# Patient Record
Sex: Female | Born: 1970 | Race: Black or African American | Hispanic: No | Marital: Married | Smoking: Never smoker
Health system: Southern US, Community
[De-identification: ages and names within clinical notes are randomized; demographics above are authoritative.]

## PROBLEM LIST (undated history)

## (undated) DIAGNOSIS — T7840XA Allergy, unspecified, initial encounter: Secondary | ICD-10-CM

## (undated) DIAGNOSIS — F319 Bipolar disorder, unspecified: Secondary | ICD-10-CM

## (undated) DIAGNOSIS — R51 Headache: Secondary | ICD-10-CM

## (undated) DIAGNOSIS — F329 Major depressive disorder, single episode, unspecified: Secondary | ICD-10-CM

## (undated) DIAGNOSIS — E785 Hyperlipidemia, unspecified: Secondary | ICD-10-CM

## (undated) DIAGNOSIS — R7303 Prediabetes: Secondary | ICD-10-CM

## (undated) DIAGNOSIS — K5900 Constipation, unspecified: Secondary | ICD-10-CM

## (undated) DIAGNOSIS — A419 Sepsis, unspecified organism: Secondary | ICD-10-CM

## (undated) DIAGNOSIS — K589 Irritable bowel syndrome without diarrhea: Secondary | ICD-10-CM

## (undated) DIAGNOSIS — M2550 Pain in unspecified joint: Secondary | ICD-10-CM

## (undated) DIAGNOSIS — E559 Vitamin D deficiency, unspecified: Secondary | ICD-10-CM

## (undated) DIAGNOSIS — N390 Urinary tract infection, site not specified: Secondary | ICD-10-CM

## (undated) DIAGNOSIS — N159 Renal tubulo-interstitial disease, unspecified: Secondary | ICD-10-CM

## (undated) DIAGNOSIS — M5124 Other intervertebral disc displacement, thoracic region: Secondary | ICD-10-CM

## (undated) DIAGNOSIS — K219 Gastro-esophageal reflux disease without esophagitis: Secondary | ICD-10-CM

## (undated) DIAGNOSIS — E739 Lactose intolerance, unspecified: Secondary | ICD-10-CM

## (undated) DIAGNOSIS — F419 Anxiety disorder, unspecified: Secondary | ICD-10-CM

## (undated) DIAGNOSIS — J45909 Unspecified asthma, uncomplicated: Secondary | ICD-10-CM

## (undated) DIAGNOSIS — G8929 Other chronic pain: Secondary | ICD-10-CM

## (undated) DIAGNOSIS — F32A Depression, unspecified: Secondary | ICD-10-CM

## (undated) DIAGNOSIS — R0600 Dyspnea, unspecified: Secondary | ICD-10-CM

## (undated) DIAGNOSIS — N189 Chronic kidney disease, unspecified: Secondary | ICD-10-CM

## (undated) DIAGNOSIS — R519 Other chronic pain: Secondary | ICD-10-CM

## (undated) DIAGNOSIS — F988 Other specified behavioral and emotional disorders with onset usually occurring in childhood and adolescence: Secondary | ICD-10-CM

## (undated) HISTORY — DX: Anxiety disorder, unspecified: F41.9

## (undated) HISTORY — DX: Headache: R51

## (undated) HISTORY — DX: Other chronic pain: R51.9

## (undated) HISTORY — DX: Hyperlipidemia, unspecified: E78.5

## (undated) HISTORY — DX: Lactose intolerance, unspecified: E73.9

## (undated) HISTORY — DX: Dyspnea, unspecified: R06.00

## (undated) HISTORY — PX: COLONOSCOPY: SHX174

## (undated) HISTORY — DX: Other intervertebral disc displacement, thoracic region: M51.24

## (undated) HISTORY — DX: Vitamin D deficiency, unspecified: E55.9

## (undated) HISTORY — DX: Depression, unspecified: F32.A

## (undated) HISTORY — PX: BREAST SURGERY: SHX581

## (undated) HISTORY — DX: Bipolar disorder, unspecified: F31.9

## (undated) HISTORY — PX: KIDNEY STONE SURGERY: SHX686

## (undated) HISTORY — DX: Pain in unspecified joint: M25.50

## (undated) HISTORY — PX: TUBAL LIGATION: SHX77

## (undated) HISTORY — PX: LIPOMA EXCISION: SHX5283

## (undated) HISTORY — DX: Other specified behavioral and emotional disorders with onset usually occurring in childhood and adolescence: F98.8

## (undated) HISTORY — DX: Other chronic pain: G89.29

## (undated) HISTORY — DX: Constipation, unspecified: K59.00

## (undated) HISTORY — DX: Allergy, unspecified, initial encounter: T78.40XA

## (undated) HISTORY — DX: Prediabetes: R73.03

## (undated) HISTORY — DX: Chronic kidney disease, unspecified: N18.9

## (undated) HISTORY — PX: BREAST EXCISIONAL BIOPSY: SUR124

## (undated) HISTORY — DX: Major depressive disorder, single episode, unspecified: F32.9

## (undated) HISTORY — DX: Irritable bowel syndrome, unspecified: K58.9

## (undated) HISTORY — PX: OTHER SURGICAL HISTORY: SHX169

## (undated) HISTORY — PX: MOUTH SURGERY: SHX715

---

## 2000-01-19 ENCOUNTER — Other Ambulatory Visit: Admission: RE | Admit: 2000-01-19 | Discharge: 2000-01-19 | Payer: Self-pay | Admitting: *Deleted

## 2001-03-10 ENCOUNTER — Other Ambulatory Visit: Admission: RE | Admit: 2001-03-10 | Discharge: 2001-03-10 | Payer: Self-pay | Admitting: *Deleted

## 2002-04-24 ENCOUNTER — Other Ambulatory Visit: Admission: RE | Admit: 2002-04-24 | Discharge: 2002-04-24 | Payer: Self-pay | Admitting: *Deleted

## 2002-12-21 ENCOUNTER — Other Ambulatory Visit: Admission: RE | Admit: 2002-12-21 | Discharge: 2002-12-21 | Payer: Self-pay | Admitting: Gynecology

## 2003-04-20 HISTORY — PX: LEEP: SHX91

## 2004-04-27 ENCOUNTER — Other Ambulatory Visit: Admission: RE | Admit: 2004-04-27 | Discharge: 2004-04-27 | Payer: Self-pay | Admitting: Gynecology

## 2004-06-30 ENCOUNTER — Ambulatory Visit: Payer: Self-pay | Admitting: Family Medicine

## 2004-08-07 ENCOUNTER — Ambulatory Visit: Payer: Self-pay | Admitting: Family Medicine

## 2004-08-12 ENCOUNTER — Encounter: Admission: RE | Admit: 2004-08-12 | Discharge: 2004-08-12 | Payer: Self-pay | Admitting: Family Medicine

## 2004-12-14 ENCOUNTER — Other Ambulatory Visit: Admission: RE | Admit: 2004-12-14 | Discharge: 2004-12-14 | Payer: Self-pay | Admitting: Gynecology

## 2005-06-30 ENCOUNTER — Emergency Department (HOSPITAL_COMMUNITY): Admission: EM | Admit: 2005-06-30 | Discharge: 2005-06-30 | Payer: Self-pay | Admitting: *Deleted

## 2005-08-12 ENCOUNTER — Ambulatory Visit: Payer: Self-pay | Admitting: Family Medicine

## 2005-12-08 ENCOUNTER — Ambulatory Visit: Payer: Self-pay | Admitting: Family Medicine

## 2006-01-19 ENCOUNTER — Ambulatory Visit: Payer: Self-pay | Admitting: Family Medicine

## 2006-01-25 ENCOUNTER — Ambulatory Visit: Payer: Self-pay | Admitting: Licensed Clinical Social Worker

## 2006-01-31 ENCOUNTER — Ambulatory Visit: Payer: Self-pay | Admitting: Licensed Clinical Social Worker

## 2006-04-06 ENCOUNTER — Other Ambulatory Visit: Admission: RE | Admit: 2006-04-06 | Discharge: 2006-04-06 | Payer: Self-pay | Admitting: Gynecology

## 2006-06-22 ENCOUNTER — Ambulatory Visit: Payer: Self-pay | Admitting: Family Medicine

## 2006-07-01 ENCOUNTER — Ambulatory Visit: Payer: Self-pay | Admitting: Internal Medicine

## 2006-07-19 ENCOUNTER — Ambulatory Visit: Payer: Self-pay | Admitting: Family Medicine

## 2006-07-19 ENCOUNTER — Encounter: Payer: Self-pay | Admitting: Family Medicine

## 2006-07-19 DIAGNOSIS — R110 Nausea: Secondary | ICD-10-CM | POA: Insufficient documentation

## 2006-07-19 DIAGNOSIS — E785 Hyperlipidemia, unspecified: Secondary | ICD-10-CM

## 2006-07-19 DIAGNOSIS — E782 Mixed hyperlipidemia: Secondary | ICD-10-CM | POA: Insufficient documentation

## 2006-08-08 ENCOUNTER — Ambulatory Visit: Payer: Self-pay | Admitting: Gastroenterology

## 2006-08-08 LAB — CONVERTED CEMR LAB
ALT: 13 units/L (ref 0–40)
AST: 23 units/L (ref 0–37)
Basophils Absolute: 0 10*3/uL (ref 0.0–0.1)
CO2: 30 meq/L (ref 19–32)
Chloride: 103 meq/L (ref 96–112)
Eosinophils Absolute: 0.1 10*3/uL (ref 0.0–0.6)
GFR calc Af Amer: 122 mL/min
GFR calc non Af Amer: 101 mL/min
Lymphocytes Relative: 41.4 % (ref 12.0–46.0)
MCHC: 34.1 g/dL (ref 30.0–36.0)
MCV: 87.7 fL (ref 78.0–100.0)
Monocytes Relative: 5.6 % (ref 3.0–11.0)
Platelets: 251 10*3/uL (ref 150–400)
Potassium: 3.7 meq/L (ref 3.5–5.1)
RBC: 4.21 M/uL (ref 3.87–5.11)
RDW: 11.4 % — ABNORMAL LOW (ref 11.5–14.6)
Total Protein: 6.7 g/dL (ref 6.0–8.3)

## 2006-08-15 ENCOUNTER — Encounter: Payer: Self-pay | Admitting: Gastroenterology

## 2006-10-25 ENCOUNTER — Ambulatory Visit: Payer: Self-pay | Admitting: Family Medicine

## 2006-10-25 DIAGNOSIS — F329 Major depressive disorder, single episode, unspecified: Secondary | ICD-10-CM | POA: Insufficient documentation

## 2006-10-25 DIAGNOSIS — F32A Depression, unspecified: Secondary | ICD-10-CM | POA: Insufficient documentation

## 2006-10-28 LAB — CONVERTED CEMR LAB
ALT: 12 units/L (ref 0–35)
AST: 18 units/L (ref 0–37)
Alkaline Phosphatase: 66 units/L (ref 39–117)
Basophils Relative: 0.2 % (ref 0.0–1.0)
Bilirubin, Direct: 0.1 mg/dL (ref 0.0–0.3)
Calcium: 9.4 mg/dL (ref 8.4–10.5)
Direct LDL: 172.5 mg/dL
Eosinophils Absolute: 0.1 10*3/uL (ref 0.0–0.6)
Eosinophils Relative: 1.3 % (ref 0.0–5.0)
GFR calc Af Amer: 91 mL/min
Glucose, Bld: 85 mg/dL (ref 70–99)
Hemoglobin: 13.8 g/dL (ref 12.0–15.0)
MCHC: 34.9 g/dL (ref 30.0–36.0)
MCV: 86 fL (ref 78.0–100.0)
Monocytes Absolute: 0.3 10*3/uL (ref 0.2–0.7)
Monocytes Relative: 5.7 % (ref 3.0–11.0)
Potassium: 3.6 meq/L (ref 3.5–5.1)
Sodium: 142 meq/L (ref 135–145)
Total CHOL/HDL Ratio: 5.2
VLDL: 17 mg/dL (ref 0–40)
WBC: 5 10*3/uL (ref 4.5–10.5)

## 2007-03-05 ENCOUNTER — Emergency Department (HOSPITAL_COMMUNITY): Admission: EM | Admit: 2007-03-05 | Discharge: 2007-03-05 | Payer: Self-pay | Admitting: Emergency Medicine

## 2008-01-15 ENCOUNTER — Emergency Department (HOSPITAL_COMMUNITY): Admission: EM | Admit: 2008-01-15 | Discharge: 2008-01-15 | Payer: Self-pay | Admitting: Family Medicine

## 2008-05-08 ENCOUNTER — Encounter: Payer: Self-pay | Admitting: Family Medicine

## 2008-05-31 ENCOUNTER — Ambulatory Visit: Payer: Self-pay | Admitting: Family Medicine

## 2008-05-31 DIAGNOSIS — R7989 Other specified abnormal findings of blood chemistry: Secondary | ICD-10-CM | POA: Insufficient documentation

## 2008-05-31 DIAGNOSIS — D1739 Benign lipomatous neoplasm of skin and subcutaneous tissue of other sites: Secondary | ICD-10-CM | POA: Insufficient documentation

## 2008-05-31 DIAGNOSIS — Z87442 Personal history of urinary calculi: Secondary | ICD-10-CM | POA: Insufficient documentation

## 2008-06-10 LAB — CONVERTED CEMR LAB
Basophils Absolute: 0 10*3/uL (ref 0.0–0.1)
Basophils Relative: 0.4 % (ref 0.0–3.0)
Iron: 104 ug/dL (ref 42–145)
Lymphocytes Relative: 42.5 % (ref 12.0–46.0)
MCV: 88.4 fL (ref 78.0–100.0)
Monocytes Absolute: 0.3 10*3/uL (ref 0.1–1.0)
Monocytes Relative: 6.5 % (ref 3.0–12.0)
Neutrophils Relative %: 49.4 % (ref 43.0–77.0)
WBC: 4.6 10*3/uL (ref 4.5–10.5)

## 2008-06-11 ENCOUNTER — Encounter (INDEPENDENT_AMBULATORY_CARE_PROVIDER_SITE_OTHER): Payer: Self-pay | Admitting: *Deleted

## 2008-07-01 ENCOUNTER — Encounter: Payer: Self-pay | Admitting: Women's Health

## 2008-07-01 ENCOUNTER — Other Ambulatory Visit: Admission: RE | Admit: 2008-07-01 | Discharge: 2008-07-01 | Payer: Self-pay | Admitting: Gynecology

## 2008-07-01 ENCOUNTER — Ambulatory Visit: Payer: Self-pay | Admitting: Women's Health

## 2008-07-10 ENCOUNTER — Encounter: Admission: RE | Admit: 2008-07-10 | Discharge: 2008-07-10 | Payer: Self-pay | Admitting: Gynecology

## 2008-07-16 ENCOUNTER — Encounter: Admission: RE | Admit: 2008-07-16 | Discharge: 2008-07-16 | Payer: Self-pay | Admitting: Gynecology

## 2008-08-02 ENCOUNTER — Ambulatory Visit: Payer: Self-pay | Admitting: Family Medicine

## 2008-08-02 DIAGNOSIS — IMO0002 Reserved for concepts with insufficient information to code with codable children: Secondary | ICD-10-CM | POA: Insufficient documentation

## 2008-08-05 ENCOUNTER — Telehealth (INDEPENDENT_AMBULATORY_CARE_PROVIDER_SITE_OTHER): Payer: Self-pay | Admitting: *Deleted

## 2008-08-06 ENCOUNTER — Encounter: Admission: RE | Admit: 2008-08-06 | Discharge: 2008-08-06 | Payer: Self-pay | Admitting: Family Medicine

## 2008-08-07 ENCOUNTER — Telehealth (INDEPENDENT_AMBULATORY_CARE_PROVIDER_SITE_OTHER): Payer: Self-pay | Admitting: *Deleted

## 2008-08-14 ENCOUNTER — Encounter: Payer: Self-pay | Admitting: Family Medicine

## 2008-09-24 ENCOUNTER — Telehealth (INDEPENDENT_AMBULATORY_CARE_PROVIDER_SITE_OTHER): Payer: Self-pay | Admitting: *Deleted

## 2008-09-25 ENCOUNTER — Encounter: Payer: Self-pay | Admitting: Family Medicine

## 2008-10-01 ENCOUNTER — Ambulatory Visit: Payer: Self-pay | Admitting: Family Medicine

## 2008-11-12 ENCOUNTER — Ambulatory Visit: Payer: Self-pay | Admitting: Family Medicine

## 2008-11-12 DIAGNOSIS — R5383 Other fatigue: Secondary | ICD-10-CM

## 2008-11-12 DIAGNOSIS — R5381 Other malaise: Secondary | ICD-10-CM | POA: Insufficient documentation

## 2008-11-12 DIAGNOSIS — IMO0001 Reserved for inherently not codable concepts without codable children: Secondary | ICD-10-CM | POA: Insufficient documentation

## 2008-11-17 LAB — CONVERTED CEMR LAB
BUN: 8 mg/dL (ref 6–23)
Basophils Relative: 0.6 % (ref 0.0–3.0)
Creatinine, Ser: 0.8 mg/dL (ref 0.4–1.2)
Eosinophils Absolute: 0.1 10*3/uL (ref 0.0–0.7)
Eosinophils Relative: 1.7 % (ref 0.0–5.0)
Folate: 8.5 ng/mL
Glucose, Bld: 90 mg/dL (ref 70–99)
Hemoglobin: 13 g/dL (ref 12.0–15.0)
Lymphs Abs: 2.2 10*3/uL (ref 0.7–4.0)
Monocytes Relative: 5.8 % (ref 3.0–12.0)
Neutro Abs: 4.1 10*3/uL (ref 1.4–7.7)
Neutrophils Relative %: 59.2 % (ref 43.0–77.0)
Platelets: 253 10*3/uL (ref 150.0–400.0)
RDW: 11.9 % (ref 11.5–14.6)
Sodium: 138 meq/L (ref 135–145)

## 2008-11-20 ENCOUNTER — Telehealth (INDEPENDENT_AMBULATORY_CARE_PROVIDER_SITE_OTHER): Payer: Self-pay | Admitting: *Deleted

## 2008-11-26 ENCOUNTER — Ambulatory Visit: Payer: Self-pay | Admitting: Family Medicine

## 2008-11-28 ENCOUNTER — Encounter (INDEPENDENT_AMBULATORY_CARE_PROVIDER_SITE_OTHER): Payer: Self-pay | Admitting: *Deleted

## 2008-11-28 LAB — CONVERTED CEMR LAB: Sed Rate: 12 mm/hr (ref 0–22)

## 2009-01-06 ENCOUNTER — Ambulatory Visit: Payer: Self-pay | Admitting: Family Medicine

## 2009-01-06 DIAGNOSIS — E663 Overweight: Secondary | ICD-10-CM | POA: Insufficient documentation

## 2009-02-24 ENCOUNTER — Ambulatory Visit: Payer: Self-pay | Admitting: Family Medicine

## 2009-03-03 ENCOUNTER — Emergency Department (HOSPITAL_COMMUNITY): Admission: EM | Admit: 2009-03-03 | Discharge: 2009-03-03 | Payer: Self-pay | Admitting: Family Medicine

## 2009-04-28 ENCOUNTER — Ambulatory Visit: Payer: Self-pay | Admitting: Family Medicine

## 2009-04-28 DIAGNOSIS — F988 Other specified behavioral and emotional disorders with onset usually occurring in childhood and adolescence: Secondary | ICD-10-CM | POA: Insufficient documentation

## 2009-05-21 ENCOUNTER — Telehealth: Payer: Self-pay | Admitting: Family Medicine

## 2009-07-04 ENCOUNTER — Ambulatory Visit: Payer: Self-pay | Admitting: Family Medicine

## 2009-07-09 ENCOUNTER — Encounter: Payer: Self-pay | Admitting: Family Medicine

## 2009-07-09 LAB — CONVERTED CEMR LAB: Pap Smear: NORMAL

## 2009-07-11 ENCOUNTER — Other Ambulatory Visit: Admission: RE | Admit: 2009-07-11 | Discharge: 2009-07-11 | Payer: Self-pay | Admitting: Gynecology

## 2009-07-11 ENCOUNTER — Ambulatory Visit: Payer: Self-pay | Admitting: Women's Health

## 2009-10-29 ENCOUNTER — Telehealth (INDEPENDENT_AMBULATORY_CARE_PROVIDER_SITE_OTHER): Payer: Self-pay | Admitting: *Deleted

## 2010-01-12 ENCOUNTER — Ambulatory Visit: Payer: Self-pay | Admitting: Family Medicine

## 2010-01-12 DIAGNOSIS — B009 Herpesviral infection, unspecified: Secondary | ICD-10-CM | POA: Insufficient documentation

## 2010-03-06 ENCOUNTER — Telehealth: Payer: Self-pay | Admitting: Family Medicine

## 2010-05-10 ENCOUNTER — Encounter: Payer: Self-pay | Admitting: Family Medicine

## 2010-05-13 ENCOUNTER — Telehealth (INDEPENDENT_AMBULATORY_CARE_PROVIDER_SITE_OTHER): Payer: Self-pay | Admitting: *Deleted

## 2010-05-21 NOTE — Progress Notes (Signed)
Summary: Psych's   Phone Note Outgoing Call   Summary of Call: Pt would like a list of Psych's faxed to her- is this okay to do. She is going out of town [address]. I informed pt, i informed her she could pick up her rx tomorrow. Pt is aware. Army Fossa CMA  May 21, 2009 10:24 AM   Follow-up for Phone Call        yes Follow-up by: Loreen Freud DO,  May 21, 2009 10:32 AM  Additional Follow-up for Phone Call Additional follow up Details #1::        faxed list to [phone]. Army Fossa CMA  May 21, 2009 10:33 AM     Prescriptions: ADDERALL XR 20 MG XR24H-CAP (AMPHETAMINE-DEXTROAMPHETAMINE) 1 by mouth qam  #30 x 0   Entered by:   Army Fossa CMA   Authorized by:   Loreen Freud DO   Signed by:   Army Fossa CMA on 05/21/2009   Method used:   Print then Give to Patient   RxID:   [id number]   Appended Document: Psych's  faxed to [phone]

## 2010-05-21 NOTE — Progress Notes (Signed)
Summary: rx  Phone Note Refill Request Call back at Home Phone [phone] Message from:  Patient  Refills Requested: Medication #1:  VYVANSE 30 MG CAPS 1 by mouth once daily. need a new script for vyvanse 30 mg   Initial call taken by: Freddy Jaksch,  May 13, 2010 3:59 PM  Follow-up for Phone Call        spoke with patient and I made her aware a f/u is due now and Adderall is avail, stated she wanted to switch back to Adderall...Marland KitchenMarland KitchenRx printed  Follow-up by: Almeta Monas CMA (AAMA),  May 14, 2010 1:25 PM    Prescriptions: ADDERALL 20 MG TABS (AMPHETAMINE-DEXTROAMPHETAMINE) 1 by mouth two times a day  #60 x 0   Entered by:   Almeta Monas CMA (AAMA)   Authorized by:   Loreen Freud DO   Signed by:   Almeta Monas CMA (AAMA) on 05/14/2010   Method used:   Print then Give to Patient   RxID:   [id number]

## 2010-05-21 NOTE — Assessment & Plan Note (Signed)
Summary: WANTS TO TALK ABOUT MEDICINE///SPH   Vital Signs:  Patient profile:   40 year old female Weight:      188 pounds Temp:     99.6 degrees F oral Pulse rate:   78 / minute Pulse rhythm:   regular BP sitting:   122 / 80  (left arm) Cuff size:   regular  Vitals Entered By: Army Fossa CMA (April 28, 2009 3:11 PM) CC: Discuss medicine- having a hard time focusings. REfill on Adipex.    History of Present Illness: Pt here to discuss Adult ADD.  Pt is having trouble focusing at work.    Her boss even said something to her.      Current Medications (verified): 1)  Adderall Xr 20 Mg Xr24h-Cap (Amphetamine-Dextroamphetamine) .Marland Kitchen.. 1 By Mouth Qam  Allergies: 1)  ! Codeine  Past History:  Past medical, surgical, family and social histories (including risk factors) reviewed for relevance to current acute and chronic problems.  Past Medical History: Reviewed history from 05/31/2008 and no changes required. Hyperlipidemia Depression--[facility]osp [facility]--suicidal ideation  Past Surgical History: Reviewed history from 05/31/2008 and no changes required. Tubal ligation Lumpectomy LEEP 2005  Family History: Reviewed history from 10/25/2006 and no changes required. Family History Hypertension Pgm- cva pAunt- breast CA maunt-stomach ca/ uterine ca maunt-brain ca mgf- diabetes  Social History: Reviewed history from 10/25/2006 and no changes required. 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 Lungs are clear to auscultation, no crackles or wheezes. Heart:  Normal rate and regular rhythm. S1 and S2 normal without gallop, murmur, click, rub or other extra  sounds. Psych:  Cognition and judgment appear intact. Alert and cooperative with normal attention span and concentration. 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 1 by mouth qam Prescriptions: ADDERALL XR 20 MG XR24H-CAP (AMPHETAMINE-DEXTROAMPHETAMINE) 1 by mouth qam  #30 x 0   Entered and Authorized by:   Loreen Freud DO   Signed by:   Loreen Freud DO on 04/28/2009   Method used:   Print then Give to Patient   RxID:   [id number]

## 2010-05-21 NOTE — Assessment & Plan Note (Signed)
Summary: FU ON ADDERRAL/KDC   Vital Signs:  Patient profile:   40 year old female Weight:      175 pounds Temp:     98.9 degrees F oral Pulse rate:   80 / minute BP sitting:   100 / 62  (left arm)  Vitals Entered By: Jeremy Johann CMA (July 04, 2009 12:48 PM) CC: f/u med Comments REVIEWED MED LIST, PATIENT AGREED DOSE AND INSTRUCTION CORRECT    History of Present Illness: Pt here f/u ADD.  She would like to change to Adderall immediate release.  No Side effects.     Allergies: 1)  ! Codeine  Past History:  Past medical, surgical, family and social histories (including risk factors) reviewed for relevance to current acute and chronic problems.  Past Medical History: Reviewed history from 05/31/2008 and no changes required. Hyperlipidemia Depression--Hosp 1993 [facility]--suicidal ideation  Past Surgical History: Reviewed history from 05/31/2008 and no changes required. Tubal ligation Lumpectomy LEEP 2005  Family History: Reviewed history from 10/25/2006 and no changes required. Family History Hypertension Pgm- cva pAunt- breast CA maunt-stomach ca/ uterine ca maunt-brain ca mgf- diabetes  Social History: Reviewed history from 10/25/2006 and no changes required. 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 Lungs are clear to auscultation, no crackles or wheezes. Heart:  normal rate and no murmur.   Psych:  Cognition and judgment appear intact. Alert and cooperative with normal attention span and concentration. No apparent delusions, illusions, hallucinations   Impression & Recommendations:  Problem # 1:  ADD (ICD-314.00) change to adderall 20 mg 1 by  mouth once daily  rto 6 months or sooner as needed   Complete Medication List: 1)  Adderall 20 Mg Tabs (Amphetamine-dextroamphetamine) .Marland Kitchen.. 1 by mouth two times a day Prescriptions: ADDERALL 20 MG TABS (AMPHETAMINE-DEXTROAMPHETAMINE) 1 by mouth two times a day  #60 x 0   Entered and Authorized by:   Loreen Freud DO   Signed by:   Loreen Freud DO on 07/04/2009   Method used:   Print then Give to Patient   RxID:   [id number]

## 2010-05-21 NOTE — Progress Notes (Signed)
Summary: unable to get med  Phone Note Call from Patient Call back at [facility] Phone [phone]   Caller: Patient Summary of Call: Pt left VM that she has been unable to find ADDERALL 20 MG TABS at any pharmacy since September. Pt would like to know if it would be easier for her to change back to extended release. pls advise..........................Marland KitchenFelecia Deloach CMA  March 06, 2010 12:20 PM   Follow-up for Phone Call        some of the pharmacys don't have XR either ---she needs to call her pharmacy---- may be easier to change med altogether to ritalin or vyvanse Follow-up by: Loreen Freud DO,  March 06, 2010 12:48 PM  Additional Follow-up for Phone Call Additional follow up Details #1::        wants to switch to Vyvanse... Additional Follow-up by: Almeta Monas CMA Duncan Dull),  March 06, 2010 4:50 PM    Additional Follow-up for Phone Call Additional follow up Details #2::    vyvanse 30 mg #30  1 by mouth once daily  Follow-up by: Loreen Freud DO,  March 06, 2010 4:58 PM  New/Updated Medications: VYVANSE 30 MG CAPS (LISDEXAMFETAMINE DIMESYLATE) 1 by mouth once daily Prescriptions: VYVANSE 30 MG CAPS (LISDEXAMFETAMINE DIMESYLATE) 1 by mouth once daily  #30 x 0   Entered by:   Almeta Monas CMA (AAMA)   Authorized by:   Loreen Freud DO   Signed by:   Almeta Monas CMA (AAMA) on 03/06/2010   Method used:   Print then Give to Patient   RxID:   [id number]

## 2010-05-21 NOTE — Letter (Signed)
Summary: ADHD Checklist/[facility]  ADHD Checklist/[place] [facility]   Imported By: Lanelle Bal 05/06/2009 09:18:13  _____________________________________________________________________  External Attachment:    Type:   Image     Comment:   External Document

## 2010-05-21 NOTE — Progress Notes (Signed)
Summary: Refill Request  Phone Note Refill Request Call back at [phone] Message from:  Patient on October 29, 2009 12:52 PM  Refills Requested: Medication #1:  ADDERALL 20 MG TABS 1 by mouth two times a day.   Dosage confirmed as above?Dosage Confirmed   Supply Requested: 1 month   Last Refilled: 07/04/2009 Pt can be reached at [phone]   Method Requested: Pick up at Office Initial call taken by: Lavell Islam,  October 29, 2009 12:53 PM  Follow-up for Phone Call        left detail message rx ready for pick-up...........Marland KitchenFelecia Deloach CMA  October 29, 2009 1:18 PM     Prescriptions: ADDERALL 20 MG TABS (AMPHETAMINE-DEXTROAMPHETAMINE) 1 by mouth two times a day  #60 x 0   Entered by:   Jeremy Johann CMA   Authorized by:   Loreen Freud DO   Signed by:   Jeremy Johann CMA on 10/29/2009   Method used:   Print then Give to Patient   RxID:   [id number]

## 2010-05-21 NOTE — Assessment & Plan Note (Signed)
Summary: LIPS SWOLLEN/CDJ   Vital Signs:  Patient profile:   40 year old female Height:      67 inches Weight:      174.4 pounds BMI:     27.41 Temp:     98.7 degrees F oral Pulse rate:   88 / minute Pulse rhythm:   regular BP sitting:   108 / 72  (right arm)  Vitals Entered By: Almeta Monas CMA Duncan Dull) (January 12, 2010 2:45 PM) CC: c/o swollen bottom lip   History of Present Illness: Pt here for refill adderall but c/o sore on lip x several days.    Current Medications (verified): 1)  Adderall 20 Mg Tabs (Amphetamine-Dextroamphetamine) .Marland Kitchen.. 1 By Mouth Two Times A Day  Allergies (verified): 1)  ! Codeine  Physical Exam  General:  Well-developed,well-nourished,in no acute distress; alert,appropriate and cooperative throughout examination Mouth:  L side bottom lip--+ pustule Lungs:  Normal respiratory effort, chest expands symmetrically. Lungs are clear to auscultation, no crackles or wheezes. Heart:  Normal rate and regular rhythm. S1 and S2 normal without gallop, murmur, click, rub or other extra sounds. Psych:  Oriented X3 and normally interactive.     Impression & Recommendations:  Problem # 1:  COLD SORE (ICD-054.9) ? secondary infection---triple abx ointment valtrex three times a day for 10 days  Orders: T-Culture, Herpes (Routine) (17408-14481) T-Culture, Wound (87070/[id number])  Problem # 2:  ADD (ICD-314.00) refill adderall  Complete Medication List: 1)  Adderall 20 Mg Tabs (Amphetamine-dextroamphetamine) .Marland Kitchen.. 1 by mouth two times a day 2)  Valtrex 1 Gm Tabs (Valacyclovir hcl) .Marland Kitchen.. 1 by mouth three times a day Prescriptions: VALTREX 1 GM TABS (VALACYCLOVIR HCL) 1 by mouth three times a day  #30 x 0   Entered and Authorized by:   Loreen Freud DO   Signed by:   Loreen Freud DO on 01/12/2010   Method used:   Electronically to        [facility] [address] [phone]* (retail)       [address]       [address]       [place], [place]  [address]     Ph: [phone]       Fax: [phone]   RxID:   [id number] ADDERALL 20 MG TABS (AMPHETAMINE-DEXTROAMPHETAMINE) 1 by mouth two times a day  #60 x 0   Entered by:   Almeta Monas CMA (AAMA)   Authorized by:   Loreen Freud DO   Signed by:   Almeta Monas CMA (AAMA) on 01/12/2010   Method used:   Print then Give to Patient   RxID:   [id number]

## 2010-06-25 ENCOUNTER — Encounter (INDEPENDENT_AMBULATORY_CARE_PROVIDER_SITE_OTHER): Payer: Managed Care, Other (non HMO) | Admitting: Family Medicine

## 2010-06-25 ENCOUNTER — Encounter: Payer: Self-pay | Admitting: Family Medicine

## 2010-06-25 ENCOUNTER — Other Ambulatory Visit: Payer: Self-pay | Admitting: Family Medicine

## 2010-06-25 DIAGNOSIS — E785 Hyperlipidemia, unspecified: Secondary | ICD-10-CM

## 2010-06-25 DIAGNOSIS — Z0000 Encounter for general adult medical examination without abnormal findings: Secondary | ICD-10-CM

## 2010-06-25 LAB — HEPATIC FUNCTION PANEL
ALT: 10 U/L (ref 0–35)
AST: 18 U/L (ref 0–37)
Bilirubin, Direct: 0.1 mg/dL (ref 0.0–0.3)
Total Bilirubin: 0.7 mg/dL (ref 0.3–1.2)

## 2010-06-25 LAB — LIPID PANEL
Total CHOL/HDL Ratio: 4
VLDL: 9.6 mg/dL (ref 0.0–40.0)

## 2010-06-25 LAB — CBC WITH DIFFERENTIAL/PLATELET
Basophils Absolute: 0 10*3/uL (ref 0.0–0.1)
Eosinophils Absolute: 0 10*3/uL (ref 0.0–0.7)
HCT: 38 % (ref 36.0–46.0)
Lymphocytes Relative: 30.9 % (ref 12.0–46.0)
MCV: 88.7 fl (ref 78.0–100.0)
Monocytes Absolute: 0.2 10*3/uL (ref 0.1–1.0)
Platelets: 252 10*3/uL (ref 150.0–400.0)
RBC: 4.28 Mil/uL (ref 3.87–5.11)
RDW: 12.9 % (ref 11.5–14.6)
WBC: 4.9 10*3/uL (ref 4.5–10.5)

## 2010-06-25 LAB — BASIC METABOLIC PANEL
BUN: 8 mg/dL (ref 6–23)
CO2: 28 mEq/L (ref 19–32)
Creatinine, Ser: 0.7 mg/dL (ref 0.4–1.2)
Glucose, Bld: 92 mg/dL (ref 70–99)
Potassium: 4.1 mEq/L (ref 3.5–5.1)
Sodium: 137 mEq/L (ref 135–145)

## 2010-06-25 LAB — CONVERTED CEMR LAB
Blood in Urine, dipstick: NEGATIVE
Nitrite: NEGATIVE
Urobilinogen, UA: NEGATIVE
WBC Urine, dipstick: NEGATIVE

## 2010-06-25 LAB — TSH: TSH: 1 u[IU]/mL (ref 0.35–5.50)

## 2010-06-25 LAB — LDL CHOLESTEROL, DIRECT: Direct LDL: 144.3 mg/dL

## 2010-06-30 NOTE — Assessment & Plan Note (Signed)
Summary: physical/fasting/kn   Vital Signs:  Patient profile:   40 year old female Height:      67.5 inches Weight:      181.6 pounds Pulse rate:   83 / minute Pulse rhythm:   regular BP sitting:   112 / 74  (right arm) Cuff size:   regular  Vitals Entered By: Almeta Monas CMA Duncan Dull) (June 25, 2010 9:41 AM) CC: CPX/FAsting--No pap   History of Present Illness: Pt here for cpe and labs.  She has gyn--NP Maryelizabeth Rowan.   No complaints.    Preventive Screening-Counseling & Management  Alcohol-Tobacco     Alcohol drinks/day: 0     Smoking Status: never  Caffeine-Diet-Exercise     Caffeine use/day: 1     Does Patient Exercise: yes     Type of exercise: workout dvd     Times/week: <3     Exercise Counseling: to improve exercise regimen  Hep-HIV-STD-Contraception     Dental Visit-last 6 months yes     Dental Care Counseling: not indicated; dental care within six months     SBE monthly: yes     SBE Education/Counseling: not indicated; SBE done regularly  Safety-Violence-Falls     Seat Belt Use: 100  Problems Prior to Update: 1)  Cold Sore  (ICD-054.9) 2)  Add  (ICD-314.00) 3)  Overweight  (ICD-278.02) 4)  Fatigue  (ICD-780.79) 5)  Myalgia  (ICD-729.1) 6)  Back Pain With Radiculopathy  (ICD-729.2) 7)  Other Abnormal Blood Chemistry  (ICD-790.6) 8)  Preventive Health Care  (ICD-V70.0) 9)  Nephrolithiasis, Hx of  (ICD-V13.01) 10)  Hx of Lipoma, Skin  (ICD-214.1) 11)  Preventive Health Care  (ICD-V70.0) 12)  Depression  (ICD-311) 13)  Nausea, Chronic  (ICD-787.02) 14)  Hyperlipidemia  (ICD-272.4)  Medications Prior to Update: 1)  Adderall 20 Mg Tabs (Amphetamine-Dextroamphetamine) .Marland Kitchen.. 1 By Mouth Two Times A Day 2)  Keflex 500 Mg Caps (Cephalexin) .Marland Kitchen.. 1 By Mouth Two Times A Day X10 Days 3)  Diflucan 150 Mg Tabs (Fluconazole) .... By Mouth As Needed 4)  Vyvanse 30 Mg Caps (Lisdexamfetamine Dimesylate) .Marland Kitchen.. 1 By Mouth Once Daily  Current Medications (verified): 1)   Adderall 20 Mg Tabs (Amphetamine-Dextroamphetamine) .Marland Kitchen.. 1 By Mouth Two Times A Day  Allergies (verified): 1)  ! Codeine  Past History:  Past Medical History: Last updated: 05/31/2008 Hyperlipidemia Depression--[facility]osp 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 Family History Hypertension Pgm- cva pAunt- breast CA maunt-stomach ca/ uterine ca maunt-brain ca mgf- diabetes  Social History: Reviewed history from 10/25/2006 and no changes required. 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 Eyes:  Denies blurring, discharge, double vision, eye irritation, eye pain, halos, itching, light sensitivity, red eye, vision loss-1 eye, and vision loss-both eyes;  optho q 1y. ENT:  Denies decreased hearing, difficulty swallowing, ear discharge, earache, hoarseness, nasal congestion, nosebleeds, postnasal drainage, ringing in ears, sinus pressure, and sore throat. CV:  Denies bluish discoloration of lips or nails, chest pain or discomfort, difficulty breathing at night, difficulty breathing while lying down, fainting,  fatigue, leg cramps with exertion, lightheadness, near fainting, palpitations, shortness of breath with exertion, swelling of feet, swelling of hands, and weight gain. Resp:  Denies chest discomfort, chest pain with inspiration, cough, coughing up blood, excessive snoring, hypersomnolence, morning headaches, pleuritic, shortness of breath, sputum productive, and wheezing. GI:  Denies abdominal pain, bloody stools, change in bowel habits, constipation, dark tarry stools, diarrhea, excessive appetite, gas, hemorrhoids, indigestion, loss of appetite, nausea, vomiting, vomiting blood, and yellowish skin color. GU:  Denies abnormal vaginal bleeding, decreased libido, discharge, dysuria, genital sores, hematuria, incontinence, nocturia, urinary frequency, and urinary hesitancy. MS:  Denies joint pain, joint redness, joint swelling, loss of strength, low back pain, mid back pain, muscle aches, muscle , cramps, muscle weakness, stiffness, and thoracic pain. Derm:  Denies changes in color of skin, changes in nail beds, dryness, excessive perspiration, flushing, hair loss, insect bite(s), itching, lesion(s), poor wound healing, and rash. Neuro:  Denies brief paralysis, difficulty with concentration, disturbances in coordination, falling down, headaches, inability to speak, memory loss, numbness, poor balance, seizures, sensation of room spinning, tingling, tremors, visual disturbances, and weakness. Psych:  Denies alternate hallucination ( auditory/visual), anxiety, depression, easily angered, easily tearful, irritability, mental problems, panic attacks, sense of great danger, suicidal thoughts/plans, thoughts of violence, unusual visions or sounds, and thoughts /plans of harming others. Endo:  Denies cold intolerance, excessive hunger, excessive thirst, excessive urination, heat intolerance, polyuria, and weight change. Heme:  Denies abnormal bruising, bleeding, enlarge lymph nodes, fevers, pallor, and skin  discoloration. Allergy:  Denies hives or rash, itching eyes, persistent infections, seasonal allergies, and sneezing.  Physical Exam  General:  Well-developed,well-nourished,in no acute distress; alert,appropriate and cooperative throughout examination Head:  Normocephalic and atraumatic without obvious abnormalities. No apparent alopecia or balding. Eyes:  pupils equal, pupils round, pupils reactive to light, and no injection.   Ears:  External ear exam shows no significant lesions or deformities.  Otoscopic examination reveals clear canals, tympanic membranes are intact bilaterally without bulging, retraction, inflammation or discharge. Hearing is grossly normal bilaterally. Nose:  External nasal examination shows no deformity or inflammation. Nasal mucosa are pink and moist without lesions or exudates. Mouth:  Oral mucosa and oropharynx without lesions or exudates.  Teeth in good repair. Neck:  No deformities, masses, or tenderness noted.no carotid bruits.   Chest Wall:  No deformities, masses, or tenderness noted. Breasts:  gyn Lungs:  Normal respiratory effort, chest expands symmetrically. Lungs are clear to auscultation, no crackles or wheezes. Heart:  normal rate and no murmur.   Abdomen:  Bowel sounds positive,abdomen soft and non-tender without masses, organomegaly or hernias noted. Rectal:  gyn Genitalia:  gyn Msk:  normal ROM, no joint tenderness, no joint swelling, no joint warmth, no redness over joints, no joint deformities, no joint instability, and no crepitation.   Pulses:  R and L carotid,radial,femoral,dorsalis pedis and posterior tibial pulses are full and equal bilaterally Extremities:  No clubbing, cyanosis, edema, or deformity noted with normal full range of motion of all joints.   Neurologic:  No cranial nerve deficits noted. Station and gait are normal. Plantar reflexes are down-going bilaterally. DTRs are symmetrical throughout. Sensory, motor and coordinative  functions appear intact. Skin:  Intact without suspicious lesions or rashes Cervical Nodes:  No lymphadenopathy noted Axillary Nodes:  No palpable lymphadenopathy Psych:  Cognition and judgment appear intact. Alert and cooperative with normal attention span and concentration. No apparent delusions, illusions, hallucinations   Impression & Recommendations:  Problem # 1:  PREVENTIVE HEALTH CARE (ICD-V70.0) ghm utd  Orders: Venipuncture (04540) TLB-Lipid Panel (80061-LIPID) TLB-BMP (Basic Metabolic Panel-BMET) (80048-METABOL) TLB-CBC Platelet - w/Differential (85025-CBCD) TLB-Hepatic/Liver Function Pnl (80076-HEPATIC) TLB-TSH (Thyroid Stimulating Hormone) (84443-TSH) EKG w/ Interpretation (93000) Specimen Handling (98119) UA Dipstick W/ Micro (manual) (81000)  Problem # 2:  ADD (ICD-314.00)  Complete Medication List: 1)  Adderall 20 Mg Tabs (Amphetamine-dextroamphetamine) .Marland Kitchen.. 1 by mouth two times a day   Orders Added: 1)  Venipuncture [36415] 2)  TLB-Lipid Panel [80061-LIPID] 3)  TLB-BMP (Basic Metabolic Panel-BMET) [80048-METABOL] 4)  TLB-CBC Platelet - w/Differential [85025-CBCD] 5)  TLB-Hepatic/Liver Function Pnl [80076-HEPATIC] 6)  TLB-TSH (Thyroid Stimulating Hormone) [84443-TSH] 7)  Est. Patient 40-64 years [99396] 8)  EKG w/ Interpretation [93000] 9)  Specimen Handling [99000] 10)  UA Dipstick W/ Micro (manual) [81000]    Flu Vaccine Next Due:  Refused Last PAP:  Normal (06/22/2006 1:28:16 PM) PAP Result Date:  07/09/2009 PAP Result:  normal PAP Next Due:  1 yr Mammogram Result Date:  08/13/2009 Mammogram Result:  normal--Fibrocystic Mammogram Next Due:  1 yr  Laboratory Results   Urine Tests   Date/Time Reported: June 25, 2010 10:58 AM   Routine Urinalysis   Color: yellow Appearance: Clear Glucose: negative   (Normal Range: Negative) Bilirubin: negative   (Normal Range: Negative) Ketone: negative   (Normal Range: Negative) Spec. Gravity: 1.010    (Normal Range: 1.003-1.035) Blood: negative   (Normal Range: Negative) pH: 7.5   (Normal Range: 5.0-8.0) Protein: trace   (Normal Range: Negative) Urobilinogen: negative   (Normal Range: 0-1) Nitrite: negative   (Normal Range: Negative) Leukocyte Esterace: negative   (Normal Range: Negative)    Comments: Floydene Flock  June 25, 2010 10:58 AM

## 2010-07-22 LAB — CULTURE, ROUTINE-ABSCESS

## 2010-09-04 NOTE — Assessment & Plan Note (Signed)
[facility]                         GASTROENTEROLOGY OFFICE NOTE   Cheryl Patterson, Cheryl Patterson                    MRN:          [id number]  DATE:08/08/2006                            DOB:          08-17-1970    REASON FOR REFERRAL:  Dr. Laury Axon asked me to evaluate Cheryl Patterson in  consultation regarding chronic upper and lower GI symptoms.   HISTORY OF PRESENT ILLNESS:  Cheryl Patterson is a very pleasant 40 year old  women who has had approximately 1-2 years of upper and lower GI  symptoms.  Specifically, she has very occasional every 2-3 month  episodes of abdominal pain she describes as feeling like her stomach is  twisting.  This will be followed by vomiting and diarrhea at the same  time.  This happens every 2-3 months on average.  She is not aware of  upper or lower GI bleeding because she does not really look at it.  The  symptoms last for half an hour or an hour and after she has one bathroom  visit the symptoms are usually gone.  She thought this was lactose  intolerance at first, but then had a more recent episode in December  that she does not recall eating any dairy prior to this.   More recently for the last 2-3 months she has had once weekly nausea or  vomiting and has been fairly constipated.  She moves her bowels  approximately once a week and when she does move them she has to strain,  at first it is hard and then it is followed by very loose stools.  She  tried increasing her dietary fibre, but that has not seemed to make a  difference.  She did try a 30-day colon cleanse program during that and  for about a month afterwards she said her bowels were moving very well.   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   PAST MEDICAL HISTORY:  Elevated cholesterol, anxiety, depression, kidney  stones,  chronic headaches, tubal ligation, breast surgery a cyst was  removed.   CURRENT MEDICATIONS:  1. Nexium once daily, this is a new medicine she has not noticed much      of a difference since being on that.  2. Multivitamin once daily.   ALLERGIES:  CODEINE.   SOCIAL HISTORY:  Nonsmoker, nondrinker, drinks 1-2 large iced coffees  from [employer] on a daily basis.  No other caffeine intake.  She is  married with 9 children between her and her husband.  Works as a  Producer, television/film/video.   FAMILY HISTORY:  Breast cancer runs in her family, alcoholism runs in  her family. No colon cancer, colon polyps in her family.   PHYSICAL EXAMINATION:  5 feet 7 inches, 180 pounds, blood pressure  102/60, pulse 68.  CONSTITUTIONAL:  A generally well appearing.  NEUROLOGIC:  Alert and oriented x3.  EYES:  Extraocular movements intact.  MOUTH:  Oropharynx moist, no lesions.  NECK:  Supple, no lymphadenopathy.  CARDIOVASCULAR:  Heart regular rate and rhythm.  LUNGS:  Clear to auscultation bilaterally.  ABDOMEN:  Soft, nontender, nondistended, normal bowel sounds.  EXTREMITIES:  No lower extremity edema.  SKIN:  No rashes or lesions on visible extremities.   ASSESSMENT/PLAN:  A 40 year old woman with upper and lower  gastrointestinal symptoms.   First for her alternating constipation and diarrhea, this is quite  common and usually is resolved with increasing fibre intake.  She has  tried to do this with changing her diet, but I find fibre supplements  are the most effective way so I am recommending that she be taking 1-2  scoops of Citrucel on a daily basis.  She will get a basic set of labs,  including a complete metabolic profile, thyroid testing and a CBC.  Her  upper GI symptoms of nausea or vomiting possibly may be related to her  rather chronic constipation now and hopefully as we improve her bowel  habits the upper GI symptoms will improve.  She has not noticed any  difference on  the Nexium so she will hold that for now.  She also will  keep a food diary.  I did not note this above, but she did have a  abdominal ultrasound April 2006 and it was essentially normal.  She will  return to see me in 4-5 weeks and sooner if needed.  If any of the lab  testing is significantly out of normal range or if her symptoms do not  improve as her bowels do, then we will have to consider further work up  including imagining studies and/or endoscopy.     Rachael Fee, MD  Electronically Signed    DPJ/MedQ  DD: 08/08/2006  DT: 08/08/2006  Job #: [id number]   cc:   Lelon Perla, DO

## 2010-12-29 ENCOUNTER — Other Ambulatory Visit: Payer: Self-pay | Admitting: Family Medicine

## 2010-12-29 MED ORDER — AMPHETAMINE-DEXTROAMPHETAMINE 20 MG PO TABS: 20.0000 mg | ORAL_TABLET | Freq: Two times a day (BID) | ORAL | Status: DC

## 2010-12-29 NOTE — Telephone Encounter (Signed)
Patient needs prescription for Adderral---hasnt filled it for approx 4-5 months because of work, scheduling, forgetting to call, etc---will Dr Laury Axon approve of new prescription or does she need appt???

## 2010-12-29 NOTE — Telephone Encounter (Signed)
It has been 6 mos since her last Rx and she is due for an appt. Please schedule    KP

## 2010-12-29 NOTE — Telephone Encounter (Signed)
Refill x1   Needs ov for any more 

## 2011-03-05 ENCOUNTER — Encounter: Payer: Self-pay | Admitting: Family Medicine

## 2011-03-05 ENCOUNTER — Ambulatory Visit (INDEPENDENT_AMBULATORY_CARE_PROVIDER_SITE_OTHER): Payer: Managed Care, Other (non HMO) | Admitting: Family Medicine

## 2011-03-05 VITALS — BP 116/82 | HR 82 | Temp 98.8°F | Wt 186.0 lb

## 2011-03-05 DIAGNOSIS — F988 Other specified behavioral and emotional disorders with onset usually occurring in childhood and adolescence: Secondary | ICD-10-CM

## 2011-03-05 MED ORDER — AMPHETAMINE-DEXTROAMPHET ER 30 MG PO CP24: 30.0000 mg | ORAL_CAPSULE | ORAL | Status: DC

## 2011-03-05 MED ORDER — AMPHETAMINE-DEXTROAMPHETAMINE 30 MG PO TABS: 30.0000 mg | ORAL_TABLET | Freq: Two times a day (BID) | ORAL | Status: DC

## 2011-03-05 NOTE — Patient Instructions (Signed)
Attention Deficit Hyperactivity Disorder Attention deficit hyperactivity disorder (ADHD) is a problem with behavior issues based on the way the brain functions (neurobehavioral disorder). It is a common reason for behavior and academic problems in school. CAUSES  The cause of ADHD is unknown in most cases. It may run in families. It sometimes can be associated with learning disabilities and other behavioral problems. SYMPTOMS  There are 3 types of ADHD. The 3 types and some of the symptoms include:  Inattentive   Gets bored or distracted easily.   Loses or forgets things. Forgets to hand in homework.   Has trouble organizing or completing tasks.   Difficulty staying on task.   An inability to organize daily tasks and school work.   Leaving projects, chores, or homework unfinished.   Trouble paying attention or responding to details. Careless mistakes.   Difficulty following directions. Often seems like is not listening.   Dislikes activities that require sustained attention (like chores or homework).   Hyperactive-impulsive   Feels like it is impossible to sit still or stay in a seat. Fidgeting with hands and feet.   Trouble waiting turn.   Talking too much or out of turn. Interruptive.   Speaks or acts impulsively.   Aggressive, disruptive behavior.   Constantly busy or on the go, noisy.   Combined   Has symptoms of both of the above.  Often children with ADHD feel discouraged about themselves and with school. They often perform well below their abilities in school. These symptoms can cause problems in home, school, and in relationships with peers. As children get older, the excess motor activities can calm down, but the problems with paying attention and staying organized persist. Most children do not outgrow ADHD but with good treatment can learn to cope with the symptoms. DIAGNOSIS  When ADHD is suspected, the diagnosis should be made by professionals trained in  ADHD.  Diagnosis will include:  Ruling out other reasons for the child's behavior.   The caregivers will check with the child's school and check their medical records.   They will talk to teachers and parents.   Behavior rating scales for the child will be filled out by those dealing with the child on a daily basis.  A diagnosis is made only after all information has been considered. TREATMENT  Treatment usually includes behavioral treatment often along with medicines. It may include stimulant medicines. The stimulant medicines decrease impulsivity and hyperactivity and increase attention. Other medicines used include antidepressants and certain blood pressure medicines. Most experts agree that treatment for ADHD should address all aspects of the child's functioning. Treatment should not be limited to the use of medicines alone. Treatment should include structured classroom management. The parents must receive education to address rewarding good behavior, discipline, and limit-setting. Tutoring or behavioral therapy or both should be available for the child. If untreated, the disorder can have long-term serious effects into adolescence and adulthood. HOME CARE INSTRUCTIONS   Often with ADHD there is a lot of frustration among the family in dealing with the illness. There is often blame and anger that is not warranted. This is a life long illness. There is no way to prevent ADHD. In many cases, because the problem affects the family as a whole, the entire family may need help. A therapist can help the family find better ways to handle the disruptive behaviors and promote change. If the child is young, most of the therapist's work is with the parents. Parents will   learn techniques for coping with and improving their child's behavior. Sometimes only the child with the ADHD needs counseling. Your caregivers can help you make these decisions.   Children with ADHD may need help in organizing. Some  helpful tips include:   Keep routines the same every day from wake-up time to bedtime. Schedule everything. This includes homework and playtime. This should include outdoor and indoor recreation. Keep the schedule on the refrigerator or a bulletin board where it is frequently seen. Mark schedule changes as far in advance as possible.   Have a place for everything and keep everything in its place. This includes clothing, backpacks, and school supplies.   Encourage writing down assignments and bringing home needed books.   Offer your child a well-balanced diet. Breakfast is especially important for school performance. Children should avoid drinks with caffeine including:   Soft drinks.   Coffee.   Tea.   However, some older children (adolescents) may find these drinks helpful in improving their attention.   Children with ADHD need consistent rules that they can understand and follow. If rules are followed, give small rewards. Children with ADHD often receive, and expect, criticism. Look for good behavior and praise it. Set realistic goals. Give clear instructions. Look for activities that can foster success and self-esteem. Make time for pleasant activities with your child. Give lots of affection.   Parents are their children's greatest advocates. Learn as much as possible about ADHD. This helps you become a stronger and better advocate for your child. It also helps you educate your child's teachers and instructors if they feel inadequate in these areas. Parent support groups are often helpful. A national group with local chapters is called CHADD (Children and Adults with Attention Deficit Hyperactivity Disorder).  PROGNOSIS  There is no cure for ADHD. Children with the disorder seldom outgrow it. Many find adaptive ways to accommodate the ADHD as they mature. SEEK MEDICAL CARE IF:  Your child has repeated muscle twitches, cough or speech outbursts.   Your child has sleep problems.   Your  child has a marked loss of appetite.   Your child develops depression.   Your child has new or worsening behavioral problems.   Your child develops dizziness.   Your child has a racing heart.   Your child has stomach pains.   Your child develops headaches.  Document Released: 03/26/2002 Document Revised: 12/16/2010 Document Reviewed: 11/06/2007 [employer] Patient Information 2012 [employer]. 

## 2011-03-05 NOTE — Progress Notes (Signed)
  Subjective:    Patient ID: Cheryl Patterson, female    DOB: [birth year], 40 y.o.   MRN: [id number]  HPI Pt is here to f/u ADD.  Doing ok with current meds but would like to increase med.    No other complaints.    Review of Systems As above    Objective:   Physical Exam  Constitutional: She is oriented to person, place, and time. She appears well-developed and well-nourished.  Cardiovascular: Normal rate and regular rhythm.   Pulmonary/Chest: Effort normal and breath sounds normal.  Neurological: She is alert and oriented to person, place, and time.  Psychiatric: She has a normal mood and affect. Her behavior is normal.          Assessment & Plan:

## 2011-03-07 NOTE — Assessment & Plan Note (Addendum)
Increase dose adderall rto 6 months or sooner prn

## 2011-03-08 ENCOUNTER — Telehealth: Payer: Self-pay | Admitting: Family Medicine

## 2011-03-08 NOTE — Telephone Encounter (Signed)
I made patient aware that I found the Rx Friday afternoon in the room and I took it to check in and she will pick it up in the morning   KP

## 2011-05-06 ENCOUNTER — Telehealth: Payer: Self-pay

## 2011-05-06 MED ORDER — AMPHETAMINE-DEXTROAMPHET ER 20 MG PO CP24: 20.0000 mg | ORAL_CAPSULE | ORAL | Status: DC

## 2011-05-06 NOTE — Telephone Encounter (Signed)
adderall xr 30 mg #30  1 po qd  

## 2011-05-06 NOTE — Telephone Encounter (Signed)
Spoke with patient and she stated she wanted to go back to the time release Adderall 20 XR instead of the 30 mg. She has switched insurances and the Rx should be more affordable for her now. Please advise    KP

## 2011-05-06 NOTE — Telephone Encounter (Signed)
Ok for 20 mg XR per Dr. Laury Axon and Rx has been printed and left at check in.    KP

## 2011-07-08 ENCOUNTER — Ambulatory Visit (INDEPENDENT_AMBULATORY_CARE_PROVIDER_SITE_OTHER): Payer: Managed Care, Other (non HMO) | Admitting: Women's Health

## 2011-07-08 ENCOUNTER — Encounter: Payer: Self-pay | Admitting: Women's Health

## 2011-07-08 ENCOUNTER — Other Ambulatory Visit (HOSPITAL_COMMUNITY)
Admission: RE | Admit: 2011-07-08 | Discharge: 2011-07-08 | Disposition: A | Discharge status: Home / Self Care | Payer: Managed Care, Other (non HMO) | Source: Ambulatory Visit | Attending: Obstetrics and Gynecology | Admitting: Obstetrics and Gynecology

## 2011-07-08 VITALS — BP 114/76 | Ht 67.0 in | Wt 173.0 lb

## 2011-07-08 DIAGNOSIS — Z01419 Encounter for gynecological examination (general) (routine) without abnormal findings: Secondary | ICD-10-CM | POA: Insufficient documentation

## 2011-07-08 DIAGNOSIS — E079 Disorder of thyroid, unspecified: Secondary | ICD-10-CM

## 2011-07-08 DIAGNOSIS — Z113 Encounter for screening for infections with a predominantly sexual mode of transmission: Secondary | ICD-10-CM

## 2011-07-08 NOTE — Patient Instructions (Signed)
Schedule mammogram TODAY!!!!!!Health Maintenance, Females A healthy lifestyle and preventative care can promote health and wellness.  Maintain regular health, dental, and eye exams.   Eat a healthy diet. Foods like vegetables, fruits, whole grains, low-fat dairy products, and lean protein foods contain the nutrients you need without too many calories. Decrease your intake of foods high in solid fats, added sugars, and salt. Get information about a proper diet from your caregiver, if necessary.   Regular physical exercise is one of the most important things you can do for your health. Most adults should get at least 150 minutes of moderate-intensity exercise (any activity that increases your heart rate and causes you to sweat) each week. In addition, most adults need muscle-strengthening exercises on 2 or more days a week.    Maintain a healthy weight. The body mass index (BMI) is a screening tool to identify possible weight problems. It provides an estimate of body fat based on height and weight. Your caregiver can help determine your BMI, and can help you achieve or maintain a healthy weight. For adults 20 years and older:   A BMI below 18.5 is considered underweight.   A BMI of 18.5 to 24.9 is normal.   A BMI of 25 to 29.9 is considered overweight.   A BMI of 30 and above is considered obese.   Maintain normal blood lipids and cholesterol by exercising and minimizing your intake of saturated fat. Eat a balanced diet with plenty of fruits and vegetables. Blood tests for lipids and cholesterol should begin at age 7 and be repeated every 5 years. If your lipid or cholesterol levels are high, you are over 50, or you are a high risk for heart disease, you may need your cholesterol levels checked more frequently.Ongoing high lipid and cholesterol levels should be treated with medicines if diet and exercise are not effective.   If you smoke, find out from your caregiver how to quit. If you do not  use tobacco, do not start.   If you are pregnant, do not drink alcohol. If you are breastfeeding, be very cautious about drinking alcohol. If you are not pregnant and choose to drink alcohol, do not exceed 1 drink per day. One drink is considered to be 12 ounces (355 mL) of beer, 5 ounces (148 mL) of wine, or 1.5 ounces (44 mL) of liquor.   Avoid use of street drugs. Do not share needles with anyone. Ask for help if you need support or instructions about stopping the use of drugs.   High blood pressure causes heart disease and increases the risk of stroke. Blood pressure should be checked at least every 1 to 2 years. Ongoing high blood pressure should be treated with medicines, if weight loss and exercise are not effective.   If you are 74 to 41 years old, ask your caregiver if you should take aspirin to prevent strokes.   Diabetes screening involves taking a blood sample to check your fasting blood sugar level. This should be done once every 3 years, after age 70, if you are within normal weight and without risk factors for diabetes. Testing should be considered at a younger age or be carried out more frequently if you are overweight and have at least 1 risk factor for diabetes.   Breast cancer screening is essential preventative care for women. You should practice "breast self-awareness." This means understanding the normal appearance and feel of your breasts and may include breast self-examination. Any changes detected, no  matter how small, should be reported to a caregiver. Women in their 20s and 30s should have a clinical breast exam (CBE) by a caregiver as part of a regular health exam every 1 to 3 years. After age 24, women should have a CBE every year. Starting at age 47, women should consider having a mammogram (breast X-ray) every year. Women who have a family history of breast cancer should talk to their caregiver about genetic screening. Women at a high risk of breast cancer should talk to  their caregiver about having an MRI and a mammogram every year.   The Pap test is a screening test for cervical cancer. Women should have a Pap test starting at age 54. Between ages 78 and 74, Pap tests should be repeated every 2 years. Beginning at age 61, you should have a Pap test every 3 years as long as the past 3 Pap tests have been normal. If you had a hysterectomy for a problem that was not cancer or a condition that could lead to cancer, then you no longer need Pap tests. If you are between ages 63 and 53, and you have had normal Pap tests going back 10 years, you no longer need Pap tests. If you have had past treatment for cervical cancer or a condition that could lead to cancer, you need Pap tests and screening for cancer for at least 20 years after your treatment. If Pap tests have been discontinued, risk factors (such as a new sexual partner) need to be reassessed to determine if screening should be resumed. Some women have medical problems that increase the chance of getting cervical cancer. In these cases, your caregiver may recommend more frequent screening and Pap tests.   The human papillomavirus (HPV) test is an additional test that may be used for cervical cancer screening. The HPV test looks for the virus that can cause the cell changes on the cervix. The cells collected during the Pap test can be tested for HPV. The HPV test could be used to screen women aged 71 years and older, and should be used in women of any age who have unclear Pap test results. After the age of 46, women should have HPV testing at the same frequency as a Pap test.   Colorectal cancer can be detected and often prevented. Most routine colorectal cancer screening begins at the age of 57 and continues through age 92. However, your caregiver may recommend screening at an earlier age if you have risk factors for colon cancer. On a yearly basis, your caregiver may provide home test kits to check for hidden blood in the  stool. Use of a small camera at the end of a tube, to directly examine the colon (sigmoidoscopy or colonoscopy), can detect the earliest forms of colorectal cancer. Talk to your caregiver about this at age 70, when routine screening begins. Direct examination of the colon should be repeated every 5 to 10 years through age 61, unless early forms of pre-cancerous polyps or small growths are found.   Hepatitis C blood testing is recommended for all people born from [birth year] through 1965 and any individual with known risks for hepatitis C.   Practice safe sex. Use condoms and avoid high-risk sexual practices to reduce the spread of sexually transmitted infections (STIs). Sexually active women aged 71 and younger should be checked for Chlamydia, which is a common sexually transmitted infection. Older women with new or multiple partners should also be tested for Chlamydia. Testing  for other STIs is recommended if you are sexually active and at increased risk.   Osteoporosis is a disease in which the bones lose minerals and strength with aging. This can result in serious bone fractures. The risk of osteoporosis can be identified using a bone density scan. Women ages 80 and over and women at risk for fractures or osteoporosis should discuss screening with their caregivers. Ask your caregiver whether you should be taking a calcium supplement or vitamin D to reduce the rate of osteoporosis.   Menopause can be associated with physical symptoms and risks. Hormone replacement therapy is available to decrease symptoms and risks. You should talk to your caregiver about whether hormone replacement therapy is right for you.   Use sunscreen with a sun protection factor (SPF) of 30 or greater. Apply sunscreen liberally and repeatedly throughout the day. You should seek shade when your shadow is shorter than you. Protect yourself by wearing long sleeves, pants, a wide-brimmed hat, and sunglasses year round, whenever you are  outdoors.   Notify your caregiver of new moles or changes in moles, especially if there is a change in shape or color. Also notify your caregiver if a mole is larger than the size of a pencil eraser.   Stay current with your immunizations.  Document Released: 10/19/2010 Document Revised: 03/25/2011 Document Reviewed: 10/19/2010 [facility] Patient Information 2012 [place], [place].
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
## 2011-07-09 LAB — URINALYSIS W MICROSCOPIC + REFLEX CULTURE
Bacteria, UA: NONE SEEN
Casts: NONE SEEN
Crystals: NONE SEEN
Glucose, UA: NEGATIVE mg/dL
Hgb urine dipstick: NEGATIVE
Leukocytes, UA: NEGATIVE
Nitrite: NEGATIVE
Specific Gravity, Urine: 1.02 (ref 1.005–1.030)
Squamous Epithelial / LPF: NONE SEEN
pH: 6.5 (ref 5.0–8.0)

## 2011-07-09 LAB — HEPATITIS B SURFACE ANTIGEN: Hepatitis B Surface Ag: NEGATIVE

## 2011-07-09 LAB — GC/CHLAMYDIA PROBE AMP, GENITAL
Chlamydia, DNA Probe: NEGATIVE
GC Probe Amp, Genital: NEGATIVE

## 2011-08-16 ENCOUNTER — Telehealth: Payer: Self-pay | Admitting: *Deleted

## 2011-08-16 MED ORDER — AMPHETAMINE-DEXTROAMPHETAMINE 30 MG PO TABS: 30.0000 mg | ORAL_TABLET | Freq: Two times a day (BID) | ORAL | Status: DC

## 2011-08-16 NOTE — Telephone Encounter (Signed)
Ok to switch to 30mg  bid, #60, no refills

## 2011-08-16 NOTE — Telephone Encounter (Signed)
Pt would like to get Refill on her adderall. Pt notes that she has presently been on 20 mg XR but due to cost she would like to go back to 30 med. Last filled 05-06-11, last OV 03-05-11

## 2011-08-16 NOTE — Telephone Encounter (Signed)
Left Pt detail message Rx ready for pickup after 9 am tomorrow. 

## 2011-08-27 ENCOUNTER — Ambulatory Visit (INDEPENDENT_AMBULATORY_CARE_PROVIDER_SITE_OTHER): Payer: Managed Care, Other (non HMO) | Admitting: Family Medicine

## 2011-08-27 ENCOUNTER — Encounter: Payer: Self-pay | Admitting: Family Medicine

## 2011-08-27 ENCOUNTER — Encounter: Payer: Self-pay | Admitting: Gastroenterology

## 2011-08-27 VITALS — BP 98/72 | HR 67 | Temp 98.3°F | Ht 67.0 in | Wt 172.2 lb

## 2011-08-27 DIAGNOSIS — R110 Nausea: Secondary | ICD-10-CM

## 2011-08-27 DIAGNOSIS — Z0000 Encounter for general adult medical examination without abnormal findings: Secondary | ICD-10-CM

## 2011-08-27 DIAGNOSIS — K219 Gastro-esophageal reflux disease without esophagitis: Secondary | ICD-10-CM

## 2011-08-27 DIAGNOSIS — E785 Hyperlipidemia, unspecified: Secondary | ICD-10-CM

## 2011-08-27 DIAGNOSIS — R109 Unspecified abdominal pain: Secondary | ICD-10-CM

## 2011-08-27 DIAGNOSIS — F988 Other specified behavioral and emotional disorders with onset usually occurring in childhood and adolescence: Secondary | ICD-10-CM

## 2011-08-27 DIAGNOSIS — R319 Hematuria, unspecified: Secondary | ICD-10-CM

## 2011-08-27 LAB — POCT URINALYSIS DIPSTICK
Bilirubin, UA: NEGATIVE
Glucose, UA: NEGATIVE
Ketones, UA: NEGATIVE
Leukocytes, UA: NEGATIVE
Nitrite, UA: NEGATIVE
Protein, UA: NEGATIVE
Spec Grav, UA: 1.025
Urobilinogen, UA: 0.2
pH, UA: 6

## 2011-08-27 LAB — BASIC METABOLIC PANEL
BUN: 12 mg/dL (ref 6–23)
Calcium: 9 mg/dL (ref 8.4–10.5)
GFR: 89.74 mL/min (ref >60.00)
Glucose, Bld: 90 mg/dL (ref 70–99)
Potassium: 4 mEq/L (ref 3.5–5.1)

## 2011-08-27 LAB — CBC WITH DIFFERENTIAL/PLATELET
Basophils Absolute: 0 10*3/uL (ref 0.0–0.1)
Basophils Relative: 0.4 % (ref 0.0–3.0)
Eosinophils Absolute: 0.1 10*3/uL (ref 0.0–0.7)
Eosinophils Relative: 1.9 % (ref 0.0–5.0)
HCT: 39.6 % (ref 36.0–46.0)
Hemoglobin: 13.1 g/dL (ref 12.0–15.0)
Lymphocytes Relative: 31.7 % (ref 12.0–46.0)
Lymphs Abs: 1.4 10*3/uL (ref 0.7–4.0)
MCHC: 33.2 g/dL (ref 30.0–36.0)
MCV: 88.9 fl (ref 78.0–100.0)
Monocytes Absolute: 0.3 10*3/uL (ref 0.1–1.0)
Monocytes Relative: 6.2 % (ref 3.0–12.0)
Neutro Abs: 2.6 10*3/uL (ref 1.4–7.7)
Neutrophils Relative %: 59.8 % (ref 43.0–77.0)
Platelets: 246 10*3/uL (ref 150.0–400.0)
RBC: 4.46 Mil/uL (ref 3.87–5.11)
RDW: 12.5 % (ref 11.5–14.6)
WBC: 4.4 10*3/uL — ABNORMAL LOW (ref 4.5–10.5)

## 2011-08-27 LAB — LIPID PANEL
Cholesterol: 191 mg/dL (ref 0–200)
HDL: 48.1 mg/dL (ref >39.00)
LDL Cholesterol: 135 mg/dL — ABNORMAL HIGH (ref 0–99)
Triglycerides: 38 mg/dL (ref 0.0–149.0)
VLDL: 7.6 mg/dL (ref 0.0–40.0)

## 2011-08-27 LAB — HEPATIC FUNCTION PANEL
AST: 17 U/L (ref 0–37)
Total Bilirubin: 0.5 mg/dL (ref 0.3–1.2)

## 2011-08-27 LAB — TSH: TSH: 0.73 u[IU]/mL (ref 0.35–5.50)

## 2011-08-27 LAB — VITAMIN B12: Vitamin B-12: 894 pg/mL (ref 211–911)

## 2011-08-27 MED ORDER — OMEPRAZOLE 40 MG PO CPDR: 40.0000 mg | DELAYED_RELEASE_CAPSULE | Freq: Every day | ORAL | Status: DC

## 2011-08-27 NOTE — Patient Instructions (Signed)
Preventive Care for Adults, Female A healthy lifestyle and preventive care can promote health and wellness. Preventive health guidelines for women include the following key practices.  A routine yearly physical is a good way to check with your caregiver about your health and preventive screening. It is a chance to share any concerns and updates on your health, and to receive a thorough exam.   Visit your dentist for a routine exam and preventive care every 6 months. Brush your teeth twice a day and floss once a day. Good oral hygiene prevents tooth decay and gum disease.   The frequency of eye exams is based on your age, health, family medical history, use of contact lenses, and other factors. Follow your caregiver's recommendations for frequency of eye exams.   Eat a healthy diet. Foods like vegetables, fruits, whole grains, low-fat dairy products, and lean protein foods contain the nutrients you need without too many calories. Decrease your intake of foods high in solid fats, added sugars, and salt. Eat the right amount of calories for you.Get information about a proper diet from your caregiver, if necessary.   Regular physical exercise is one of the most important things you can do for your health. Most adults should get at least 150 minutes of moderate-intensity exercise (any activity that increases your heart rate and causes you to sweat) each week. In addition, most adults need muscle-strengthening exercises on 2 or more days a week.   Maintain a healthy weight. The body mass index (BMI) is a screening tool to identify possible weight problems. It provides an estimate of body fat based on height and weight. Your caregiver can help determine your BMI, and can help you achieve or maintain a healthy weight.For adults 20 years and older:   A BMI below 18.5 is considered underweight.   A BMI of 18.5 to 24.9 is normal.   A BMI of 25 to 29.9 is considered overweight.   A BMI of 30 and above is  considered obese.   Maintain normal blood lipids and cholesterol levels by exercising and minimizing your intake of saturated fat. Eat a balanced diet with plenty of fruit and vegetables. Blood tests for lipids and cholesterol should begin at age 20 and be repeated every 5 years. If your lipid or cholesterol levels are high, you are over 50, or you are at high risk for heart disease, you may need your cholesterol levels checked more frequently.Ongoing high lipid and cholesterol levels should be treated with medicines if diet and exercise are not effective.   If you smoke, find out from your caregiver how to quit. If you do not use tobacco, do not start.   If you are pregnant, do not drink alcohol. If you are breastfeeding, be very cautious about drinking alcohol. If you are not pregnant and choose to drink alcohol, do not exceed 1 drink per day. One drink is considered to be 12 ounces (355 mL) of beer, 5 ounces (148 mL) of wine, or 1.5 ounces (44 mL) of liquor.   Avoid use of street drugs. Do not share needles with anyone. Ask for help if you need support or instructions about stopping the use of drugs.   High blood pressure causes heart disease and increases the risk of stroke. Your blood pressure should be checked at least every 1 to 2 years. Ongoing high blood pressure should be treated with medicines if weight loss and exercise are not effective.   If you are 55 to 41   years old, ask your caregiver if you should take aspirin to prevent strokes.   Diabetes screening involves taking a blood sample to check your fasting blood sugar level. This should be done once every 3 years, after age 45, if you are within normal weight and without risk factors for diabetes. Testing should be considered at a younger age or be carried out more frequently if you are overweight and have at least 1 risk factor for diabetes.   Breast cancer screening is essential preventive care for women. You should practice "breast  self-awareness." This means understanding the normal appearance and feel of your breasts and may include breast self-examination. Any changes detected, no matter how small, should be reported to a caregiver. Women in their 20s and 30s should have a clinical breast exam (CBE) by a caregiver as part of a regular health exam every 1 to 3 years. After age 40, women should have a CBE every year. Starting at age 40, women should consider having a mammography (breast X-ray test) every year. Women who have a family history of breast cancer should talk to their caregiver about genetic screening. Women at a high risk of breast cancer should talk to their caregivers about having magnetic resonance imaging (MRI) and a mammography every year.   The Pap test is a screening test for cervical cancer. A Pap test can show cell changes on the cervix that might become cervical cancer if left untreated. A Pap test is a procedure in which cells are obtained and examined from the lower end of the uterus (cervix).   Women should have a Pap test starting at age 21.   Between ages 21 and 29, Pap tests should be repeated every 2 years.   Beginning at age 30, you should have a Pap test every 3 years as long as the past 3 Pap tests have been normal.   Some women have medical problems that increase the chance of getting cervical cancer. Talk to your caregiver about these problems. It is especially important to talk to your caregiver if a new problem develops soon after your last Pap test. In these cases, your caregiver may recommend more frequent screening and Pap tests.   The above recommendations are the same for women who have or have not gotten the vaccine for human papillomavirus (HPV).   If you had a hysterectomy for a problem that was not cancer or a condition that could lead to cancer, then you no longer need Pap tests. Even if you no longer need a Pap test, a regular exam is a good idea to make sure no other problems are  starting.   If you are between ages 65 and 70, and you have had normal Pap tests going back 10 years, you no longer need Pap tests. Even if you no longer need a Pap test, a regular exam is a good idea to make sure no other problems are starting.   If you have had past treatment for cervical cancer or a condition that could lead to cancer, you need Pap tests and screening for cancer for at least 20 years after your treatment.   If Pap tests have been discontinued, risk factors (such as a new sexual partner) need to be reassessed to determine if screening should be resumed.   The HPV test is an additional test that may be used for cervical cancer screening. The HPV test looks for the virus that can cause the cell changes on the cervix.   The cells collected during the Pap test can be tested for HPV. The HPV test could be used to screen women aged 30 years and older, and should be used in women of any age who have unclear Pap test results. After the age of 30, women should have HPV testing at the same frequency as a Pap test.   Colorectal cancer can be detected and often prevented. Most routine colorectal cancer screening begins at the age of 50 and continues through age 75. However, your caregiver may recommend screening at an earlier age if you have risk factors for colon cancer. On a yearly basis, your caregiver may provide home test kits to check for hidden blood in the stool. Use of a small camera at the end of a tube, to directly examine the colon (sigmoidoscopy or colonoscopy), can detect the earliest forms of colorectal cancer. Talk to your caregiver about this at age 50, when routine screening begins. Direct examination of the colon should be repeated every 5 to 10 years through age 75, unless early forms of pre-cancerous polyps or small growths are found.   Hepatitis C blood testing is recommended for all people born from 1945 through 1965 and any individual with known risks for hepatitis C.    Practice safe sex. Use condoms and avoid high-risk sexual practices to reduce the spread of sexually transmitted infections (STIs). STIs include gonorrhea, chlamydia, syphilis, trichomonas, herpes, HPV, and human immunodeficiency virus (HIV). Herpes, HIV, and HPV are viral illnesses that have no cure. They can result in disability, cancer, and death. Sexually active women aged 25 and younger should be checked for chlamydia. Older women with new or multiple partners should also be tested for chlamydia. Testing for other STIs is recommended if you are sexually active and at increased risk.   Osteoporosis is a disease in which the bones lose minerals and strength with aging. This can result in serious bone fractures. The risk of osteoporosis can be identified using a bone density scan. Women ages 65 and over and women at risk for fractures or osteoporosis should discuss screening with their caregivers. Ask your caregiver whether you should take a calcium supplement or vitamin D to reduce the rate of osteoporosis.   Menopause can be associated with physical symptoms and risks. Hormone replacement therapy is available to decrease symptoms and risks. You should talk to your caregiver about whether hormone replacement therapy is right for you.   Use sunscreen with sun protection factor (SPF) of 30 or more. Apply sunscreen liberally and repeatedly throughout the day. You should seek shade when your shadow is shorter than you. Protect yourself by wearing long sleeves, pants, a wide-brimmed hat, and sunglasses year round, whenever you are outdoors.   Once a month, do a whole body skin exam, using a mirror to look at the skin on your back. Notify your caregiver of new moles, moles that have irregular borders, moles that are larger than a pencil eraser, or moles that have changed in shape or color.   Stay current with required immunizations.   Influenza. You need a dose every fall (or winter). The composition of  the flu vaccine changes each year, so being vaccinated once is not enough.   Pneumococcal polysaccharide. You need 1 to 2 doses if you smoke cigarettes or if you have certain chronic medical conditions. You need 1 dose at age 65 (or older) if you have never been vaccinated.   Tetanus, diphtheria, pertussis (Tdap, Td). Get 1 dose of   Tdap vaccine if you are younger than age 65, are over 65 and have contact with an infant, are a healthcare worker, are pregnant, or simply want to be protected from whooping cough. After that, you need a Td booster dose every 10 years. Consult your caregiver if you have not had at least 3 tetanus and diphtheria-containing shots sometime in your life or have a deep or dirty wound.   HPV. You need this vaccine if you are a woman age 26 or younger. The vaccine is given in 3 doses over 6 months.   Measles, mumps, rubella (MMR). You need at least 1 dose of MMR if you were born in 1957 or later. You may also need a second dose.   Meningococcal. If you are age 19 to 21 and a first-year college student living in a residence hall, or have one of several medical conditions, you need to get vaccinated against meningococcal disease. You may also need additional booster doses.   Zoster (shingles). If you are age 60 or older, you should get this vaccine.   Varicella (chickenpox). If you have never had chickenpox or you were vaccinated but received only 1 dose, talk to your caregiver to find out if you need this vaccine.   Hepatitis A. You need this vaccine if you have a specific risk factor for hepatitis A virus infection or you simply wish to be protected from this disease. The vaccine is usually given as 2 doses, 6 to 18 months apart.   Hepatitis B. You need this vaccine if you have a specific risk factor for hepatitis B virus infection or you simply wish to be protected from this disease. The vaccine is given in 3 doses, usually over 6 months.  Preventive Services /  Frequency Ages 19 to 39  Blood pressure check.** / Every 1 to 2 years.   Lipid and cholesterol check.** / Every 5 years beginning at age 20.   Clinical breast exam.** / Every 3 years for women in their 20s and 30s.   Pap test.** / Every 2 years from ages 21 through 29. Every 3 years starting at age 30 through age 65 or 70 with a history of 3 consecutive normal Pap tests.   HPV screening.** / Every 3 years from ages 30 through ages 65 to 70 with a history of 3 consecutive normal Pap tests.   Hepatitis C blood test.** / For any individual with known risks for hepatitis C.   Skin self-exam. / Monthly.   Influenza immunization.** / Every year.   Pneumococcal polysaccharide immunization.** / 1 to 2 doses if you smoke cigarettes or if you have certain chronic medical conditions.   Tetanus, diphtheria, pertussis (Tdap, Td) immunization. / A one-time dose of Tdap vaccine. After that, you need a Td booster dose every 10 years.   HPV immunization. / 3 doses over 6 months, if you are 26 and younger.   Measles, mumps, rubella (MMR) immunization. / You need at least 1 dose of MMR if you were born in 1957 or later. You may also need a second dose.   Meningococcal immunization. / 1 dose if you are age 19 to 21 and a first-year college student living in a residence hall, or have one of several medical conditions, you need to get vaccinated against meningococcal disease. You may also need additional booster doses.   Varicella immunization.** / Consult your caregiver.   Hepatitis A immunization.** / Consult your caregiver. 2 doses, 6 to 18 months   apart.   Hepatitis B immunization.** / Consult your caregiver. 3 doses usually over 6 months.  Ages 40 to 64  Blood pressure check.** / Every 1 to 2 years.   Lipid and cholesterol check.** / Every 5 years beginning at age 20.   Clinical breast exam.** / Every year after age 40.   Mammogram.** / Every year beginning at age 40 and continuing for as  long as you are in good health. Consult with your caregiver.   Pap test.** / Every 3 years starting at age 30 through age 65 or 70 with a history of 3 consecutive normal Pap tests.   HPV screening.** / Every 3 years from ages 30 through ages 65 to 70 with a history of 3 consecutive normal Pap tests.   Fecal occult blood test (FOBT) of stool. / Every year beginning at age 50 and continuing until age 75. You may not need to do this test if you get a colonoscopy every 10 years.   Flexible sigmoidoscopy or colonoscopy.** / Every 5 years for a flexible sigmoidoscopy or every 10 years for a colonoscopy beginning at age 50 and continuing until age 75.   Hepatitis C blood test.** / For all people born from 1945 through 1965 and any individual with known risks for hepatitis C.   Skin self-exam. / Monthly.   Influenza immunization.** / Every year.   Pneumococcal polysaccharide immunization.** / 1 to 2 doses if you smoke cigarettes or if you have certain chronic medical conditions.   Tetanus, diphtheria, pertussis (Tdap, Td) immunization.** / A one-time dose of Tdap vaccine. After that, you need a Td booster dose every 10 years.   Measles, mumps, rubella (MMR) immunization. / You need at least 1 dose of MMR if you were born in 1957 or later. You may also need a second dose.   Varicella immunization.** / Consult your caregiver.   Meningococcal immunization.** / Consult your caregiver.   Hepatitis A immunization.** / Consult your caregiver. 2 doses, 6 to 18 months apart.   Hepatitis B immunization.** / Consult your caregiver. 3 doses, usually over 6 months.  Ages 65 and over  Blood pressure check.** / Every 1 to 2 years.   Lipid and cholesterol check.** / Every 5 years beginning at age 20.   Clinical breast exam.** / Every year after age 40.   Mammogram.** / Every year beginning at age 40 and continuing for as long as you are in good health. Consult with your caregiver.   Pap test.** /  Every 3 years starting at age 30 through age 65 or 70 with a 3 consecutive normal Pap tests. Testing can be stopped between 65 and 70 with 3 consecutive normal Pap tests and no abnormal Pap or HPV tests in the past 10 years.   HPV screening.** / Every 3 years from ages 30 through ages 65 or 70 with a history of 3 consecutive normal Pap tests. Testing can be stopped between 65 and 70 with 3 consecutive normal Pap tests and no abnormal Pap or HPV tests in the past 10 years.   Fecal occult blood test (FOBT) of stool. / Every year beginning at age 50 and continuing until age 75. You may not need to do this test if you get a colonoscopy every 10 years.   Flexible sigmoidoscopy or colonoscopy.** / Every 5 years for a flexible sigmoidoscopy or every 10 years for a colonoscopy beginning at age 50 and continuing until age 75.   Hepatitis   C blood test.** / For all people born from 1945 through 1965 and any individual with known risks for hepatitis C.   Osteoporosis screening.** / A one-time screening for women ages 65 and over and women at risk for fractures or osteoporosis.   Skin self-exam. / Monthly.   Influenza immunization.** / Every year.   Pneumococcal polysaccharide immunization.** / 1 dose at age 65 (or older) if you have never been vaccinated.   Tetanus, diphtheria, pertussis (Tdap, Td) immunization. / A one-time dose of Tdap vaccine if you are over 65 and have contact with an infant, are a healthcare worker, or simply want to be protected from whooping cough. After that, you need a Td booster dose every 10 years.   Varicella immunization.** / Consult your caregiver.   Meningococcal immunization.** / Consult your caregiver.   Hepatitis A immunization.** / Consult your caregiver. 2 doses, 6 to 18 months apart.   Hepatitis B immunization.** / Check with your caregiver. 3 doses, usually over 6 months.  ** Family history and personal history of risk and conditions may change your caregiver's  recommendations. Document Released: 06/01/2001 Document Revised: 03/25/2011 Document Reviewed: 08/31/2010 [employer] Patient Information 2012 [employer]. 

## 2011-08-27 NOTE — Assessment & Plan Note (Signed)
con't meds  Check labs 

## 2011-08-27 NOTE — Progress Notes (Signed)
  Subjective:     Cheryl Patterson is a 41 y.o. female and is here for a comprehensive physical exam. The patient reports no problems.  History   Social History  . Marital Status: Married    Spouse Name: N/A    Number of Children: N/A  . Years of Education: N/A   Occupational History  . levelor    Social History Main Topics  . Smoking status: Never Smoker   . Smokeless tobacco: Never Used  . Alcohol Use: No  . Drug Use: No  . Sexually Active: No   Other Topics Concern  . Not on file   Social History Narrative   Exercise--walk qd                 Video--  rare   Health Maintenance  Topic Date Due  . Mammogram  07/16/2009  . Influenza Vaccine  01/18/2012  . Pap Smear  07/08/2014  . Tetanus/tdap  08/08/2014    The following portions of the patient's history were reviewed and updated as appropriate: allergies, current medications, past family history, past medical history, past social history, past surgical history and problem list.  Review of Systems Review of Systems  Constitutional: Negative for activity change, appetite change and fatigue.  HENT: Negative for hearing loss, congestion, tinnitus and ear discharge.  dentist -due Eyes: Negative for visual disturbance (see optho q1y -- vision corrected to 20/20 with glasses).  Respiratory: Negative for cough, chest tightness and shortness of breath.   Cardiovascular: Negative for chest pain, palpitations and leg swelling.  Gastrointestinal: Negative for abdominal pain, diarrhea, constipation and abdominal distention.  + nausea Genitourinary: Negative for urgency, frequency, decreased urine volume and difficulty urinating.  Musculoskeletal: Negative for back pain, arthralgias and gait problem.  Skin: Negative for color change, pallor and rash.  Neurological: Negative for dizziness, light-headedness, numbness and headaches.  Hematological: Negative for adenopathy. Does not bruise/bleed easily.  Psychiatric/Behavioral: Negative  for suicidal ideas, confusion, sleep disturbance, self-injury, dysphoric mood, decreased concentration and agitation.       Objective:    BP 98/72  Pulse 67  Temp(Src) 98.3 F (36.8 C) (Oral)  Ht 5\' 7"  (1.702 m)  Wt 172 lb 3.2 oz (78.109 kg)  BMI 26.97 kg/m2  SpO2 98% General appearance: alert, cooperative, appears stated age and no distress Head: Normocephalic, without obvious abnormality, atraumatic Eyes: conjunctivae/corneas clear. PERRL, EOM's intact. Fundi benign. Ears: normal TM's and external ear canals both ears Nose: Nares normal. Septum midline. Mucosa normal. No drainage or sinus tenderness. Throat: lips, mucosa, and tongue normal; teeth and gums normal Neck: no adenopathy, no carotid bruit, no JVD, supple, symmetrical, trachea midline and thyroid not enlarged, symmetric, no tenderness/mass/nodules Back: symmetric, no curvature. ROM normal. No CVA tenderness. Lungs: clear to auscultation bilaterally Breasts: gyn Heart: regular rate and rhythm, S1, S2 normal, no murmur, click, rub or gallop Abdomen: soft, non-tender; bowel sounds normal; no masses,  no organomegaly Pelvic: gyn Extremities: extremities normal, atraumatic, no cyanosis or edema Pulses: 2+ and symmetric Skin: Skin color, texture, turgor normal. No rashes or lesions Lymph nodes: Cervical, supraclavicular, and axillary nodes normal. Neurologic: Grossly normal psych--no anxiety, depression    Assessment:    Healthy female exam.      Plan:  ghm utd Check labs   See After Visit Summary for Counseling Recommendations

## 2011-08-27 NOTE — Assessment & Plan Note (Signed)
-  cont current meds 

## 2011-09-10 ENCOUNTER — Ambulatory Visit: Payer: Managed Care, Other (non HMO) | Admitting: Gastroenterology

## 2011-09-20 ENCOUNTER — Encounter: Payer: Self-pay | Admitting: Gastroenterology

## 2011-09-20 ENCOUNTER — Other Ambulatory Visit: Payer: Managed Care, Other (non HMO)

## 2011-09-20 ENCOUNTER — Ambulatory Visit (INDEPENDENT_AMBULATORY_CARE_PROVIDER_SITE_OTHER): Payer: Managed Care, Other (non HMO) | Admitting: Gastroenterology

## 2011-09-20 VITALS — BP 100/68 | HR 88 | Ht 67.0 in | Wt 176.4 lb

## 2011-09-20 DIAGNOSIS — R194 Change in bowel habit: Secondary | ICD-10-CM

## 2011-09-20 DIAGNOSIS — R109 Unspecified abdominal pain: Secondary | ICD-10-CM

## 2011-09-20 DIAGNOSIS — R198 Other specified symptoms and signs involving the digestive system and abdomen: Secondary | ICD-10-CM

## 2011-09-20 DIAGNOSIS — R110 Nausea: Secondary | ICD-10-CM

## 2011-09-20 MED ORDER — MOVIPREP 100 G PO SOLR: 1.0000 | ORAL | Status: DC

## 2011-09-20 MED ORDER — HYOSCYAMINE SULFATE ER 0.375 MG PO TB12: 375.0000 ug | ORAL_TABLET | Freq: Two times a day (BID) | ORAL | Status: DC

## 2011-09-20 NOTE — Progress Notes (Signed)
HPI: This is a   very pleasant 41 year old woman whom I last saw about 5 years ago.   For about 2-3 years; she has intermittent cramping, lower abdomen.  She has loose stools intermittently.  She has never noticed blood in her cramps.  The cramping is daily.  Cutting back on spicey foods may or may not help.  Cramps last several hours.  Pepto and or imodium will often help.    She can have vomiting if cramping does not subside.  Has been going  She takes imodium for cramps and says this helps.  She saw me in 2008 but never followed up. She had fairly similar complaints as to narrow however constipation seemed to be a bigger issue for her.  Constipation not a big complaint these days.  She has been trying to lose weight, overall down 4 pounds in 5 yearas on our scales here.  Recent CBC, complete metabolic profile was normal   Review of systems: Pertinent positive and negative review of systems were noted in the above HPI section. Complete review of systems was performed and was otherwise normal.    Past Medical History  Diagnosis Date  . Hyperlipidemia   . Chronic headaches   . Depression     Hosp 1993 [facility]---suicidal ideation  . Bipolar depression     Past Surgical History  Procedure Date  . Tubal ligation   . Leep 2005  . Breast surgery     BREAST LUMP REMOVAL ??WHICH BREAST  . Other surgical history     RENAL STONE REMOVAL  . Mouth surgery   . Lipoma excision     Current Outpatient Prescriptions  Medication Sig Dispense Refill  . amphetamine-dextroamphetamine (ADDERALL, 30MG ,) 30 MG tablet Take 1 tablet (30 mg total) by mouth 2 (two) times daily.  60 tablet  0  . omeprazole (PRILOSEC) 40 MG capsule Take 1 capsule (40 mg total) by mouth daily.  30 capsule  3    Allergies as of 09/20/2011 - Review Complete 09/20/2011  Allergen Reaction Noted  . Codeine  10/25/2006    Family History  Problem Relation Age of Onset  . Hypertension    . Stroke Paternal  Grandmother   . Hypertension Paternal Grandmother   . Heart disease Paternal Grandmother   . Breast cancer Paternal Aunt   . Stomach cancer Maternal Aunt   . Colon cancer Maternal Aunt   . Uterine cancer Maternal Aunt   . Brain cancer Maternal Aunt   . Diabetes Maternal Grandfather   . Breast cancer Mother   . Cancer Maternal Uncle     COLON    History   Social History  . Marital Status: Married    Spouse Name: N/A    Number of Children: 2  . Years of Education: N/A   Occupational History  . levelor    Social History Main Topics  . Smoking status: Never Smoker   . Smokeless tobacco: Never Used  . Alcohol Use: No  . Drug Use: No  . Sexually Active: No   Other Topics Concern  . Not on file   Social History Narrative   Exercise--walk qd                 Video--  rare       Physical Exam: BP 100/68  Pulse 88  Ht 5\' 7"  (1.702 m)  Wt 176 lb 6.4 oz (80.015 kg)  BMI 27.63 kg/m2  LMP 08/30/2011 Constitutional: generally well-appearing Psychiatric:  alert and oriented x3 Eyes: extraocular movements intact Mouth: oral pharynx moist, no lesions Neck: supple no lymphadenopathy Cardiovascular: heart regular rate and rhythm Lungs: clear to auscultation bilaterally Abdomen: soft, nontender, nondistended, no obvious ascites, no peritoneal signs, normal bowel sounds Extremities: no lower extremity edema bilaterally Skin: no lesions on visible extremities    Assessment and plan: 41 y.o. female with  lower abdominal cramping, altered bowel habits  First she is using nearly daily and Imodium for cramping in her lower abdomen. I explained to her that this is more of a diarrhea medicine and I have given her a prescription for more typical antispasmodic medicine to be taken twice daily. He can had some constipating effects. I think we should proceed with colonoscopy to check for occult neoplasm, inflammation.

## 2011-09-20 NOTE — Patient Instructions (Signed)
You will be set up for a colonoscopy for abd pains, altered bowels. New prescription called in for twice daily antispasm medicine, use in place of imodium. You will have labs checked today in the basement lab.  Please head down after you check out with the front desk  (celiac sprue panel, stool for ova and parasite). A copy of this information will be made available to Dr. Laury Axon.

## 2011-09-21 LAB — CELIAC PANEL 10
Endomysial Screen: NEGATIVE
Gliadin IgA: 3.4 U/mL (ref <20)
Gliadin IgG: 10.9 U/mL (ref <20)
IgA: 94 mg/dL (ref 69–380)
Tissue Transglut Ab: 9.6 U/mL (ref <20)

## 2011-10-01 ENCOUNTER — Other Ambulatory Visit: Payer: Managed Care, Other (non HMO)

## 2011-10-01 DIAGNOSIS — R109 Unspecified abdominal pain: Secondary | ICD-10-CM

## 2011-10-01 DIAGNOSIS — R110 Nausea: Secondary | ICD-10-CM

## 2011-10-04 LAB — OVA AND PARASITE SCREEN: OP: NONE SEEN

## 2011-10-06 ENCOUNTER — Encounter: Payer: Self-pay | Admitting: Gastroenterology

## 2011-10-06 ENCOUNTER — Ambulatory Visit (AMBULATORY_SURGERY_CENTER): Payer: Managed Care, Other (non HMO) | Admitting: Gastroenterology

## 2011-10-06 VITALS — BP 108/62 | HR 81 | Temp 96.3°F | Resp 16 | Ht 67.0 in | Wt 176.0 lb

## 2011-10-06 DIAGNOSIS — D126 Benign neoplasm of colon, unspecified: Secondary | ICD-10-CM

## 2011-10-06 DIAGNOSIS — R110 Nausea: Secondary | ICD-10-CM

## 2011-10-06 DIAGNOSIS — R198 Other specified symptoms and signs involving the digestive system and abdomen: Secondary | ICD-10-CM

## 2011-10-06 DIAGNOSIS — R109 Unspecified abdominal pain: Secondary | ICD-10-CM

## 2011-10-06 MED ORDER — SODIUM CHLORIDE 0.9 % IV SOLN: 500.0000 mL | INTRAVENOUS | Status: DC

## 2011-10-06 NOTE — Op Note (Signed)
[facility] [address] [place], [place]  [address]  COLONOSCOPY PROCEDURE REPORT  PATIENT:  Cheryl Patterson, Cheryl Patterson  MR#:  [id number] BIRTHDATE:  1970/11/01, 41 yrs. old  GENDER:  female ENDOSCOPIST:  Rachael Fee, MD PROCEDURE DATE:  10/06/2011 PROCEDURE:  Colonoscopy with snare polypectomy ASA CLASS:  Class II INDICATIONS:  cramping, intermittent loose stools MEDICATIONS:   Fentanyl 75 mcg IV, These medications were titrated to patient response per physician's verbal order, Versed 5 mg IV  DESCRIPTION OF PROCEDURE:   After the risks benefits and alternatives of the procedure were thoroughly explained, informed consent was obtained.  Digital rectal exam was performed and revealed no rectal masses.   The LB [id number]-[id number] endoscope was introduced through the anus and advanced to the terminal ileum which was intubated for a short distance, without limitations. The quality of the prep was good..  The instrument was then slowly withdrawn as the colon was fully examined. <<PROCEDUREIMAGES>> FINDINGS:  The terminal ileum appeared normal (see image2).  A diminutive polyp was found in the sigmoid colon (see image4 and image5).  This was otherwise a normal examination of the colon (see image3, image1, and image7).   Retroflexed views in the rectum revealed no abnormalities. COMPLICATIONS:  None  ENDOSCOPIC IMPRESSION: 1) Normal terminal ileum 2) Diminutive polyp in the sigmoid colon, removed and sent to pathology 3) Otherwise normal examination  RECOMMENDATIONS: 1) If the polyp(s) removed today are proven to be adenomatous (pre-cancerous) polyps, you will need a repeat colonoscopy in 5 years. Otherwise you should continue to follow colorectal cancer screening guidelines for "routine risk" patients with colonoscopy in 10 years. You will receive a letter within 1-2 weeks with the results of your biopsy as well as final recommendations. Please call my office if you have  not received a letter after 3 weeks. 2) You should continue twice daily antispasm meds, it seems like this is helping  ______________________________ Rachael Fee, MD  n. eSIGNED:   Rachael Fee at 10/06/2011 03:01 PM  [name], [phone]

## 2011-10-06 NOTE — Progress Notes (Signed)
Patient did not experience any of the following events: a burn prior to discharge; a fall within the facility; wrong site/side/patient/procedure/implant event; or a hospital transfer or hospital admission upon discharge from the facility. (G8907) Patient did not have preoperative order for IV antibiotic SSI prophylaxis. (G8918)  

## 2011-10-06 NOTE — Patient Instructions (Addendum)
YOU HAD AN ENDOSCOPIC PROCEDURE TODAY AT THE [facility]: Refer to the procedure report that was given to you for any specific questions about what was found during the examination.  If the procedure report does not answer your questions, please call your gastroenterologist to clarify.  If you requested that your care partner not be given the details of your procedure findings, then the procedure report has been included in a sealed envelope for you to review at your convenience later.  YOU SHOULD EXPECT: Some feelings of bloating in the abdomen. Passage of more gas than usual.  Walking can help get rid of the air that was put into your GI tract during the procedure and reduce the bloating. If you had a lower endoscopy (such as a colonoscopy or flexible sigmoidoscopy) you may notice spotting of blood in your stool or on the toilet paper. If you underwent a bowel prep for your procedure, then you may not have a normal bowel movement for a few days.  DIET: Your first meal following the procedure should be a light meal and then it is ok to progress to your normal diet.  A half-sandwich or bowl of soup is an example of a good first meal.  Heavy or fried foods are harder to digest and may make you feel nauseous or bloated.  Likewise meals heavy in dairy and vegetables can cause extra gas to form and this can also increase the bloating.  Drink plenty of fluids but you should avoid alcoholic beverages for 24 hours.  ACTIVITY: Your care partner should take you home directly after the procedure.  You should plan to take it easy, moving slowly for the rest of the day.  You can resume normal activity the day after the procedure however you should NOT DRIVE or use heavy machinery for 24 hours (because of the sedation medicines used during the test).    SYMPTOMS TO REPORT IMMEDIATELY: A gastroenterologist can be reached at any hour.  During normal business hours, 8:30 AM to 5:00 PM Monday through Friday,  call [phone].  After hours and on weekends, please call the GI answering service at [phone] who will take a message and have the physician on call contact you.   Following lower endoscopy (colonoscopy or flexible sigmoidoscopy):  Excessive amounts of blood in the stool  Significant tenderness or worsening of abdominal pains  Swelling of the abdomen that is new, acute  Fever of 100F or higher  FOLLOW UP: If any biopsies were taken you will be contacted by phone or by letter within the next 1-3 weeks.  Call your gastroenterologist if you have not heard about the biopsies in 3 weeks.  Our staff will call the home number listed on your records the next business day following your procedure to check on you and address any questions or concerns that you may have at that time regarding the information given to you following your procedure. This is a courtesy call and so if there is no answer at the home number and we have not heard from you through the emergency physician on call, we will assume that you have returned to your regular daily activities without incident.  SIGNATURES/CONFIDENTIALITY: You and/or your care partner have signed paperwork which will be entered into your electronic medical record.  These signatures attest to the fact that that the information above on your After Visit Summary has been reviewed and is understood.  Full responsibility of the confidentiality of this   discharge information lies with you and/or your care-partner.    Resume medications. Information given on polyps with discharge instructions. 

## 2011-10-07 ENCOUNTER — Telehealth: Payer: Self-pay | Admitting: *Deleted

## 2011-10-07 NOTE — Telephone Encounter (Signed)
  Follow up Call-  Call back number 10/06/2011  Post procedure Call Back phone  # [phone] cell  Permission to leave phone message Yes     Patient questions:  Do you have a fever, pain , or abdominal swelling? no Pain Score  0 *  Have you tolerated food without any problems? yes  Have you been able to return to your normal activities? yes  Do you have any questions about your discharge instructions: Diet   no Medications  no Follow up visit  no  Do you have questions or concerns about your Care? no  Actions: * If pain score is 4 or above: No action needed, pain <4.

## 2011-10-13 ENCOUNTER — Encounter: Payer: Self-pay | Admitting: Gastroenterology

## 2011-11-16 ENCOUNTER — Ambulatory Visit (INDEPENDENT_AMBULATORY_CARE_PROVIDER_SITE_OTHER): Payer: Managed Care, Other (non HMO) | Admitting: Family Medicine

## 2011-11-16 ENCOUNTER — Encounter: Payer: Self-pay | Admitting: Family Medicine

## 2011-11-16 VITALS — BP 100/62 | HR 93 | Temp 98.7°F | Wt 172.2 lb

## 2011-11-16 DIAGNOSIS — J40 Bronchitis, not specified as acute or chronic: Secondary | ICD-10-CM

## 2011-11-16 DIAGNOSIS — J329 Chronic sinusitis, unspecified: Secondary | ICD-10-CM

## 2011-11-16 MED ORDER — FLUTICASONE-SALMETEROL 250-50 MCG/DOSE IN AEPB: INHALATION_SPRAY | RESPIRATORY_TRACT | Status: DC

## 2011-11-16 MED ORDER — AZELASTINE-FLUTICASONE 137-50 MCG/ACT NA SUSP: 1.0000 | Freq: Two times a day (BID) | NASAL | Status: DC

## 2011-11-16 MED ORDER — CEFUROXIME AXETIL 500 MG PO TABS: 500.0000 mg | ORAL_TABLET | Freq: Two times a day (BID) | ORAL | Status: AC

## 2011-11-16 NOTE — Patient Instructions (Signed)

## 2011-11-16 NOTE — Progress Notes (Signed)
  Subjective:     Cheryl Patterson is a 41 y.o. female who presents for evaluation of sinus pain. Symptoms include: congestion, cough, facial pain, headaches, nasal congestion, purulent rhinorrhea and sinus pressure. Onset of symptoms was 1 week ago. Symptoms have been gradually worsening since that time. Past history is significant for no history of pneumonia or bronchitis. Patient is a non-smoker.  The following portions of the patient's history were reviewed and updated as appropriate: allergies, current medications, past family history, past medical history, past social history, past surgical history and problem list.  Review of Systems Pertinent items are noted in HPI.   Objective:    BP 100/62  Pulse 93  Temp 98.7 F (37.1 C) (Oral)  Wt 172 lb 3.2 oz (78.109 kg)  SpO2 98% General appearance: alert, cooperative, appears stated age and mild distress Ears: normal TM and external ear canal left ear and abnormal TM right ear - erythematous and air-fluid level Nose: green discharge, moderate congestion, turbinates red, swollen, sinus tenderness bilateral Throat: lips, mucosa, and tongue normal; teeth and gums normal Neck: mild anterior cervical adenopathy, supple, symmetrical, trachea midline and thyroid not enlarged, symmetric, no tenderness/mass/nodules Lungs: diminished breath sounds bilaterally    Assessment:    Acute bacterial sinusitis. with some broncitis   Plan:    Nasal steroids per medication orders. Antihistamines per medication orders. Ceftin per medication orders.  advair 250 dymista

## 2011-12-01 ENCOUNTER — Telehealth: Payer: Self-pay | Admitting: Family Medicine

## 2011-12-01 NOTE — Telephone Encounter (Signed)
noted 

## 2011-12-01 NOTE — Telephone Encounter (Signed)
Caller: Peyson/Patient; Patient Name: Cheryl Patterson; PCP: Lelon Perla.; Best Callback Phone Number: [phone].  Called  to request additional antibiotics for ongoing cold symtoms with chest congestion and worsening cough. Onset 11/12/11.  Afebrile. Seen 11/16/11; Completed Ceftin. LMP 11/17/11. BTL/not sexually active.  Hard to take a deep breath due to chest feels tight. No audible wheezing.  Using Advair sporatically for cough; feels less short of breath after Advair.  Coughing up thick yellow sputum.  Advised to see MD within 24 hours for productive cough with colored sputum per Cough Guideline. Instructed to call 12/02/11 AM for appointment

## 2011-12-01 NOTE — Telephone Encounter (Signed)
Left message to call office to schedule Pt for OV and discuss symptoms.

## 2011-12-17 ENCOUNTER — Telehealth: Payer: Self-pay | Admitting: Family Medicine

## 2011-12-17 MED ORDER — AMPHETAMINE-DEXTROAMPHETAMINE 30 MG PO TABS: 30.0000 mg | ORAL_TABLET | Freq: Two times a day (BID) | ORAL | Status: DC

## 2011-12-17 NOTE — Telephone Encounter (Signed)
Pt would like refill on Adderall. Call work # when ready for pick up.

## 2011-12-17 NOTE — Telephone Encounter (Signed)
msg left making patient aware Rx ready for pick up    KP 

## 2012-04-03 ENCOUNTER — Other Ambulatory Visit: Payer: Self-pay | Admitting: *Deleted

## 2012-04-03 NOTE — Telephone Encounter (Signed)
Patient called triage line, want to know is Aderrall she currently takes is available in a "generic time release" as she has heard about. If so  Would like to change to that. Advise pt please.

## 2012-04-03 NOTE — Telephone Encounter (Signed)
Patient is requesting a time released Adderall. Please advise    KP

## 2012-04-03 NOTE — Telephone Encounter (Signed)
Please advise      KP 

## 2012-04-03 NOTE — Telephone Encounter (Signed)
We write the rx so that if it is available they would fill it with generic

## 2012-04-04 MED ORDER — AMPHETAMINE-DEXTROAMPHET ER 30 MG PO CP24: 30.0000 mg | ORAL_CAPSULE | ORAL | Status: DC

## 2012-04-04 NOTE — Telephone Encounter (Signed)
Vm left advising Rx ready for pick up.    KP 

## 2012-04-04 NOTE — Telephone Encounter (Signed)
adderall xr 30 mg  #30  1 po qam

## 2012-04-06 ENCOUNTER — Ambulatory Visit: Payer: Managed Care, Other (non HMO) | Admitting: Internal Medicine

## 2012-04-06 DIAGNOSIS — Z0289 Encounter for other administrative examinations: Secondary | ICD-10-CM

## 2012-05-19 ENCOUNTER — Inpatient Hospital Stay (HOSPITAL_COMMUNITY)
Admission: EM | Admit: 2012-05-19 | Discharge: 2012-05-22 | DRG: 872 | Disposition: A | Discharge status: Home / Self Care | Payer: Managed Care, Other (non HMO) | Attending: Internal Medicine | Admitting: Internal Medicine

## 2012-05-19 ENCOUNTER — Emergency Department (HOSPITAL_COMMUNITY): Payer: Managed Care, Other (non HMO)

## 2012-05-19 ENCOUNTER — Encounter (HOSPITAL_COMMUNITY): Payer: Self-pay | Admitting: Emergency Medicine

## 2012-05-19 DIAGNOSIS — F329 Major depressive disorder, single episode, unspecified: Secondary | ICD-10-CM

## 2012-05-19 DIAGNOSIS — R5381 Other malaise: Secondary | ICD-10-CM

## 2012-05-19 DIAGNOSIS — A419 Sepsis, unspecified organism: Secondary | ICD-10-CM

## 2012-05-19 DIAGNOSIS — N189 Chronic kidney disease, unspecified: Secondary | ICD-10-CM | POA: Diagnosis present

## 2012-05-19 DIAGNOSIS — D1739 Benign lipomatous neoplasm of skin and subcutaneous tissue of other sites: Secondary | ICD-10-CM

## 2012-05-19 DIAGNOSIS — F988 Other specified behavioral and emotional disorders with onset usually occurring in childhood and adolescence: Secondary | ICD-10-CM

## 2012-05-19 DIAGNOSIS — R112 Nausea with vomiting, unspecified: Secondary | ICD-10-CM

## 2012-05-19 DIAGNOSIS — N201 Calculus of ureter: Secondary | ICD-10-CM

## 2012-05-19 DIAGNOSIS — R7989 Other specified abnormal findings of blood chemistry: Secondary | ICD-10-CM

## 2012-05-19 DIAGNOSIS — E876 Hypokalemia: Secondary | ICD-10-CM

## 2012-05-19 DIAGNOSIS — I959 Hypotension, unspecified: Secondary | ICD-10-CM

## 2012-05-19 DIAGNOSIS — N12 Tubulo-interstitial nephritis, not specified as acute or chronic: Secondary | ICD-10-CM | POA: Diagnosis present

## 2012-05-19 DIAGNOSIS — IMO0001 Reserved for inherently not codable concepts without codable children: Secondary | ICD-10-CM

## 2012-05-19 DIAGNOSIS — J309 Allergic rhinitis, unspecified: Secondary | ICD-10-CM | POA: Diagnosis present

## 2012-05-19 DIAGNOSIS — F411 Generalized anxiety disorder: Secondary | ICD-10-CM | POA: Diagnosis present

## 2012-05-19 DIAGNOSIS — N390 Urinary tract infection, site not specified: Secondary | ICD-10-CM

## 2012-05-19 DIAGNOSIS — Z87442 Personal history of urinary calculi: Secondary | ICD-10-CM

## 2012-05-19 DIAGNOSIS — R000 Tachycardia, unspecified: Secondary | ICD-10-CM

## 2012-05-19 DIAGNOSIS — B009 Herpesviral infection, unspecified: Secondary | ICD-10-CM

## 2012-05-19 DIAGNOSIS — E663 Overweight: Secondary | ICD-10-CM

## 2012-05-19 DIAGNOSIS — K219 Gastro-esophageal reflux disease without esophagitis: Secondary | ICD-10-CM

## 2012-05-19 DIAGNOSIS — Z885 Allergy status to narcotic agent status: Secondary | ICD-10-CM

## 2012-05-19 DIAGNOSIS — A4159 Other Gram-negative sepsis: Principal | ICD-10-CM | POA: Diagnosis present

## 2012-05-19 DIAGNOSIS — IMO0002 Reserved for concepts with insufficient information to code with codable children: Secondary | ICD-10-CM

## 2012-05-19 DIAGNOSIS — R110 Nausea: Secondary | ICD-10-CM

## 2012-05-19 DIAGNOSIS — R509 Fever, unspecified: Secondary | ICD-10-CM

## 2012-05-19 DIAGNOSIS — Z23 Encounter for immunization: Secondary | ICD-10-CM

## 2012-05-19 DIAGNOSIS — F3130 Bipolar disorder, current episode depressed, mild or moderate severity, unspecified: Secondary | ICD-10-CM | POA: Diagnosis present

## 2012-05-19 DIAGNOSIS — E785 Hyperlipidemia, unspecified: Secondary | ICD-10-CM

## 2012-05-19 DIAGNOSIS — R6889 Other general symptoms and signs: Secondary | ICD-10-CM

## 2012-05-19 DIAGNOSIS — F3289 Other specified depressive episodes: Secondary | ICD-10-CM

## 2012-05-19 MED ORDER — SODIUM CHLORIDE 0.9 % IV BOLUS (SEPSIS)
1000.0000 mL | Freq: Once | INTRAVENOUS | Status: AC
  Administered 2012-05-19: 1000 mL via INTRAVENOUS

## 2012-05-19 MED ORDER — ONDANSETRON HCL 4 MG/2ML IJ SOLN
4.0000 mg | Freq: Once | INTRAMUSCULAR | Status: AC
  Administered 2012-05-19: 4 mg via INTRAVENOUS
  Filled 2012-05-19: qty 2

## 2012-05-19 MED ORDER — SODIUM CHLORIDE 0.9 % IV BOLUS (SEPSIS)
1000.0000 mL | Freq: Once | INTRAVENOUS | Status: AC
  Administered 2012-05-20: 1000 mL via INTRAVENOUS

## 2012-05-19 MED ORDER — ACETAMINOPHEN 325 MG PO TABS
650.0000 mg | ORAL_TABLET | Freq: Once | ORAL | Status: AC
  Administered 2012-05-19: 650 mg via ORAL
  Filled 2012-05-19: qty 2

## 2012-05-19 MED ORDER — ALBUTEROL SULFATE HFA 108 (90 BASE) MCG/ACT IN AERS
2.0000 | INHALATION_SPRAY | RESPIRATORY_TRACT | Status: DC | PRN
  Filled 2012-05-19: qty 6.7

## 2012-05-19 NOTE — ED Provider Notes (Signed)
History   This chart was scribed for non-physician practitioner working with Dione Booze, MD by Gerlean Ren, ED Scribe. This patient was seen in room WTR8/WTR8 and the patient's care was started at 10:53 PM.    CSN: [id number]  Arrival date & time 05/19/12  2129   First MD Initiated Contact with Patient 05/19/12 2234      Chief Complaint  Patient presents with  . Generalized Body Aches  . Nausea  . Chills  . Fever    The history is provided by the patient and medical records. No language interpreter was used.   Cheryl Patterson is a 42 y.o. female with h/o bipolar depression and CKD who presents to the Emergency Department complaining of 2 days of waxing-and-waning myalgias rated as 8/10 with associated dull, pounding HA rated as 10/10, chills, fever, bilateral burning otalgia, chest tightness causing mild dyspnea, nausea, cramping abdominal pain, and non-bloody, non-bilious emesis 01/29 and 01/30 but no episodes today.  Pt denies sinus pressure, sore throat, cough, diarrhea.  No OCM used.  No flu shot this year.  Pt reports sick contacts at work.  Pt denies tobacco and alcohol use.    Past Medical History  Diagnosis Date  . Hyperlipidemia   . Chronic headaches   . Depression     [facility]osp [facility]---suicidal ideation  . Bipolar depression   . Allergy   . Anxiety   . Chronic kidney disease     kidney stones    Past Surgical History  Procedure Date  . Tubal ligation   . Leep 2005  . Breast surgery     BREAST LUMP REMOVAL ??WHICH BREAST  . Other surgical history     RENAL STONE REMOVAL  . Mouth surgery   . Lipoma excision   . Kidney stones     Family History  Problem Relation Age of Onset  . Hypertension    . Stroke Paternal Grandmother   . Hypertension Paternal Grandmother   . Heart disease Paternal Grandmother   . Breast cancer Paternal Aunt   . Stomach cancer Maternal Aunt   . Colon cancer Maternal Aunt   . Uterine cancer Maternal Aunt   . Brain cancer  Maternal Aunt   . Diabetes Maternal Grandfather   . Breast cancer Mother   . Cancer Maternal Uncle     COLON  . Esophageal cancer Neg Hx   . Rectal cancer Neg Hx     History  Substance Use Topics  . Smoking status: Never Smoker   . Smokeless tobacco: Never Used  . Alcohol Use: No    OB History    Grav Para Term Preterm Abortions TAB SAB Ect Mult Living   3 2   1  1          Review of Systems  Constitutional: Positive for fever and chills. Negative for diaphoresis, appetite change, fatigue and unexpected weight change.  HENT: Positive for ear pain. Negative for sore throat, rhinorrhea, mouth sores, neck stiffness and sinus pressure.   Eyes: Negative for visual disturbance.  Respiratory: Positive for chest tightness and shortness of breath. Negative for cough and wheezing.   Cardiovascular: Negative for chest pain.  Gastrointestinal: Positive for nausea, vomiting and abdominal pain. Negative for diarrhea and constipation.  Genitourinary: Negative for dysuria, urgency, frequency and hematuria.  Musculoskeletal: Negative for back pain.  Skin: Negative for rash.  Neurological: Positive for headaches. Negative for syncope and light-headedness.  Hematological: Does not bruise/bleed easily.  Psychiatric/Behavioral: Negative  for sleep disturbance. The patient is not nervous/anxious.   All other systems reviewed and are negative.    Allergies  Codeine and Other  Home Medications   Current Outpatient Rx  Name  Route  Sig  Dispense  Refill  . AMPHETAMINE-DEXTROAMPHET ER 30 MG PO CP24   Oral   Take 1 capsule (30 mg total) by mouth every morning.   30 capsule   0   . FLUTICASONE-SALMETEROL 250-50 MCG/DOSE IN AEPB   Inhalation   Inhale 1 puff into the lungs every 12 (twelve) hours as needed. For shortness of breath           BP 101/54  Pulse 133  Temp 100.9 F (38.3 C) (Oral)  SpO2 97%  LMP 05/03/2012  Physical Exam  Nursing note and vitals  reviewed. Constitutional: She is oriented to person, place, and time. She appears well-developed and well-nourished. No distress.  HENT:  Head: Normocephalic and atraumatic.  Mouth/Throat: No oropharyngeal exudate.       Bilateral TMs erythematous and bulging with middle effusion and blunted light reflex Oropharynx erythematous with no edema and no exudates  Eyes: Conjunctivae normal and EOM are normal. Pupils are equal, round, and reactive to light. No scleral icterus.  Neck: Normal range of motion. Neck supple.  Cardiovascular: Regular rhythm and intact distal pulses.        Tachycardic, good peripheral pulses  Pulmonary/Chest: Effort normal and breath sounds normal. No respiratory distress. She has no wheezes.       Breath sounds clear but diminished throughout  Abdominal: Soft. Bowel sounds are normal. She exhibits no mass. There is tenderness. There is no rebound and no guarding.       Generalized abdominal tenderness throughout, no focal tenderness.  Musculoskeletal: Normal range of motion. She exhibits no edema.  Lymphadenopathy:    She has no cervical adenopathy.  Neurological: She is alert and oriented to person, place, and time.       Speech is clear and goal oriented Moves extremities without ataxia  Skin: Skin is warm and dry. She is not diaphoretic.  Psychiatric: She has a normal mood and affect.    ED Course  Procedures (including critical care time) DIAGNOSTIC STUDIES: Oxygen Saturation is 97% on room air, adequate by my interpretation.    COORDINATION OF CARE: 11:02 PM- Patient informed of clinical course, understands medical decision-making process, and agrees with plan.  1. Tachycardia   2. Flu-like symptoms   3. Fever   4. Hypotension   5. Hypokalemia       MDM  Stann Mainland Horton presents with 2 days of influenza-like illness along with nausea and vomiting. Patient presents febrile, tachycardic with the general malaise and body aches. Patient given  acetaminophen with decrease in temperature however she remains tachycardic. Normal saline initiated and after 750 cc of fluid patient became hypotensive at 80s/ 40s.  Patient states this time she does not feel well.  Given 2 L of fluid with blood pressure only rising to 92/49. His no longer tachycardic still stating she's not feel well. Labs obtained.  Patient without leukocytosis normal lipase. CMP with hypokalemia hyponatremia and hypochloremia. Hypokalemia at 2.9 and with increased fluid he will continue to drop. Potassium replacement in the department. Discussed the patient with Samuel Jester, M.D. she agrees that admission is warranted at this time.  Lactic acid, UA, pregnancy, influenza and blood cultures pending.  [facility] will admit.    I personally performed the services described in this  documentation, which was scribed in my presence. The recorded information has been reviewed and is accurate.    Dahlia Client Tashawna Thom, PA-C 05/20/12 [phone]

## 2012-05-19 NOTE — ED Notes (Signed)
Pt c/o chills, body aches, fever, and nausea x2 days.

## 2012-05-20 ENCOUNTER — Inpatient Hospital Stay (HOSPITAL_COMMUNITY): Payer: Managed Care, Other (non HMO)

## 2012-05-20 ENCOUNTER — Encounter (HOSPITAL_COMMUNITY): Payer: Self-pay | Admitting: Internal Medicine

## 2012-05-20 DIAGNOSIS — F329 Major depressive disorder, single episode, unspecified: Secondary | ICD-10-CM

## 2012-05-20 DIAGNOSIS — N201 Calculus of ureter: Secondary | ICD-10-CM | POA: Diagnosis present

## 2012-05-20 DIAGNOSIS — E876 Hypokalemia: Secondary | ICD-10-CM

## 2012-05-20 DIAGNOSIS — A419 Sepsis, unspecified organism: Secondary | ICD-10-CM | POA: Diagnosis present

## 2012-05-20 DIAGNOSIS — J111 Influenza due to unidentified influenza virus with other respiratory manifestations: Secondary | ICD-10-CM

## 2012-05-20 DIAGNOSIS — N390 Urinary tract infection, site not specified: Secondary | ICD-10-CM | POA: Diagnosis present

## 2012-05-20 DIAGNOSIS — R509 Fever, unspecified: Secondary | ICD-10-CM

## 2012-05-20 DIAGNOSIS — R112 Nausea with vomiting, unspecified: Secondary | ICD-10-CM

## 2012-05-20 DIAGNOSIS — R000 Tachycardia, unspecified: Secondary | ICD-10-CM

## 2012-05-20 DIAGNOSIS — F988 Other specified behavioral and emotional disorders with onset usually occurring in childhood and adolescence: Secondary | ICD-10-CM

## 2012-05-20 DIAGNOSIS — F3289 Other specified depressive episodes: Secondary | ICD-10-CM

## 2012-05-20 LAB — COMPREHENSIVE METABOLIC PANEL
AST: 19 U/L (ref 0–37)
Alkaline Phosphatase: 67 U/L (ref 39–117)
CO2: 23 mEq/L (ref 19–32)
Chloride: 95 mEq/L — ABNORMAL LOW (ref 96–112)
Creatinine, Ser: 0.87 mg/dL (ref 0.50–1.10)
GFR calc non Af Amer: 81 mL/min — ABNORMAL LOW (ref >90)
Potassium: 2.9 mEq/L — ABNORMAL LOW (ref 3.5–5.1)
Total Bilirubin: 0.3 mg/dL (ref 0.3–1.2)

## 2012-05-20 LAB — URINALYSIS, ROUTINE W REFLEX MICROSCOPIC
Bilirubin Urine: NEGATIVE
Glucose, UA: NEGATIVE mg/dL
Nitrite: NEGATIVE
Specific Gravity, Urine: 1.008 (ref 1.005–1.030)
pH: 6 (ref 5.0–8.0)

## 2012-05-20 LAB — INFLUENZA PANEL BY PCR (TYPE A & B)
H1N1 flu by pcr: NOT DETECTED
Influenza B By PCR: NEGATIVE

## 2012-05-20 LAB — CBC WITH DIFFERENTIAL/PLATELET
Basophils Absolute: 0 10*3/uL (ref 0.0–0.1)
HCT: 35.8 % — ABNORMAL LOW (ref 36.0–46.0)
Hemoglobin: 12.5 g/dL (ref 12.0–15.0)
Lymphocytes Relative: 4 % — ABNORMAL LOW (ref 12–46)
Monocytes Absolute: 0.2 10*3/uL (ref 0.1–1.0)
Monocytes Relative: 2 % — ABNORMAL LOW (ref 3–12)
Neutro Abs: 7.9 10*3/uL — ABNORMAL HIGH (ref 1.7–7.7)
Neutrophils Relative %: 93 % — ABNORMAL HIGH (ref 43–77)
RDW: 12.6 % (ref 11.5–15.5)
WBC: 8.4 10*3/uL (ref 4.0–10.5)

## 2012-05-20 LAB — LACTIC ACID, PLASMA: Lactic Acid, Venous: 0.8 mmol/L (ref 0.5–2.2)

## 2012-05-20 LAB — URINE MICROSCOPIC-ADD ON

## 2012-05-20 LAB — POTASSIUM: Potassium: 3.5 mEq/L (ref 3.5–5.1)

## 2012-05-20 MED ORDER — CEFTRIAXONE SODIUM 1 G IJ SOLR: 1.0000 g | INTRAMUSCULAR | Status: DC

## 2012-05-20 MED ORDER — ENOXAPARIN SODIUM 40 MG/0.4ML ~~LOC~~ SOLN
40.0000 mg | SUBCUTANEOUS | Status: DC
  Filled 2012-05-20: qty 0.4

## 2012-05-20 MED ORDER — SODIUM CHLORIDE 0.9 % IV BOLUS (SEPSIS)
1000.0000 mL | Freq: Once | INTRAVENOUS | Status: AC
  Administered 2012-05-20: 1000 mL via INTRAVENOUS

## 2012-05-20 MED ORDER — TAMSULOSIN HCL 0.4 MG PO CAPS
0.4000 mg | ORAL_CAPSULE | Freq: Every day | ORAL | Status: DC
  Administered 2012-05-20 – 2012-05-22 (×3): 0.4 mg via ORAL
  Filled 2012-05-20 (×3): qty 1

## 2012-05-20 MED ORDER — KETOROLAC TROMETHAMINE 30 MG/ML IJ SOLN
30.0000 mg | Freq: Four times a day (QID) | INTRAMUSCULAR | Status: AC | PRN
  Administered 2012-05-20: 30 mg via INTRAVENOUS
  Filled 2012-05-20: qty 1

## 2012-05-20 MED ORDER — POTASSIUM CHLORIDE IN NACL 20-0.9 MEQ/L-% IV SOLN
INTRAVENOUS | Status: DC
  Administered 2012-05-20 – 2012-05-21 (×6): via INTRAVENOUS
  Filled 2012-05-20 (×9): qty 1000

## 2012-05-20 MED ORDER — ONDANSETRON HCL 4 MG PO TABS: 4.0000 mg | ORAL_TABLET | Freq: Four times a day (QID) | ORAL | Status: DC | PRN

## 2012-05-20 MED ORDER — PIPERACILLIN-TAZOBACTAM 3.375 G IVPB
3.3750 g | Freq: Three times a day (TID) | INTRAVENOUS | Status: DC
  Administered 2012-05-20 – 2012-05-22 (×7): 3.375 g via INTRAVENOUS
  Filled 2012-05-20 (×10): qty 50

## 2012-05-20 MED ORDER — POTASSIUM CHLORIDE 20 MEQ/15ML (10%) PO LIQD
40.0000 meq | Freq: Once | ORAL | Status: AC
  Administered 2012-05-20: 40 meq via ORAL
  Filled 2012-05-20: qty 30

## 2012-05-20 MED ORDER — INFLUENZA VIRUS VACC SPLIT PF IM SUSP: 0.5000 mL | INTRAMUSCULAR | Status: DC | PRN

## 2012-05-20 MED ORDER — ACETAMINOPHEN 325 MG PO TABS
650.0000 mg | ORAL_TABLET | Freq: Four times a day (QID) | ORAL | Status: DC | PRN
  Administered 2012-05-20 – 2012-05-21 (×3): 650 mg via ORAL
  Filled 2012-05-20 (×3): qty 2

## 2012-05-20 MED ORDER — ACETAMINOPHEN 650 MG RE SUPP: 650.0000 mg | Freq: Four times a day (QID) | RECTAL | Status: DC | PRN

## 2012-05-20 MED ORDER — POTASSIUM CHLORIDE 10 MEQ/100ML IV SOLN
10.0000 meq | Freq: Once | INTRAVENOUS | Status: AC
  Administered 2012-05-20: 10 meq via INTRAVENOUS
  Filled 2012-05-20: qty 100

## 2012-05-20 MED ORDER — OSELTAMIVIR PHOSPHATE 75 MG PO CAPS
75.0000 mg | ORAL_CAPSULE | Freq: Two times a day (BID) | ORAL | Status: DC
  Administered 2012-05-20 (×2): 75 mg via ORAL
  Filled 2012-05-20 (×5): qty 1

## 2012-05-20 MED ORDER — DEXTROSE 5 % IV SOLN
1.0000 g | INTRAVENOUS | Status: DC
  Administered 2012-05-20: 1 g via INTRAVENOUS
  Filled 2012-05-20: qty 10

## 2012-05-20 MED ORDER — ONDANSETRON HCL 4 MG/2ML IJ SOLN
4.0000 mg | Freq: Four times a day (QID) | INTRAMUSCULAR | Status: DC | PRN
  Administered 2012-05-21: 4 mg via INTRAVENOUS
  Filled 2012-05-20: qty 2

## 2012-05-20 MED ORDER — SODIUM CHLORIDE 0.9 % IJ SOLN
3.0000 mL | Freq: Two times a day (BID) | INTRAMUSCULAR | Status: DC
  Administered 2012-05-20: 3 mL via INTRAVENOUS

## 2012-05-20 MED ORDER — MOMETASONE FURO-FORMOTEROL FUM 100-5 MCG/ACT IN AERO
2.0000 | INHALATION_SPRAY | Freq: Two times a day (BID) | RESPIRATORY_TRACT | Status: DC
  Administered 2012-05-20 – 2012-05-21 (×3): 2 via RESPIRATORY_TRACT
  Filled 2012-05-20: qty 8.8

## 2012-05-20 MED ORDER — PANTOPRAZOLE SODIUM 40 MG PO TBEC
40.0000 mg | DELAYED_RELEASE_TABLET | Freq: Every day | ORAL | Status: DC
  Administered 2012-05-20 – 2012-05-22 (×3): 40 mg via ORAL
  Filled 2012-05-20 (×3): qty 1

## 2012-05-20 NOTE — Consult Note (Signed)
Urology Consult  Referring physician:   Dr. Toniann Fail, [facility] Reason for referral:   Kidney stone  Chief Complaint:  Right flank pain  History of Present Illness:    Cheryl Patterson is a 42 y.o. female with h/o bipolar depression and CKD who presented to the Emergency Department complaining of 2 days of waxing-and-waning myalgias,  rated as 8/10,  with associated dull, pounding HA- rated as 10/10 with  chills, fever, bilateral burning otalgia,  And chest tightness-causing mild dyspnea, nausea, cramping abdominal pain, and non-bloody, non-bilious emesis 01/29 and 01/30. Pt denies sinus pressure, sore throat, cough, diarrhea. No OCM used. No flu shot this year.   nausea and vomiting.     Further evaluation, after volume resuscitation revealed that her symptoms started 2 days pta,  with right flank pain. Flank pain was constant dull aching associated nausea and vomiting. Her flank pain and nausea and vomiting improved but she  continued to have fever chills. CT revealed 5mm x 3mm stone at Right U-V junction. Urology is now consulted for further Rx.       Past Medical History  Diagnosis Date  . Hyperlipidemia   . Chronic headaches   . Depression     Hosp 1993 [facility]---suicidal ideation  . Bipolar depression   . Allergy   . Anxiety   . Chronic kidney disease     kidney stones   Past Surgical History  Procedure Date  . Tubal ligation   . Leep 2005  . Breast surgery     BREAST LUMP REMOVAL ??WHICH BREAST  . Other surgical history     RENAL STONE REMOVAL  . Mouth surgery   . Lipoma excision   . Kidney stones     Medications: I have reviewed the patient's current medications. Allergies:  Allergies  Allergen Reactions  . Codeine     Itchy throat, whelps on skin  . Other Swelling and Rash    tomatoes    Family History  Problem Relation Age of Onset  . Hypertension    . Stroke Paternal Grandmother   . Hypertension Paternal Grandmother   . Heart disease Paternal  Grandmother   . Breast cancer Paternal Aunt   . Stomach cancer Maternal Aunt   . Colon cancer Maternal Aunt   . Uterine cancer Maternal Aunt   . Brain cancer Maternal Aunt   . Diabetes Maternal Grandfather   . Breast cancer Mother   . Cancer Maternal Uncle     COLON  . Esophageal cancer Neg Hx   . Rectal cancer Neg Hx    Social History:  reports that she has never smoked. She has never used smokeless tobacco. She reports that she does not drink alcohol or use illicit drugs.  ROS: All systems are reviewed and negative except as noted. Resolved nausea.   Physical Exam:  Vital signs in last 24 hours: Temp:  [99.7 F (37.6 C)-100.9 F (38.3 C)] 99.8 F (37.7 C) (02/01 0630) Pulse Rate:  [52-133] 79  (02/01 0958) Resp:  [16-22] 22  (02/01 0404) BP: (85-101)/(38-68) 96/44 mmHg (02/01 0958) SpO2:  [97 %-100 %] 100 % (02/01 0958) Weight:  [79 kg (174 lb 2.6 oz)] 79 kg (174 lb 2.6 oz) (02/01 0404)  Cardiovascular: Skin warm; not flushed Respiratory: Breaths quiet; no shortness of breath Abdomen: No masses. Soft + BS.  Neurological: Normal sensation to touch Musculoskeletal: Normal motor function arms and legs Lymphatics: No inguinal adenopathy Skin: No rashes Genitourinary: neg.  Laboratory Data:  Results for orders placed during the hospital encounter of 05/19/12 (from the past 72 hour(s))  CBC WITH DIFFERENTIAL     Status: Abnormal   Collection Time   05/20/12 12:01 AM      Component Value Range Comment   WBC 8.4  4.0 - 10.5 K/uL    RBC 4.23  3.87 - 5.11 MIL/uL    Hemoglobin 12.5  12.0 - 15.0 g/dL    HCT 16.1 (*) 09.6 - 46.0 %    MCV 84.6  78.0 - 100.0 fL    MCH 29.6  26.0 - 34.0 pg    MCHC 34.9  30.0 - 36.0 g/dL    RDW 04.5  40.9 - 81.1 %    Platelets 166  150 - 400 K/uL    Neutrophils Relative 93 (*) 43 - 77 %    Neutro Abs 7.9 (*) 1.7 - 7.7 K/uL    Lymphocytes Relative 4 (*) 12 - 46 %    Lymphs Abs 0.4 (*) 0.7 - 4.0 K/uL    Monocytes Relative 2 (*) 3 - 12 %     Monocytes Absolute 0.2  0.1 - 1.0 K/uL    Eosinophils Relative 0  0 - 5 %    Eosinophils Absolute 0.0  0.0 - 0.7 K/uL    Basophils Relative 0  0 - 1 %    Basophils Absolute 0.0  0.0 - 0.1 K/uL   COMPREHENSIVE METABOLIC PANEL     Status: Abnormal   Collection Time   05/20/12 12:01 AM      Component Value Range Comment   Sodium 131 (*) 135 - 145 mEq/L    Potassium 2.9 (*) 3.5 - 5.1 mEq/L    Chloride 95 (*) 96 - 112 mEq/L    CO2 23  19 - 32 mEq/L    Glucose, Bld 104 (*) 70 - 99 mg/dL    BUN 7  6 - 23 mg/dL    Creatinine, Ser 9.14  0.50 - 1.10 mg/dL    Calcium 7.9 (*) 8.4 - 10.5 mg/dL    Total Protein 6.0  6.0 - 8.3 g/dL    Albumin 2.9 (*) 3.5 - 5.2 g/dL    AST 19  0 - 37 U/L    ALT 12  0 - 35 U/L    Alkaline Phosphatase 67  39 - 117 U/L    Total Bilirubin 0.3  0.3 - 1.2 mg/dL    GFR calc non Af Amer 81 (*) >90 mL/min    GFR calc Af Amer >90  >90 mL/min   LIPASE, BLOOD     Status: Normal   Collection Time   05/20/12 12:01 AM      Component Value Range Comment   Lipase 37  11 - 59 U/L   LACTIC ACID, PLASMA     Status: Normal   Collection Time   05/20/12 12:33 AM      Component Value Range Comment   Lactic Acid, Venous 0.8  0.5 - 2.2 mmol/L   URINALYSIS, ROUTINE W REFLEX MICROSCOPIC     Status: Abnormal   Collection Time   05/20/12  1:37 AM      Component Value Range Comment   Color, Urine YELLOW  YELLOW    APPearance CLOUDY (*) CLEAR    Specific Gravity, Urine 1.008  1.005 - 1.030    pH 6.0  5.0 - 8.0    Glucose, UA NEGATIVE  NEGATIVE mg/dL    Hgb urine dipstick MODERATE (*) NEGATIVE  Bilirubin Urine NEGATIVE  NEGATIVE    Ketones, ur TRACE (*) NEGATIVE mg/dL    Protein, ur NEGATIVE  NEGATIVE mg/dL    Urobilinogen, UA 1.0  0.0 - 1.0 mg/dL    Nitrite NEGATIVE  NEGATIVE    Leukocytes, UA LARGE (*) NEGATIVE   PREGNANCY, URINE     Status: Normal   Collection Time   05/20/12  1:37 AM      Component Value Range Comment   Preg Test, Ur NEGATIVE  NEGATIVE   URINE MICROSCOPIC-ADD ON      Status: Abnormal   Collection Time   05/20/12  1:37 AM      Component Value Range Comment   WBC, UA 11-20  <3 WBC/hpf    RBC / HPF 0-2  <3 RBC/hpf    Bacteria, UA FEW (*) RARE   INFLUENZA PANEL BY PCR     Status: Normal   Collection Time   05/20/12  2:45 AM      Component Value Range Comment   Influenza A By PCR NEGATIVE  NEGATIVE    Influenza B By PCR NEGATIVE  NEGATIVE    H1N1 flu by pcr NOT DETECTED  NOT DETECTED   POTASSIUM     Status: Normal   Collection Time   05/20/12 10:26 AM      Component Value Range Comment   Potassium 3.5  3.5 - 5.1 mEq/L    No results found for this or any previous visit (from the past 240 hour(s)). Creatinine:  Basename 05/20/12 0001  CREATININE 0.87    Xrays: *RADIOLOGY REPORT*  Clinical Data: Right flank pain. Abnormal ultrasound. Kidney  stone.  CT ABDOMEN AND PELVIS WITHOUT CONTRAST  Technique: Multidetector CT imaging of the abdomen and pelvis was  performed following the standard protocol without intravenous  contrast.  Comparison: Ultrasound 05/20/2012.  Findings: Lung bases appear clear. Liver, spleen, pancreas,  adrenal glands and gallbladder appear within normal limits.  There is moderate right hydroureteronephrosis extending to the  right ureter vesicle junction. There is a 5 mm x 3 mm calculus at  the right UVJ. No residual collecting system calculi are present.  The left kidney appears normal. The small and large bowel normal.  Physiologic appearance of the uterus and adnexa.  IMPRESSION:  5 mm x 3 mm right ureter vesicle junction calculus with moderate  right hydroureteronephrosis.  Original Report Authenticated By: Andreas Newport, M.D.   Impression/Assessment:    Pt believes she does not drink enough water because her Adderal makes her not thirsty. She has stopped this medicine, and may need to restart it for her psych. Problem, or may need other med. She needs to make more urine-  Probably 1 gallon/dy. She now has passable  stone, and needs toradol, and flomax, and strainer. F/u  In office.   Plan:    Toradol 30mg  IV q 6h prn renal colic; Flomax 4mg  po/day; urine strainer; F/u [facility].   Isaias Dowson I 05/20/2012, 11:43 AM

## 2012-05-20 NOTE — Progress Notes (Signed)
Utilization review completed.  

## 2012-05-20 NOTE — Progress Notes (Signed)
Negative for flu, removed droplet precautions

## 2012-05-20 NOTE — Progress Notes (Signed)
Paged Md re: results of CT

## 2012-05-20 NOTE — H&P (Addendum)
Cheryl Patterson is an 42 y.o. female.   Patient was seen and examined on May 20, 2012. PCP - Dr. Loreen Freud. Chief Complaint: Fever chills nausea and vomiting. HPI: 42 year-old female presented with complaints of nausea vomiting and fever chills. Patient's symptoms started 2 days ago with right flank pain. Flank pain was constant dull aching associated nausea and vomiting. Her flank pain and nausea and vomiting improved but continued to have fever chills. Patient on arrival in the ER was found to be tachycardic and febrile. Patient also has generalized myalgias. Denies any chest pain shortness of breath or productive cough. Patient was initially found to be mildly hypotensive and 3 L of normal saline was given. Patient has been admitted for further management.  Past Medical History  Diagnosis Date  . Hyperlipidemia   . Chronic headaches   . Depression     Hosp 1993 [facility]---suicidal ideation  . Bipolar depression   . Allergy   . Anxiety   . Chronic kidney disease     kidney stones    Past Surgical History  Procedure Date  . Tubal ligation   . Leep 2005  . Breast surgery     BREAST LUMP REMOVAL ??WHICH BREAST  . Other surgical history     RENAL STONE REMOVAL  . Mouth surgery   . Lipoma excision   . Kidney stones     Family History  Problem Relation Age of Onset  . Hypertension    . Stroke Paternal Grandmother   . Hypertension Paternal Grandmother   . Heart disease Paternal Grandmother   . Breast cancer Paternal Aunt   . Stomach cancer Maternal Aunt   . Colon cancer Maternal Aunt   . Uterine cancer Maternal Aunt   . Brain cancer Maternal Aunt   . Diabetes Maternal Grandfather   . Breast cancer Mother   . Cancer Maternal Uncle     COLON  . Esophageal cancer Neg Hx   . Rectal cancer Neg Hx    Social History:  reports that she has never smoked. She has never used smokeless tobacco. She reports that she does not drink alcohol or use illicit  drugs.  Allergies:  Allergies  Allergen Reactions  . Codeine     Itchy throat, whelps on skin  . Other Swelling and Rash    tomatoes     (Not in a hospital admission)  Results for orders placed during the hospital encounter of 05/19/12 (from the past 48 hour(s))  CBC WITH DIFFERENTIAL     Status: Abnormal   Collection Time   05/20/12 12:01 AM      Component Value Range Comment   WBC 8.4  4.0 - 10.5 K/uL    RBC 4.23  3.87 - 5.11 MIL/uL    Hemoglobin 12.5  12.0 - 15.0 g/dL    HCT 04.5 (*) 40.9 - 46.0 %    MCV 84.6  78.0 - 100.0 fL    MCH 29.6  26.0 - 34.0 pg    MCHC 34.9  30.0 - 36.0 g/dL    RDW 81.1  91.4 - 78.2 %    Platelets 166  150 - 400 K/uL    Neutrophils Relative 93 (*) 43 - 77 %    Neutro Abs 7.9 (*) 1.7 - 7.7 K/uL    Lymphocytes Relative 4 (*) 12 - 46 %    Lymphs Abs 0.4 (*) 0.7 - 4.0 K/uL    Monocytes Relative 2 (*) 3 - 12 %  Monocytes Absolute 0.2  0.1 - 1.0 K/uL    Eosinophils Relative 0  0 - 5 %    Eosinophils Absolute 0.0  0.0 - 0.7 K/uL    Basophils Relative 0  0 - 1 %    Basophils Absolute 0.0  0.0 - 0.1 K/uL   COMPREHENSIVE METABOLIC PANEL     Status: Abnormal   Collection Time   05/20/12 12:01 AM      Component Value Range Comment   Sodium 131 (*) 135 - 145 mEq/L    Potassium 2.9 (*) 3.5 - 5.1 mEq/L    Chloride 95 (*) 96 - 112 mEq/L    CO2 23  19 - 32 mEq/L    Glucose, Bld 104 (*) 70 - 99 mg/dL    BUN 7  6 - 23 mg/dL    Creatinine, Ser 4.09  0.50 - 1.10 mg/dL    Calcium 7.9 (*) 8.4 - 10.5 mg/dL    Total Protein 6.0  6.0 - 8.3 g/dL    Albumin 2.9 (*) 3.5 - 5.2 g/dL    AST 19  0 - 37 U/L    ALT 12  0 - 35 U/L    Alkaline Phosphatase 67  39 - 117 U/L    Total Bilirubin 0.3  0.3 - 1.2 mg/dL    GFR calc non Af Amer 81 (*) >90 mL/min    GFR calc Af Amer >90  >90 mL/min   LIPASE, BLOOD     Status: Normal   Collection Time   05/20/12 12:01 AM      Component Value Range Comment   Lipase 37  11 - 59 U/L   LACTIC ACID, PLASMA     Status: Normal    Collection Time   05/20/12 12:33 AM      Component Value Range Comment   Lactic Acid, Venous 0.8  0.5 - 2.2 mmol/L    Dg Chest 2 View  05/20/2012  *RADIOLOGY REPORT*  Clinical Data: Cough and fever.  Chest pain.  CHEST - 2 VIEW  Comparison:  None.  Findings:  The heart size and mediastinal contours are within normal limits.  Both lungs are clear.  The visualized skeletal structures are unremarkable.  IMPRESSION: No active cardiopulmonary disease.   Original Report Authenticated By: Myles Rosenthal, M.D.     Review of Systems  Constitutional: Positive for fever and chills.  HENT: Negative.   Eyes: Negative.   Gastrointestinal: Positive for nausea and vomiting.  Genitourinary: Positive for flank pain.  Skin: Negative.   Neurological: Negative.   Endo/Heme/Allergies: Negative.   Psychiatric/Behavioral: Negative.     Blood pressure 92/49, pulse 92, temperature 99.7 F (37.6 C), temperature source Oral, resp. rate 16, last menstrual period 05/03/2012, SpO2 100.00%. Physical Exam  Constitutional: She is oriented to person, place, and time. She appears well-developed and well-nourished. No distress.  HENT:  Head: Normocephalic and atraumatic.  Right Ear: External ear normal.  Left Ear: External ear normal.  Nose: Nose normal.  Mouth/Throat: Oropharynx is clear and moist. No oropharyngeal exudate.  Eyes: Conjunctivae normal are normal. Pupils are equal, round, and reactive to light. Right eye exhibits no discharge. Left eye exhibits no discharge. No scleral icterus.  Neck: Normal range of motion. Neck supple.  Cardiovascular: Normal rate and regular rhythm.   Respiratory: Effort normal and breath sounds normal. No respiratory distress. She has no wheezes. She has no rales.  GI: Soft. Bowel sounds are normal. She exhibits no distension. There is no  tenderness. There is no rebound.  Musculoskeletal: She exhibits no edema and no tenderness.  Neurological: She is alert and oriented to person, place,  and time.  Skin: Skin is warm and dry. She is not diaphoretic.     Assessment/Plan #1. Fever with flulike symptoms - since patient has right flank pain and history of nephrolithiasis I have ordered a renal sonogram to make sure there is no hydronephrosis or perinephric stranding. At this time blood cultures and urine cultures and UA have been ordered and pending. Flu panel. For now I have placed patient on empiric ceftriaxone to cover for possible UTI and also Tamiflu until flu panel is available. Continue to hydrate. On exam patient abdomen appears benign. Continue to monitor clinically. #2. Hypokalemia - probably from nausea and vomiting. Patient is getting replacement through IV and orally. Recheck in a.m. #3. History of bronchitis - uses inhalers when necessary. #4. History of ADD.  CODE STATUS - full code.  Torie Towle N. 05/20/2012, 1:45 AM Addendum - patient's renal sonogram shows some abnormality and I have discussed the radiologist. At this time is not sure if patient has hydronephrosis or any obstructing stone. So a CT abdomen pelvis without contrast has been ordered.Will change antibiotics to Zosyn.  Midge Minium

## 2012-05-20 NOTE — Progress Notes (Signed)
[facility]                                                                                Patient Demographics  Cheryl Patterson, is a 42 y.o. female  ZOX:[id number]  [id number]  DOB - 1970-08-11  Admit date - 05/19/2012  Admitting Physician Eduard Clos, MD  Outpatient Primary MD for the patient is Loreen Freud, DO  LOS - 1   Chief Complaint  Patient presents with  . Generalized Body Aches  . Nausea  . Chills  . Fever        Assessment & Plan    1. R.Flank Pain- dysuria - SIRs - due to sepsis from pyelonephritis caused by right ureteric obstructing kidney stone, agree with IV fluids and IV Zosyn, cultures pending, have requested urology to see the patient. Might require IR guided nephrostomy tube placement will defer that to urology. For now n.p.o. except medications, stop Lovenox switch her to SCDs, monitor closely.   2. Questionable influenza. Likely generalized symptoms were due to #1 above, continue Tamiflu till influenza panel back and negative.    3. Nausea vomiting due to #1 above wasn't hypokalemia. Potassium replace by mouth and IV, will monitor closely.    Code Status: full  Family Communication: patient and family  Disposition Plan: home   Procedures CT Abd Pelvis, [place]   Consults  Urology, may need IR   DVT Prophylaxis    SCDs    Lab Results  Component Value Date   PLT 166 05/20/2012    Medications  Scheduled Meds:   . enoxaparin (LOVENOX) injection  40 mg Subcutaneous Q24H  . mometasone-formoterol  2 puff Inhalation BID  . oseltamivir  75 mg Oral BID  . pantoprazole  40 mg Oral Daily  . piperacillin-tazobactam (ZOSYN)  IV  3.375 g Intravenous Q8H  . sodium chloride  3 mL Intravenous Q12H   Continuous Infusions:   . 0.9 % NaCl with KCl 20 mEq / L 125 mL/hr at 05/20/12 0839   PRN Meds:.acetaminophen, acetaminophen, albuterol, influenza  inactive virus vaccine, ondansetron (ZOFRAN) IV,  ondansetron  Antibiotics     Anti-infectives     Start     Dose/Rate Route Frequency Ordered Stop   05/21/12 0300   cefTRIAXone (ROCEPHIN) 1 g in dextrose 5 % 50 mL IVPB  Status:  Discontinued        1 g 100 mL/hr over 30 Minutes Intravenous Every 24 hours 05/20/12 0416 05/20/12 0718   05/20/12 0900  piperacillin-tazobactam (ZOSYN) IVPB 3.375 g       3.375 g 12.5 mL/hr over 240 Minutes Intravenous 3 times per day 05/20/12 0737     05/20/12 0200   oseltamivir (TAMIFLU) capsule 75 mg        75 mg Oral 2 times daily 05/20/12 0142 05/24/12 2159   05/20/12 0145   cefTRIAXone (ROCEPHIN) 1 g in dextrose 5 % 50 mL IVPB  Status:  Discontinued        1 g 100 mL/hr over 30 Minutes Intravenous Every 24 hours 05/20/12 0142 05/20/12 0415           Time Spent in minutes   45  Leroy Sea M.D on 05/20/2012 at 8:58 AM  Between 7am to 7pm - Pager - [phone]  After 7pm go to [email or web address] - password [email or web address]  And look for the night coverage person covering for me after hours  [facility] Office  [phone]    Subjective:   Cheryl Patterson today has, No headache, No chest pain, No abdominal pain - No Nausea, No new weakness tingling or numbness, No Cough - SOB. R.sided flank pain and dysuria  Objective:   Filed Vitals:   05/20/12 0200 05/20/12 0306 05/20/12 0404 05/20/12 0630  BP: 92/68 85/41 92/38  90/40  Pulse: 98 96 52 89  Temp:  99.7 F (37.6 C) 100.6 F (38.1 C) 99.8 F (37.7 C)  TempSrc:  Oral Oral   Resp:  16 22   Height:   5\' 7"  (1.702 m)   Weight:   79 kg (174 lb 2.6 oz)   SpO2: 99% 99% 97%     Wt Readings from Last 3 Encounters:  05/20/12 79 kg (174 lb 2.6 oz)  11/16/11 78.109 kg (172 lb 3.2 oz)  10/06/11 79.833 kg (176 lb)     Intake/Output Summary (Last 24 hours) at 05/20/12 0858 Last data filed at 05/20/12 0700  Gross per 24 hour  Intake 654.17 ml  Output      0 ml  Net 654.17 ml    Exam Awake Alert, Oriented X 3, No new F.N  deficits, Normal affect [place].AT,PERRAL Supple Neck,No JVD, No cervical lymphadenopathy appriciated.  Symmetrical Chest wall movement, Good air movement bilaterally, CTAB, R sided flank tenderness RRR,No Gallops,Rubs or new Murmurs, No Parasternal Heave +ve B.Sounds, Abd Soft, Non tender, No organomegaly appriciated, No rebound - guarding or rigidity. No Cyanosis, Clubbing or edema, No new Rash or bruise     Data Review   Micro Results No results found for this or any previous visit (from the past 240 hour(s)).  Radiology Reports Ct Abdomen Pelvis Wo Contrast  05/20/2012  *RADIOLOGY REPORT*  Clinical Data: Right flank pain.  Abnormal ultrasound.  Kidney stone.  CT ABDOMEN AND PELVIS WITHOUT CONTRAST  Technique:  Multidetector CT imaging of the abdomen and pelvis was performed following the standard protocol without intravenous contrast.  Comparison: Ultrasound 05/20/2012.  Findings: Lung bases appear clear.  Liver, spleen, pancreas, adrenal glands and gallbladder appear within normal limits.  There is moderate right hydroureteronephrosis extending to the right ureter vesicle junction.  There is a 5 mm x 3 mm calculus at the right UVJ.  No residual collecting system calculi are present. The left kidney appears normal.  The small and large bowel normal. Physiologic appearance of the uterus and adnexa.  IMPRESSION: 5 mm x 3 mm right ureter vesicle junction calculus with moderate right hydroureteronephrosis.   Original Report Authenticated By: Andreas Newport, M.D.    Dg Chest 2 View  05/20/2012  *RADIOLOGY REPORT*  Clinical Data: Cough and fever.  Chest pain.  CHEST - 2 VIEW  Comparison:  None.  Findings:  The heart size and mediastinal contours are within normal limits.  Both lungs are clear.  The visualized skeletal structures are unremarkable.  IMPRESSION: No active cardiopulmonary disease.   Original Report Authenticated By: Myles Rosenthal, M.D.    US Renal  05/20/2012  *RADIOLOGY REPORT*  Clinical Data:  Right flank pain.  History of renal stones.  RENAL/URINARY TRACT ULTRASOUND COMPLETE  Comparison:  None.  Findings:  Right Kidney:  The right kidney measures 13.8 cm length.  Mild to moderate  pyelocaliectasis of the right kidney with dilatation of the visualized proximal right ureter.  The mid and distal ureter are not well demonstrated.  No solid mass or stone demonstrated.  Left Kidney:  The left kidney measures 12.1 cm length.  No hydronephrosis.  No focal mass lesion or stone demonstrated.  Bladder:  Bladder wall is not thickened.  Color Doppler images demonstrate urine flow jets bilaterally.  Prevoid volume is 3 meters 30 ml.  No postvoid images were obtained.  IMPRESSION: Mild to moderate right-sided pyelocaliectasis.   Original Report Authenticated By: Burman Nieves, M.D.     CBC  Lab 05/20/12 0001  WBC 8.4  HGB 12.5  HCT 35.8*  PLT 166  MCV 84.6  MCH 29.6  MCHC 34.9  RDW 12.6  LYMPHSABS 0.4*  MONOABS 0.2  EOSABS 0.0  BASOSABS 0.0  BANDABS --    Chemistries   Lab 05/20/12 0001  NA 131*  K 2.9*  CL 95*  CO2 23  GLUCOSE 104*  BUN 7  CREATININE 0.87  CALCIUM 7.9*  MG --  AST 19  ALT 12  ALKPHOS 67  BILITOT 0.3   ------------------------------------------------------------------------------------------------------------------ estimated creatinine clearance is 91.2 ml/min (by C-G formula based on Cr of 0.87). ------------------------------------------------------------------------------------------------------------------ No results found for this basename: HGBA1C:2 in the last 72 hours ------------------------------------------------------------------------------------------------------------------ No results found for this basename: CHOL:2,HDL:2,LDLCALC:2,TRIG:2,CHOLHDL:2,LDLDIRECT:2 in the last 72 hours ------------------------------------------------------------------------------------------------------------------ No results found for this basename:  TSH,T4TOTAL,FREET3,T3FREE,THYROIDAB in the last 72 hours ------------------------------------------------------------------------------------------------------------------ No results found for this basename: VITAMINB12:2,FOLATE:2,FERRITIN:2,TIBC:2,IRON:2,RETICCTPCT:2 in the last 72 hours  Coagulation profile No results found for this basename: INR:5,PROTIME:5 in the last 168 hours  No results found for this basename: DDIMER:2 in the last 72 hours  Cardiac Enzymes No results found for this basename: CK:3,CKMB:3,TROPONINI:3,MYOGLOBIN:3 in the last 168 hours ------------------------------------------------------------------------------------------------------------------ No components found with this basename: POCBNP:3

## 2012-05-20 NOTE — Progress Notes (Signed)
ANTIBIOTIC CONSULT NOTE - INITIAL  Pharmacy Consult for Zosyn Indication: Complicated UTI  Allergies  Allergen Reactions  . Codeine     Itchy throat, whelps on skin  . Other Swelling and Rash    tomatoes    Patient Measurements: Height: 5\' 7"  (170.2 cm) Weight: 174 lb 2.6 oz (79 kg) IBW/kg (Calculated) : 61.6   Vital Signs: Temp: 99.8 F (37.7 C) (02/01 0630) Temp src: Oral (02/01 0404) BP: 90/40 mmHg (02/01 0630) Pulse Rate: 89  (02/01 0630) Intake/Output from previous day: 01/31 0701 - 02/01 0700 In: 654.2 [I.V.:237.5; IV Piggyback:416.7] Out: -  Intake/Output from this shift:    Labs:  Basename 05/20/12 0001  WBC 8.4  HGB 12.5  PLT 166  LABCREA --  CREATININE 0.87   Estimated Creatinine Clearance: 91.2 ml/min (by C-G formula based on Cr of 0.87). No results found for this basename: VANCOTROUGH:2,VANCOPEAK:2,VANCORANDOM:2,GENTTROUGH:2,GENTPEAK:2,GENTRANDOM:2,TOBRATROUGH:2,TOBRAPEAK:2,TOBRARND:2,AMIKACINPEAK:2,AMIKACINTROU:2,AMIKACIN:2, in the last 72 hours   Microbiology: No results found for this or any previous visit (from the past 720 hour(s)).  Medical History: Past Medical History  Diagnosis Date  . Hyperlipidemia   . Chronic headaches   . Depression     [facility]osp [facility]---suicidal ideation  . Bipolar depression   . Allergy   . Anxiety   . Chronic kidney disease     kidney stones    Medications:  Anti-infectives     Start     Dose/Rate Route Frequency Ordered Stop   05/21/12 0300   cefTRIAXone (ROCEPHIN) 1 g in dextrose 5 % 50 mL IVPB  Status:  Discontinued        1 g 100 mL/hr over 30 Minutes Intravenous Every 24 hours 05/20/12 0416 05/20/12 0718   05/20/12 0200   oseltamivir (TAMIFLU) capsule 75 mg        75 mg Oral 2 times daily 05/20/12 0142 05/24/12 2159   05/20/12 0145   cefTRIAXone (ROCEPHIN) 1 g in dextrose 5 % 50 mL IVPB  Status:  Discontinued        1 g 100 mL/hr over 30 Minutes Intravenous Every 24 hours 05/20/12 0142  05/20/12 0415         Assessment: 42 yo F presents to the ER 1/31 pm with fever, chills, N/V. Patient has received ceftriaxone 1g IV in ER this am and has been started on Tamiflu for r/o influenza. Plan now is to change to Zosyn for r/o hydronephrosis vs obstructing stone (abd CT ordered by MD). CrCl>66ml/min.  Plan:  1) Zosyn 3.375g IV q8h   Darrol Angel, PharmD Pager: [phone] 05/20/2012,7:32 AM

## 2012-05-21 LAB — CBC
HCT: 33.4 % — ABNORMAL LOW (ref 36.0–46.0)
MCH: 29 pg (ref 26.0–34.0)
MCHC: 33.8 g/dL (ref 30.0–36.0)
MCV: 85.6 fL (ref 78.0–100.0)
Platelets: 154 10*3/uL (ref 150–400)
RDW: 12.6 % (ref 11.5–15.5)

## 2012-05-21 LAB — BASIC METABOLIC PANEL
BUN: 5 mg/dL — ABNORMAL LOW (ref 6–23)
CO2: 23 mEq/L (ref 19–32)
Calcium: 7.7 mg/dL — ABNORMAL LOW (ref 8.4–10.5)
Chloride: 106 mEq/L (ref 96–112)
Creatinine, Ser: 0.83 mg/dL (ref 0.50–1.10)

## 2012-05-21 NOTE — Progress Notes (Signed)
Patient complaining of moderate nausea and an increase in abdomen pain.  Gave anti-nausea medication.  Will continue to monitor.

## 2012-05-21 NOTE — Progress Notes (Signed)
Patient states she feels better after receiving zofran although she states she feels sedated

## 2012-05-21 NOTE — Progress Notes (Signed)
05-21-12  NSG:  Pt flu test came back negative.  Pt is receiving Tamiflu  ... Do you want to discontinue?.  Pt highest temp this shift was 101.3.  Given tylenol and tempt decreased within an hour to 100.3  Urine is clear, yellow and straining reveals very fine sediment.

## 2012-05-21 NOTE — ED Provider Notes (Signed)
Medical screening examination/treatment/procedure(s) were performed by non-physician practitioner and as supervising physician I was immediately available for consultation/collaboration.   Laray Anger, DO 05/21/12 [phone]

## 2012-05-21 NOTE — Progress Notes (Signed)
[facility]                                                                                Patient Demographics  Cheryl Patterson, is a 42 y.o. female  WUJ:[id number]  NFA:[id number]  DOB - 22-Mar-1971  Admit date - 05/19/2012  Admitting Physician Eduard Clos, MD  Outpatient Primary MD for the patient is Loreen Freud, DO  LOS - 2   Chief Complaint  Patient presents with  . Generalized Body Aches  . Nausea  . Chills  . Fever        Assessment & Plan    1. R.Flank Pain- dysuria - SIRs - due to sepsis from pyelonephritis caused by right ureteric obstructing kidney stone, agree with IV fluids and IV Zosyn, cultures pending, neurology following the patient with IV fluids and conservative management for now, might require IR guided nephrostomy tube placement will defer that to urology, monitor closely.   2. Questionable influenza. Likely generalized symptoms were due to #1 above, stop Tamiflu as influenza panel negative.    3. Nausea vomiting due to #1 above wasn't hypokalemia. Potassium replace by mouth and IV, and stable will monitor. Symptoms are much improved supportive care.    Code Status: full  Family Communication: patient and family  Disposition Plan: home   Procedures CT Abd Pelvis, [place]   Consults  Urology, may need IR   DVT Prophylaxis    SCDs    Lab Results  Component Value Date   PLT 154 05/21/2012    Medications  Scheduled Meds:    . mometasone-formoterol  2 puff Inhalation BID  . pantoprazole  40 mg Oral Daily  . piperacillin-tazobactam (ZOSYN)  IV  3.375 g Intravenous Q8H  . sodium chloride  3 mL Intravenous Q12H  . Tamsulosin HCl  0.4 mg Oral Daily   Continuous Infusions:    . 0.9 % NaCl with KCl 20 mEq / L 125 mL/hr at 05/21/12 1000   PRN Meds:.acetaminophen, acetaminophen, albuterol, influenza  inactive virus vaccine, ketorolac, ondansetron (ZOFRAN) IV, ondansetron  Antibiotics     Anti-infectives      Start     Dose/Rate Route Frequency Ordered Stop   05/21/12 0300   cefTRIAXone (ROCEPHIN) 1 g in dextrose 5 % 50 mL IVPB  Status:  Discontinued        1 g 100 mL/hr over 30 Minutes Intravenous Every 24 hours 05/20/12 0416 05/20/12 0718   05/20/12 0900  piperacillin-tazobactam (ZOSYN) IVPB 3.375 g       3.375 g 12.5 mL/hr over 240 Minutes Intravenous 3 times per day 05/20/12 0737     05/20/12 0200   oseltamivir (TAMIFLU) capsule 75 mg  Status:  Discontinued        75 mg Oral 2 times daily 05/20/12 0142 05/21/12 0951   05/20/12 0145   cefTRIAXone (ROCEPHIN) 1 g in dextrose 5 % 50 mL IVPB  Status:  Discontinued        1 g 100 mL/hr over 30 Minutes Intravenous Every 24 hours 05/20/12 0142 05/20/12 0415           Time Spent in minutes   45   Harryette Shuart  K M.D on 05/21/2012 at 10:23 AM  Between 7am to 7pm - Pager - [phone]  After 7pm go to [email or web address] - password [email or web address]  And look for the night coverage person covering for me after hours  [facility] Office  [phone]    Subjective:   Cheryl Patterson today has, No headache, No chest pain, No abdominal pain - No Nausea, No new weakness tingling or numbness, No Cough - SOB. R.sided flank pain and dysuria  Objective:   Filed Vitals:   05/20/12 2156 05/21/12 0513 05/21/12 0849 05/21/12 0946  BP: 100/39 92/39 108/39   Pulse: 86 73 70   Temp: 100.3 F (37.9 C) 98.6 F (37 C) 98.6 F (37 C)   TempSrc: Oral Oral Oral   Resp: 16 16 22    Height:      Weight:      SpO2: 100% 100% 100% 98%    Wt Readings from Last 3 Encounters:  05/20/12 79 kg (174 lb 2.6 oz)  11/16/11 78.109 kg (172 lb 3.2 oz)  10/06/11 79.833 kg (176 lb)     Intake/Output Summary (Last 24 hours) at 05/21/12 1023 Last data filed at 05/21/12 0508  Gross per 24 hour  Intake   4830 ml  Output   3200 ml  Net   1630 ml    Exam Awake Alert, Oriented X 3, No new F.N deficits, Normal affect [place].AT,PERRAL Supple Neck,No JVD, No  cervical lymphadenopathy appriciated.  Symmetrical Chest wall movement, Good air movement bilaterally, CTAB, R sided flank tenderness RRR,No Gallops,Rubs or new Murmurs, No Parasternal Heave +ve B.Sounds, Abd Soft, Non tender, No organomegaly appriciated, No rebound - guarding or rigidity. No Cyanosis, Clubbing or edema, No new Rash or bruise     Data Review   Micro Results No results found for this or any previous visit (from the past 240 hour(s)).  Radiology Reports Ct Abdomen Pelvis Wo Contrast  05/20/2012  *RADIOLOGY REPORT*  Clinical Data: Right flank pain.  Abnormal ultrasound.  Kidney stone.  CT ABDOMEN AND PELVIS WITHOUT CONTRAST  Technique:  Multidetector CT imaging of the abdomen and pelvis was performed following the standard protocol without intravenous contrast.  Comparison: Ultrasound 05/20/2012.  Findings: Lung bases appear clear.  Liver, spleen, pancreas, adrenal glands and gallbladder appear within normal limits.  There is moderate right hydroureteronephrosis extending to the right ureter vesicle junction.  There is a 5 mm x 3 mm calculus at the right UVJ.  No residual collecting system calculi are present. The left kidney appears normal.  The small and large bowel normal. Physiologic appearance of the uterus and adnexa.  IMPRESSION: 5 mm x 3 mm right ureter vesicle junction calculus with moderate right hydroureteronephrosis.   Original Report Authenticated By: Andreas Newport, M.D.    Dg Chest 2 View  05/20/2012  *RADIOLOGY REPORT*  Clinical Data: Cough and fever.  Chest pain.  CHEST - 2 VIEW  Comparison:  None.  Findings:  The heart size and mediastinal contours are within normal limits.  Both lungs are clear.  The visualized skeletal structures are unremarkable.  IMPRESSION: No active cardiopulmonary disease.   Original Report Authenticated By: Myles Rosenthal, M.D.    US Renal  05/20/2012  *RADIOLOGY REPORT*  Clinical Data: Right flank pain.  History of renal stones.  RENAL/URINARY  TRACT ULTRASOUND COMPLETE  Comparison:  None.  Findings:  Right Kidney:  The right kidney measures 13.8 cm length.  Mild to moderate pyelocaliectasis of the right kidney with dilatation of  the visualized proximal right ureter.  The mid and distal ureter are not well demonstrated.  No solid mass or stone demonstrated.  Left Kidney:  The left kidney measures 12.1 cm length.  No hydronephrosis.  No focal mass lesion or stone demonstrated.  Bladder:  Bladder wall is not thickened.  Color Doppler images demonstrate urine flow jets bilaterally.  Prevoid volume is 3 meters 30 ml.  No postvoid images were obtained.  IMPRESSION: Mild to moderate right-sided pyelocaliectasis.   Original Report Authenticated By: Burman Nieves, M.D.     CBC  Lab 05/21/12 0506 05/20/12 0001  WBC 4.1 8.4  HGB 11.3* 12.5  HCT 33.4* 35.8*  PLT 154 166  MCV 85.6 84.6  MCH 29.0 29.6  MCHC 33.8 34.9  RDW 12.6 12.6  LYMPHSABS -- 0.4*  MONOABS -- 0.2  EOSABS -- 0.0  BASOSABS -- 0.0  BANDABS -- --    Chemistries   Lab 05/21/12 0506 05/20/12 1026 05/20/12 0001  NA 137 -- 131*  K 3.9 3.5 2.9*  CL 106 -- 95*  CO2 23 -- 23  GLUCOSE 97 -- 104*  BUN 5* -- 7  CREATININE 0.83 -- 0.87  CALCIUM 7.7* -- 7.9*  MG -- -- --  AST -- -- 19  ALT -- -- 12  ALKPHOS -- -- 67  BILITOT -- -- 0.3   ------------------------------------------------------------------------------------------------------------------ estimated creatinine clearance is 95.6 ml/min (by C-G formula based on Cr of 0.83). ------------------------------------------------------------------------------------------------------------------ No results found for this basename: HGBA1C:2 in the last 72 hours ------------------------------------------------------------------------------------------------------------------ No results found for this basename: CHOL:2,HDL:2,LDLCALC:2,TRIG:2,CHOLHDL:2,LDLDIRECT:2 in the last 72  hours ------------------------------------------------------------------------------------------------------------------ No results found for this basename: TSH,T4TOTAL,FREET3,T3FREE,THYROIDAB in the last 72 hours ------------------------------------------------------------------------------------------------------------------ No results found for this basename: VITAMINB12:2,FOLATE:2,FERRITIN:2,TIBC:2,IRON:2,RETICCTPCT:2 in the last 72 hours  Coagulation profile No results found for this basename: INR:5,PROTIME:5 in the last 168 hours  No results found for this basename: DDIMER:2 in the last 72 hours  Cardiac Enzymes No results found for this basename: CK:3,CKMB:3,TROPONINI:3,MYOGLOBIN:3 in the last 168 hours ------------------------------------------------------------------------------------------------------------------ No components found with this basename: POCBNP:3

## 2012-05-21 NOTE — Progress Notes (Signed)
Urology Progress Note  * No surgery found *   Subjective: 5mm x 3mm R u-v junction stone on flomax and IV toradol 30mg  IV q 6h. Passed granules overnight ( not captured). Pain improved.    No acute urologic events overnight. Ambulation:   positive Flatus:    positive Bowel movement  positive  Pain: some relief  Objective:  Blood pressure 108/39, pulse 70, temperature 98.6 F (37 C), temperature source Oral, resp. rate 22, height 5\' 7"  (1.702 m), weight 79 kg (174 lb 2.6 oz), last menstrual period 05/03/2012, SpO2 98.00%.  Physical Exam:  General:  No acute distress, awake flank and abd benign Genitourinary:   wnl Foley: none    I/O last 3 completed shifts: In: 5484.2 [P.O.:1880; I.V.:2987.5; IV Piggyback:616.7] Out: 3200 [Urine:3200]  Recent Labs  [facility] 05/21/12 0506 05/20/12 0001   HGB 11.3* 12.5   WBC 4.1 8.4   PLT 154 166    Recent Labs  Basename 05/21/12 0506 05/20/12 1026 05/20/12 0001   NA 137 -- 131*   K 3.9 3.5 --   CL 106 -- 95*   CO2 23 -- 23   BUN 5* -- 7   CREATININE 0.83 -- 0.87   CALCIUM 7.7* -- 7.9*   GFRNONAA 86* -- 81*   GFRAA >90 -- >90     No results found for this basename: PT:2,INR:2,APTT:2 in the last 72 hours   No components found with this basename: ABG:2  Assessment/Plan:  Continue any current medications. Strain urine. Continue Flomax.  Return to [facility] for follow-up, [phone]

## 2012-05-22 DIAGNOSIS — N201 Calculus of ureter: Secondary | ICD-10-CM

## 2012-05-22 DIAGNOSIS — E663 Overweight: Secondary | ICD-10-CM

## 2012-05-22 DIAGNOSIS — N390 Urinary tract infection, site not specified: Secondary | ICD-10-CM

## 2012-05-22 LAB — CBC
HCT: 31.6 % — ABNORMAL LOW (ref 36.0–46.0)
MCHC: 34.5 g/dL (ref 30.0–36.0)
Platelets: 167 10*3/uL (ref 150–400)
RDW: 12.7 % (ref 11.5–15.5)

## 2012-05-22 LAB — URINE CULTURE

## 2012-05-22 LAB — BASIC METABOLIC PANEL
BUN: 5 mg/dL — ABNORMAL LOW (ref 6–23)
Calcium: 8.2 mg/dL — ABNORMAL LOW (ref 8.4–10.5)
Creatinine, Ser: 0.89 mg/dL (ref 0.50–1.10)
GFR calc Af Amer: >90 mL/min (ref >90)
GFR calc non Af Amer: 79 mL/min — ABNORMAL LOW (ref >90)

## 2012-05-22 MED ORDER — CIPROFLOXACIN HCL 500 MG PO TABS: 500.0000 mg | ORAL_TABLET | Freq: Two times a day (BID) | ORAL | Status: DC

## 2012-05-22 NOTE — Progress Notes (Signed)
05-22-12  NSG:  No stones noted in urine this shift.  Just continues to have very fine scant amount of sediment.  No temps or flank pain this shift.  Just a headache earlier which was eased with tylenol

## 2012-05-22 NOTE — Discharge Summary (Signed)
[facility]                                                                                   Cheryl Patterson, is a 42 y.o. female  DOB 25-Oct-1970  MRN [id number].  Admission date:  05/19/2012  Discharge Date:  05/22/2012  Primary MD  Cheryl Freud, DO  Admitting Physician  Cheryl Clos, MD  Admission Diagnosis  Hypokalemia [276.8] GERD (gastroesophageal reflux disease) [530.81] Tachycardia [785.0] Fever [780.60] Flu-like symptoms [487.1] Nausea and vomiting [787.01] Hypotension [458.9] FLU SYMPTOMS  Discharge Diagnosis     Principal Problem:  *Sepsis secondary to UTI Active Problems:  Fever  Nausea and vomiting  Ureteric stone     Past Medical History  Diagnosis Date  . Hyperlipidemia   . Chronic headaches   . Depression     Hosp 1993 [facility]---suicidal ideation  . Bipolar depression   . Allergy   . Anxiety   . Chronic kidney disease     kidney stones    Past Surgical History  Procedure Date  . Tubal ligation   . Leep 2005  . Breast surgery     BREAST LUMP REMOVAL ??WHICH BREAST  . Other surgical history     RENAL STONE REMOVAL  . Mouth surgery   . Lipoma excision   . Kidney stones      Recommendations for primary care physician for things to follow:      Discharge Diagnoses:   Principal Problem:  *Sepsis secondary to UTI Active Problems:  Fever  Nausea and vomiting  Ureteric stone    Discharge Condition: stable   Diet recommendation: See Discharge Instructions below   Consults Urology   History of present illness and  Hospital Course:  See H&P, Labs, Consult and Test reports for all details in brief, patient was admitted for R.sided Uretric stone with urinary tract infection and sepsis, requiring IV fluids, IV antibiotics, urine cultures growing Enterobacter sensitive to Cipro, was seen by urologist Cheryl Patterson saw the patient in consultation, I discussed her case with Cheryl Patterson personally today,  per Cheryl Patterson patient has likely passed stone her symptoms are much improved, she remains afebrile, Cheryl Patterson suggested 5 more days of oral Cipro antibiotic, with close followup with PCP and him in the office    Patient is stable at this time, IV fluids will be stopped we will admit her in the hallway if she tolerates activity well will be discharged home.    Today   Subjective:   Cheryl Patterson today has no headache,no chest abdominal pain,no new weakness tingling or numbness, feels much better wants to go home today.    Objective:   Blood pressure 101/64, pulse 62, temperature 98.4 F (36.9 C), temperature source Oral, resp. rate 16, height 5\' 7"  (1.702 m), weight 79 kg (174 lb 2.6 oz), last menstrual period 05/03/2012, SpO2 98.00%.   Intake/Output Summary (Last 24 hours) at 05/22/12 1028 Last data filed at 05/22/12 0600  Gross per 24 hour  Intake 4855.33 ml  Output   3680 ml  Net 1175.33 ml    Exam Awake Alert, Oriented *3, No new F.N deficits, Normal  affect [place].AT,PERRAL Supple Neck,No JVD, No cervical lymphadenopathy appriciated.  Symmetrical Chest wall movement, Good air movement bilaterally, CTAB RRR,No Gallops,Rubs or new Murmurs, No Parasternal Heave +ve B.Sounds, Abd Soft, Non tender, No organomegaly appriciated, No rebound -guarding or rigidity. No Cyanosis, Clubbing or edema, No new Rash or bruise  Data Review   Major procedures and Radiology Reports - PLEASE review detailed and final reports for all details in brief -       Ct Abdomen Pelvis Wo Contrast  05/20/2012  *RADIOLOGY REPORT*  Clinical Data: Right flank pain.  Abnormal ultrasound.  Kidney stone.  CT ABDOMEN AND PELVIS WITHOUT CONTRAST  Technique:  Multidetector CT imaging of the abdomen and pelvis was performed following the standard protocol without intravenous contrast.  Comparison: Ultrasound 05/20/2012.  Findings: Lung bases appear clear.  Liver, spleen, pancreas, adrenal glands and gallbladder  appear within normal limits.  There is moderate right hydroureteronephrosis extending to the right ureter vesicle junction.  There is a 5 mm x 3 mm calculus at the right UVJ.  No residual collecting system calculi are present. The left kidney appears normal.  The small and large bowel normal. Physiologic appearance of the uterus and adnexa.  IMPRESSION: 5 mm x 3 mm right ureter vesicle junction calculus with moderate right hydroureteronephrosis.   Original Report Authenticated By: Cheryl Patterson, M.D.    Dg Chest 2 View  05/20/2012  *RADIOLOGY REPORT*  Clinical Data: Cough and fever.  Chest pain.  CHEST - 2 VIEW  Comparison:  None.  Findings:  The heart size and mediastinal contours are within normal limits.  Both lungs are clear.  The visualized skeletal structures are unremarkable.  IMPRESSION: No active cardiopulmonary disease.   Original Report Authenticated By: Cheryl Patterson, M.D.    US Renal  05/20/2012  *RADIOLOGY REPORT*  Clinical Data: Right flank pain.  History of renal stones.  RENAL/URINARY TRACT ULTRASOUND COMPLETE  Comparison:  None.  Findings:  Right Kidney:  The right kidney measures 13.8 cm length.  Mild to moderate pyelocaliectasis of the right kidney with dilatation of the visualized proximal right ureter.  The mid and distal ureter are not well demonstrated.  No solid mass or stone demonstrated.  Left Kidney:  The left kidney measures 12.1 cm length.  No hydronephrosis.  No focal mass lesion or stone demonstrated.  Bladder:  Bladder wall is not thickened.  Color Doppler images demonstrate urine flow jets bilaterally.  Prevoid volume is 3 meters 30 ml.  No postvoid images were obtained.  IMPRESSION: Mild to moderate right-sided pyelocaliectasis.   Original Report Authenticated By: Cheryl Patterson, M.D.     Micro Results      Recent Results (from the past 240 hour(s))  CULTURE, BLOOD (ROUTINE X 2)     Status: Normal (Preliminary result)   Collection Time   05/20/12  1:05 AM      Component  Value Range Status Comment   Specimen Description BLOOD RIGHT HAND   Final    Special Requests BOTTLES DRAWN AEROBIC AND ANAEROBIC 5CC   Final    Culture  Setup Time 05/20/2012 16:20   Final    Culture     Final    Value:        BLOOD CULTURE RECEIVED NO GROWTH TO DATE CULTURE WILL BE HELD FOR 5 DAYS BEFORE ISSUING A FINAL NEGATIVE REPORT   Report Status PENDING   Incomplete   CULTURE, BLOOD (ROUTINE X 2)     Status: Normal (Preliminary result)  Collection Time   05/20/12  1:10 AM      Component Value Range Status Comment   Specimen Description BLOOD RIGHT ARM   Final    Special Requests BOTTLES DRAWN AEROBIC AND ANAEROBIC 4CC   Final    Culture  Setup Time 05/20/2012 16:20   Final    Culture     Final    Value:        BLOOD CULTURE RECEIVED NO GROWTH TO DATE CULTURE WILL BE HELD FOR 5 DAYS BEFORE ISSUING A FINAL NEGATIVE REPORT   Report Status PENDING   Incomplete   URINE CULTURE     Status: Normal   Collection Time   05/20/12  1:37 AM      Component Value Range Status Comment   Specimen Description URINE, RANDOM   Final    Special Requests NONE   Final    Culture  Setup Time 05/20/2012 17:32   Final    Colony Count >=100,000 COLONIES/ML   Final    Culture ENTEROBACTER AEROGENES   Final    Report Status 05/22/2012 FINAL   Final    Organism ID, Bacteria ENTEROBACTER AEROGENES   Final      CBC w Diff: Lab Results  Component Value Date   WBC 5.4 05/22/2012   HGB 10.9* 05/22/2012   HCT 31.6* 05/22/2012   PLT 167 05/22/2012   LYMPHOPCT 4* 05/20/2012   MONOPCT 2* 05/20/2012   EOSPCT 0 05/20/2012   BASOPCT 0 05/20/2012    CMP: Lab Results  Component Value Date   NA 135 05/22/2012   K 3.7 05/22/2012   CL 102 05/22/2012   CO2 25 05/22/2012   BUN 5* 05/22/2012   CREATININE 0.89 05/22/2012   PROT 6.0 05/20/2012   ALBUMIN 2.9* 05/20/2012   BILITOT 0.3 05/20/2012   ALKPHOS 67 05/20/2012   AST 19 05/20/2012   ALT 12 05/20/2012  .   Discharge Instructions     Follow with Primary MD Cheryl Freud, DO in 3 days    Get CBC, CMP, checked 3 days by Primary MD and again as instructed by your Primary MD. Please follow final culture results with your primary care physician at that visit.  Get Medicines reviewed and adjusted.  Please request your Prim.MD to go over all Hospital Tests and Procedure/Radiological results at the follow up, please get all Hospital records sent to your Prim MD by signing hospital release before you go home.  Activity: As tolerated with Full fall precautions use walker/cane & assistance as needed   Diet:  Heart healthy diet drink plenty of fluids and strain your urine   Disposition Home    If you experience worsening of your admission symptoms, develop shortness of breath, life threatening emergency, suicidal or homicidal thoughts you must seek medical attention immediately by calling 911 or calling your MD immediately  if symptoms less severe.  You Must read complete instructions/literature along with all the possible adverse reactions/side effects for all the Medicines you take and that have been prescribed to you. Take any new Medicines after you have completely understood and accpet all the possible adverse reactions/side effects.   Do not drive and provide baby sitting services if your were admitted for syncope or siezures until you have seen by Primary MD or a Neurologist and advised to do so again.  Do not drive when taking Pain medications.    Do not take more than prescribed Pain, Sleep and Anxiety Medications  Special Instructions: If you  have smoked or chewed Tobacco  in the last 2 yrs please stop smoking, stop any regular Alcohol  and or any Recreational drug use.  Wear Seat belts while driving.   Follow-up Information    Follow up with Cheryl Freud, DO. Schedule an appointment as soon as possible for a visit in 3 days.   Contact information:   [address] [facility] [address] [place] [address] [phone]       Follow up with  Jethro Bolus I, MD. Schedule an appointment as soon as possible for a visit in 1 week.   Contact information:   [address] [place] [address] [phone]            Discharge Medications     Medication List     As of 05/22/2012 10:28 AM    START taking these medications         ciprofloxacin 500 MG tablet   Commonly known as: CIPRO   Take 1 tablet (500 mg total) by mouth 2 (two) times daily.      CONTINUE taking these medications         amphetamine-dextroamphetamine 30 MG 24 hr capsule   Commonly known as: ADDERALL XR   Take 1 capsule (30 mg total) by mouth every morning.      Fluticasone-Salmeterol 250-50 MCG/DOSE Aepb   Commonly known as: ADVAIR      ASK your doctor about these medications         omeprazole 40 MG capsule   Commonly known as: PRILOSEC   Take 1 capsule (40 mg total) by mouth daily.          Where to get your medications    These are the prescriptions that you need to pick up. We sent them to a specific pharmacy, so you will need to go there to get them.   [facility] [address] [address], [place] - [address] AT [facility] [address]    [address] [place] [address]    Phone: [phone]    Hours: 24-hours        ciprofloxacin 500 MG tablet               Total Time in preparing paper work, data evaluation and todays exam - 35 minutes  Leroy Sea M.D on 05/22/2012 at 10:28 AM  [facility] Office  [phone]

## 2012-05-23 ENCOUNTER — Ambulatory Visit (INDEPENDENT_AMBULATORY_CARE_PROVIDER_SITE_OTHER): Payer: Managed Care, Other (non HMO) | Admitting: Family Medicine

## 2012-05-23 ENCOUNTER — Encounter: Payer: Self-pay | Admitting: Family Medicine

## 2012-05-23 VITALS — BP 100/68 | HR 81 | Temp 97.8°F | Wt 172.6 lb

## 2012-05-23 DIAGNOSIS — R05 Cough: Secondary | ICD-10-CM

## 2012-05-23 DIAGNOSIS — E876 Hypokalemia: Secondary | ICD-10-CM | POA: Insufficient documentation

## 2012-05-23 DIAGNOSIS — N390 Urinary tract infection, site not specified: Secondary | ICD-10-CM

## 2012-05-23 DIAGNOSIS — A419 Sepsis, unspecified organism: Secondary | ICD-10-CM

## 2012-05-23 DIAGNOSIS — R059 Cough, unspecified: Secondary | ICD-10-CM

## 2012-05-23 NOTE — Progress Notes (Signed)
  Subjective:    Patient ID: Cheryl Patterson, female    DOB: [birth year], 42 y.o.   MRN: [id number]  HPI Pt here f/u hospital for urosepsis, and hypokalemia.  Pt is on cipro. Urine culture sensitivities are back    Review of Systems as above   Objective:   Physical Exam  Constitutional: She is oriented to person, place, and time. She appears well-developed and well-nourished.  Abdominal: Soft. She exhibits no distension. There is no tenderness.  Neurological: She is alert and oriented to person, place, and time.    BP 100/68  Pulse 81  Temp 97.8 F (36.6 C) (Oral)  Wt 172 lb 9.6 oz (78.291 kg)  SpO2 99%  LMP 05/03/2012 General appearance: alert, cooperative, appears stated age and no distress Lungs: clear to auscultation bilaterally Heart: S1, S2 normal      Assessment & Plan:

## 2012-05-23 NOTE — Patient Instructions (Addendum)
Urosepsis Urosepsis is a severe illness that occurs when an infection starts in the urinary tract and spreads into the bloodstream. Urosepsis can be life-threatening if it is not treated immediately. CAUSES  A urinary tract infection typically begins after bacteria get into the bladder. The bacteria get there through the opening in the bottom of the bladder (urethra). An infection in only the bladder is called cystitis. If the infection spreads to the kidneys, it is called pyelonephritis. Pyelonephritis is a more severe illness than cystitis. Urosepsis is a complication that occurs in some people who have pyelonephritis. It is not known why a urinary tract infection in some people spreads into the bloodstream. Factors that increase your risk for developing urosepsis include:  Advanced age.  Diabetes.  Weakened immune system, such as from chemotherapy.  Kidney stones.  Having a tube used to drain urine from the bladder (Foley catheter).  Having a recent bladder test (cystoscopy). SYMPTOMS  Symptoms of urosepsis can include:  Fever or low body temperature (hypothermia).  Rapid breathing (hyperventilation).  Chills.  Rapid heartbeat (tachycardia). You may also have typical symptoms of a urinary tract infection, such as:  Pain with urination.  Difficulty urinating.  Frequent urination.  Pain in your side. When sepsis is severe, low blood pressure and decreased oxygen flow to your vital organs may also occur. This is called shock. DIAGNOSIS  Your caregiver may suspect urosepsis based on your history and a physical exam. Urine and blood tests may be done. Other tests, such as X-rays, ultrasound exams, or a CT scan may be used to determine the severity of your condition.  TREATMENT  Treatment requires a stay in the hospital. If you have severe sepsis, care is provided in an intensive care unit (ICU). Urosepsis requires prompt treatment with antibiotic medicines. Intravenous (IV)  fluids will be used to support your blood pressure. Sometimes, other strong medicines are used to raise your blood pressure if necessary. Medicines can also be used to control pain or nausea, if present.  HOME CARE INSTRUCTIONS   Take your antibiotics as directed. Finish them even if you start to feel better.  Drink enough fluids to keep your urine clear or pale yellow. Cranberry juice is especially recommended, in addition to water.  Increase your activity as tolerated, but get plenty of rest.  Keep all follow-up appointments with your caregiver for checkups and any tests.  Avoid caffeine, tea, and carbonated beverages. They can irritate the bladder.  Avoid alcohol. It may irritate the prostate, if this applies.  Only take over-the-counter or prescription medicines for pain, discomfort, or fever as directed by your caregiver. PREVENTION  Empty your bladder often. Avoid holding urine for long periods of time.  After a bowel movement, women should wipe from front to back. Use each tissue only once.  Empty your bladder before and after sexual intercourse. SEEK MEDICAL CARE IF:   You develop back pain.  You develop nausea or vomiting.  Your symptoms are not better after 3 days. SEEK IMMEDIATE MEDICAL CARE IF:   You feel lightheaded or develop shortness of breath.  You develop chills or a fever.  You are getting worse, not better. MAKE SURE YOU:  Understand these instructions.  Will watch your condition.  Will get help right away if you are not doing well or get worse. Document Released: 04/05/2005 Document Revised: 06/28/2011 Document Reviewed: 10/29/2010 [facility] Patient Information 2013 [place], [place].

## 2012-05-23 NOTE — Assessment & Plan Note (Addendum)
Check ua and labs Finish cipro  rto prn

## 2012-05-23 NOTE — Assessment & Plan Note (Signed)
Check bmp 

## 2012-05-24 ENCOUNTER — Telehealth: Payer: Self-pay | Admitting: *Deleted

## 2012-05-24 LAB — BASIC METABOLIC PANEL
BUN: 6 mg/dL (ref 6–23)
GFR: 96.91 mL/min (ref >60.00)
Potassium: 3.7 mEq/L (ref 3.5–5.1)
Sodium: 137 mEq/L (ref 135–145)

## 2012-05-24 LAB — CBC WITH DIFFERENTIAL/PLATELET
Eosinophils Relative: 1.3 % (ref 0.0–5.0)
HCT: 37.2 % (ref 36.0–46.0)
Lymphs Abs: 1.3 10*3/uL (ref 0.7–4.0)
Monocytes Relative: 4.9 % (ref 3.0–12.0)
Platelets: 243 10*3/uL (ref 150.0–400.0)
RBC: 4.34 Mil/uL (ref 3.87–5.11)
WBC: 4.3 10*3/uL — ABNORMAL LOW (ref 4.5–10.5)

## 2012-05-24 LAB — HEPATIC FUNCTION PANEL
ALT: 56 U/L — ABNORMAL HIGH (ref 0–35)
AST: 42 U/L — ABNORMAL HIGH (ref 0–37)
Bilirubin, Direct: 0.1 mg/dL (ref 0.0–0.3)
Total Bilirubin: 0.5 mg/dL (ref 0.3–1.2)

## 2012-05-24 LAB — POCT URINALYSIS DIPSTICK
Ketones, UA: NEGATIVE
Protein, UA: NEGATIVE
Spec Grav, UA: 1.01
pH, UA: 7.5

## 2012-05-24 MED ORDER — FLUCONAZOLE 150 MG PO TABS: 150.0000 mg | ORAL_TABLET | Freq: Once | ORAL | Status: DC

## 2012-05-24 NOTE — Telephone Encounter (Signed)
Pt requesting a note to return to work on  Monday instead of tomorrow. Pt also states that she has developed a yeast infection due to the antibiotic and would like to have a med Rx.Please advise

## 2012-05-24 NOTE — Telephone Encounter (Signed)
Tried to call Pt back number on file do not work, Left Message on Pt Emergency contact phone to have Pt return call

## 2012-05-24 NOTE — Telephone Encounter (Signed)
Ok to extend work note and ok for Diflucan 150mg  x1 dose

## 2012-05-25 NOTE — Telephone Encounter (Signed)
Caller: Cheryl Patterson/Patient; Phone: [phone]; Reason for Call: Patient states that someone called her mom.  She is wondering if this is concerning a (1) "return to work letter".  (2) She has already called about receiving a "yeast infection pill" yesterday .  She is requesting we look in her record and advise what the call back was for? (1)  Patient would like to update phone number-  [phone]  PLEASE UPDATE THIS TO HER RECORDS (2)  Reviewed Epic and advised Difulcan called to her pharmacy 05/24/12.  Understanding expressed (3)  Patient will pick up work note tomorrow from the office-  Advised that the work note was extended.  SHE WILL STOP BY OFFICE TOMORROW 05/26/12

## 2012-05-25 NOTE — Telephone Encounter (Signed)
noted 

## 2012-05-26 LAB — CULTURE, BLOOD (ROUTINE X 2): Culture: NO GROWTH

## 2012-06-08 ENCOUNTER — Telehealth: Payer: Self-pay | Admitting: Family Medicine

## 2012-06-08 NOTE — Telephone Encounter (Signed)
spoke with patient and she stated she dd not feel like she needed  to go to the hospital. She said she is having some chest tightness and sob but she is not that sick. She thought she was getting another UTI. She wanted to discuss her symptoms with Dr.Lowne next week when she came in and agreed to go to the ED if her symptoms get worst.      KP

## 2012-06-08 NOTE — Telephone Encounter (Signed)
She needs to be seen..  Is she can not get here to see someone--- she should go to ER

## 2012-06-08 NOTE — Telephone Encounter (Signed)
Patient Information:  Caller Name: Aneisha  Phone: [phone]  Patient: Cheryl Patterson, Cheryl Patterson  Gender: Female  DOB: 04-Oct-1970  Age: 42 Years  PCP: Marga Melnick  Pregnant: No  Office Follow Up:  Does the office need to follow up with this patient?: Yes  Instructions For The Office: Please review symptoms -- urgent -- Would you like to see in office vs going to ER ? Call patient at  [phone].   Symptoms  Reason For Call & Symptoms: SOB with chest tightness started 2/9, headache at first.  Has been using Advair which has helped some.  Feeling lightheaded at intervals.    UTI Sx with frequency, urgency, has pelvic pressure intermittently started yesterday 2/19 and has had side and lower back discomfort since 2/17  Reviewed Health History In EMR: Yes  Reviewed Medications In EMR: Yes  Reviewed Allergies In EMR: Yes  Reviewed Surgeries / Procedures: Yes  Date of Onset of Symptoms: 05/28/2012  Treatments Tried: push fluids  helps some Advair more often  Treatments Tried Worked: Yes OB / GYN:  LMP: 06/05/2012  Guideline(s) Used:  Urination Pain - Female  Breathing Difficulty  Disposition Per Guideline:   Go to ED Now  Reason For Disposition Reached:   Recent illness requiring prolonged bedrest (i.e., immobilization)  Advice Given:  Fluids:   Drink extra fluids. Drink 8-10 glasses of liquids a day (Reason: to produce a dilute, non-irritating urine).  Warm Saline SITZ Baths to Reduce Pain:  Sit in a warm saline bath for 20 minutes to cleanse the area and to reduce pain. Add 2 oz. of table salt or baking soda to a tub of water.  General Care Advice for Breathing Difficulty:  Find position of greatest comfort. For most patients the best position is semi-upright (e.g., sitting up in a comfortable chair or lying back against pillows).  Elevate head of bed (e.g., use pillows or place blocks under bed).  Limit activities or space activities apart during the day. Prioritize  activities.  Call Back If:  Severe difficulty breathing occurs  You become worse.  RN Overrode Recommendation:  Document Patient  Note to office to consider urgent visit workin.   Next office visit 2:15 pm

## 2012-06-14 ENCOUNTER — Other Ambulatory Visit (INDEPENDENT_AMBULATORY_CARE_PROVIDER_SITE_OTHER): Payer: Managed Care, Other (non HMO)

## 2012-06-14 ENCOUNTER — Encounter: Payer: Self-pay | Admitting: Family Medicine

## 2012-06-14 ENCOUNTER — Ambulatory Visit (INDEPENDENT_AMBULATORY_CARE_PROVIDER_SITE_OTHER): Payer: Managed Care, Other (non HMO) | Admitting: Family Medicine

## 2012-06-14 ENCOUNTER — Ambulatory Visit: Payer: Managed Care, Other (non HMO)

## 2012-06-14 VITALS — BP 100/70 | HR 83 | Temp 98.6°F | Wt 180.0 lb

## 2012-06-14 DIAGNOSIS — R0789 Other chest pain: Secondary | ICD-10-CM

## 2012-06-14 DIAGNOSIS — J9801 Acute bronchospasm: Secondary | ICD-10-CM

## 2012-06-14 DIAGNOSIS — N390 Urinary tract infection, site not specified: Secondary | ICD-10-CM

## 2012-06-14 DIAGNOSIS — R7401 Elevation of levels of liver transaminase levels: Secondary | ICD-10-CM

## 2012-06-14 DIAGNOSIS — E876 Hypokalemia: Secondary | ICD-10-CM

## 2012-06-14 LAB — BASIC METABOLIC PANEL
BUN: 9 mg/dL (ref 6–23)
Chloride: 103 mEq/L (ref 96–112)
Glucose, Bld: 88 mg/dL (ref 70–99)
Potassium: 4.3 mEq/L (ref 3.5–5.1)

## 2012-06-14 LAB — POCT URINALYSIS DIPSTICK
Bilirubin (Urine): NEGATIVE
Blood, UA: NEGATIVE
Glucose, UA: NEGATIVE
Nitrite, UA: NEGATIVE
Spec Grav, UA: <=1.005

## 2012-06-14 LAB — GAMMA GT: GGT: 19 U/L (ref 7–51)

## 2012-06-14 LAB — HEPATIC FUNCTION PANEL
ALT: 14 U/L (ref 0–35)
AST: 18 U/L (ref 0–37)
Albumin: 3.9 g/dL (ref 3.5–5.2)
Total Protein: 7.2 g/dL (ref 6.0–8.3)

## 2012-06-14 MED ORDER — ALBUTEROL SULFATE HFA 108 (90 BASE) MCG/ACT IN AERS: 2.0000 | INHALATION_SPRAY | Freq: Four times a day (QID) | RESPIRATORY_TRACT | Status: DC | PRN

## 2012-06-14 MED ORDER — ALBUTEROL SULFATE (2.5 MG/3ML) 0.083% IN NEBU
2.5000 mg | INHALATION_SOLUTION | Freq: Once | RESPIRATORY_TRACT | Status: AC
  Administered 2012-06-14: 2.5 mg via RESPIRATORY_TRACT

## 2012-06-14 MED ORDER — MOMETASONE FURO-FORMOTEROL FUM 100-5 MCG/ACT IN AERO: 2.0000 | INHALATION_SPRAY | Freq: Two times a day (BID) | RESPIRATORY_TRACT | Status: DC

## 2012-06-14 NOTE — Patient Instructions (Signed)
Bronchospasm, Adult  Bronchospasm means that there is a spasm or tightening of the airways going into the lungs. Because the airways go into a spasm and get smaller it makes breathing more difficult.  For reasons not completely known, workings (functions) of the airways designed to protect the lungs become over active. This causes the airways to become more sensitive to:   Infection.   Weather.   Exercise.   Irritants.   Things that cause allergic reactions or allergies (allergens).  Frequent coughing or respiratory episodes should be checked for the cause. This condition may be made worse by exercise.  CAUSES   Inflammation is often the cause of this condition. Allergy, viral respiratory infections, or irritants in the air often cause this problem. Allergic reactions produce immediate and delayed responses. Late reactions may produce more serious inflammation. This may lead to increased reactivity of the airways. Sometimes this is inherited.  Some common triggers are:   Allergies.   Infection commonly triggers attacks. Antibiotics are not helpful for viral infections and usually do not help with attacks of bronchospasm.   Exercise (running, etc.) can trigger an attack. Proper pre-exercise medications help most individuals participate in sports. Swimming is the least likely sport to cause problems.   Irritants (for example, pollution, cigarette smoke, strong odors, aerosol sprays, paint fumes, etc.) may trigger attacks. You cannot smoke and do not allow smoking in your home. This is absolutely necessary. Show this instruction to mates, relatives and significant others that may not agree with you.   Weather changes may cause lung problems but moving around trying to find an ideal climate does not seem to be overly helpful. Winds increase molds and pollens in the air. Rain refreshes the air by washing irritants out. Cold air may cause irritation.   Emotional problems do not cause lung problems but can  trigger attacks.  SYMPTOMS   Wheezing is the most common symptom. Frequent coughing (with or without exercise and or crying) and repeated respiratory infections are all early warning signs of bronchospasm. Chest tightness and shortness of breath are other symptoms.  DIAGNOSIS   Early hidden bronchospasm may go for long periods of time without being detected. This is especially true if wheezing cannot be detected by your caregiver. Lung (pulmonary) function studies may help with diagnosis in these cases.  HOME CARE INSTRUCTIONS    It is necessary to remain calm during an attack. Try to relax and breathe more slowly. During this time medications may be given. If any breathing problems seem to be getting worse and are unresponsive to treatment seek immediate medical care.   If you have severe breathing difficulty or have had a life threatening attack it is probably a good idea for you to learn how to give adrenaline (epi-pen) or use an anaphylaxis kit. Your caregiver can help you with this. These are the same kits carried by people who have severe allergic reactions. This is especially important if you do not have readily accessible medical care.   With any severe breathing problems where epinephrine (adrenaline) has been given at home call 911 immediately as the delayed reaction may be even more severe.  SEEK MEDICAL CARE IF:    There is wheezing and shortness of breath, even if medications are given to prevent attacks.   An oral temperature above 102 F (38.9 C) develops.   There are muscle aches, chest pain, or thickening of sputum.   The sputum changes from clear or white to yellow, green,   gray, or bloody.   There are problems that may be related to the medicine you are given, such as a rash, itching, swelling, or trouble breathing.  SEEK IMMEDIATE MEDICAL CARE IF:    The usual medicines do not stop your wheezing, or there is increased coughing.   You have increased difficulty breathing.  MAKE SURE YOU:     Understand these instructions.   Will watch your condition.   Will get help right away if you are not doing well or get worse.  Document Released: 04/08/2003 Document Revised: 06/28/2011 Document Reviewed: 11/22/2007  [employer] Patient Information 2013 [employer].

## 2012-06-14 NOTE — Progress Notes (Signed)
  Subjective:    Cheryl Patterson is a 42 y.o. female who presents for evaluation of chest pain. Onset was several  days ago. Symptoms have been unchanged since that time. The patient describes the pain as tightness and does not radiate. Patient rates pain as a 3/10 in intensity. Associated symptoms are: chest pressure/discomfort, dyspnea and palpitations. Aggravating factors are: cold air, emotional stress, exercise and walking. Alleviating factors are: rest. Patient's cardiac risk factors are: obesity (BMI >= 30 kg/m2) and sedentary lifestyle. Patient's risk factors for DVT/PE: none. Previous cardiac testing: chest x-ray, electrocardiogram (ECG), HDL, kidney function, LDL, potassium, thyroid function, triglycerides and urinalysis.  The following portions of the patient's history were reviewed and updated as appropriate: allergies, current medications, past family history, past medical history, past social history, past surgical history and problem list.  Review of Systems Pertinent items are noted in HPI.    Objective:    BP 100/70  Pulse 83  Temp(Src) 98.6 F (37 C) (Oral)  Wt 180 lb (81.647 kg)  BMI 28.19 kg/m2  SpO2 98%  LMP 05/03/2012 General appearance: alert, cooperative, appears stated age and no distress Ears: normal TM's and external ear canals both ears Nose: Nares normal. Septum midline. Mucosa normal. No drainage or sinus tenderness. Throat: lips, mucosa, and tongue normal; teeth and gums normal Neck: no adenopathy, no carotid bruit, no JVD, supple, symmetrical, trachea midline and thyroid not enlarged, symmetric, no tenderness/mass/nodules Lungs: clear to auscultation bilaterally Heart: S1, S2 normal Extremities: extremities normal, atraumatic, no cyanosis or edema  Cardiographics ECG: not done  Imaging Chest x-ray: normal chest x-ray    Assessment:    Chest pain, suspected etiology: bronchospasm    Plan:    Patient history and exam consistent with non-cardiac  cause of chest pain. Worsening signs and symptoms discussed and patient verbalized understanding. Follow up with me in 2 weeks. [name]

## 2012-06-15 ENCOUNTER — Encounter: Payer: Self-pay | Admitting: Family Medicine

## 2012-07-04 ENCOUNTER — Encounter: Payer: Self-pay | Admitting: Family Medicine

## 2012-09-08 ENCOUNTER — Ambulatory Visit (INDEPENDENT_AMBULATORY_CARE_PROVIDER_SITE_OTHER): Payer: Managed Care, Other (non HMO) | Admitting: Family Medicine

## 2012-09-08 ENCOUNTER — Encounter: Payer: Self-pay | Admitting: Family Medicine

## 2012-09-08 VITALS — BP 104/62 | HR 68 | Temp 99.0°F | Wt 193.6 lb

## 2012-09-08 DIAGNOSIS — F988 Other specified behavioral and emotional disorders with onset usually occurring in childhood and adolescence: Secondary | ICD-10-CM

## 2012-09-08 MED ORDER — AMPHETAMINE-DEXTROAMPHETAMINE 20 MG PO TABS: 20.0000 mg | ORAL_TABLET | Freq: Two times a day (BID) | ORAL | Status: DC

## 2012-09-08 NOTE — Assessment & Plan Note (Signed)
Restart adderall 20 mg bid  rto 6 months or sooner prn

## 2012-09-08 NOTE — Patient Instructions (Signed)
Attention Deficit Hyperactivity Disorder Attention deficit hyperactivity disorder (ADHD) is a problem with behavior issues based on the way the brain functions (neurobehavioral disorder). It is a common reason for behavior and academic problems in school. CAUSES  The cause of ADHD is unknown in most cases. It may run in families. It sometimes can be associated with learning disabilities and other behavioral problems. SYMPTOMS  There are 3 types of ADHD. The 3 types and some of the symptoms include:  Inattentive  Gets bored or distracted easily.  Loses or forgets things. Forgets to hand in homework.  Has trouble organizing or completing tasks.  Difficulty staying on task.  An inability to organize daily tasks and school work.  Leaving projects, chores, or homework unfinished.  Trouble paying attention or responding to details. Careless mistakes.  Difficulty following directions. Often seems like is not listening.  Dislikes activities that require sustained attention (like chores or homework).  Hyperactive-impulsive  Feels like it is impossible to sit still or stay in a seat. Fidgeting with hands and feet.  Trouble waiting turn.  Talking too much or out of turn. Interruptive.  Speaks or acts impulsively.  Aggressive, disruptive behavior.  Constantly busy or on the go, noisy.  Combined  Has symptoms of both of the above. Often children with ADHD feel discouraged about themselves and with school. They often perform well below their abilities in school. These symptoms can cause problems in home, school, and in relationships with peers. As children get older, the excess motor activities can calm down, but the problems with paying attention and staying organized persist. Most children do not outgrow ADHD but with good treatment can learn to cope with the symptoms. DIAGNOSIS  When ADHD is suspected, the diagnosis should be made by professionals trained in ADHD.  Diagnosis will  include:  Ruling out other reasons for the child's behavior.  The caregivers will check with the child's school and check their medical records.  They will talk to teachers and parents.  Behavior rating scales for the child will be filled out by those dealing with the child on a daily basis. A diagnosis is made only after all information has been considered. TREATMENT  Treatment usually includes behavioral treatment often along with medicines. It may include stimulant medicines. The stimulant medicines decrease impulsivity and hyperactivity and increase attention. Other medicines used include antidepressants and certain blood pressure medicines. Most experts agree that treatment for ADHD should address all aspects of the child's functioning. Treatment should not be limited to the use of medicines alone. Treatment should include structured classroom management. The parents must receive education to address rewarding good behavior, discipline, and limit-setting. Tutoring or behavioral therapy or both should be available for the child. If untreated, the disorder can have long-term serious effects into adolescence and adulthood. HOME CARE INSTRUCTIONS   Often with ADHD there is a lot of frustration among the family in dealing with the illness. There is often blame and anger that is not warranted. This is a life long illness. There is no way to prevent ADHD. In many cases, because the problem affects the family as a whole, the entire family may need help. A therapist can help the family find better ways to handle the disruptive behaviors and promote change. If the child is young, most of the therapist's work is with the parents. Parents will learn techniques for coping with and improving their child's behavior. Sometimes only the child with the ADHD needs counseling. Your caregivers can help   you make these decisions.  Children with ADHD may need help in organizing. Some helpful tips include:  Keep  routines the same every day from wake-up time to bedtime. Schedule everything. This includes homework and playtime. This should include outdoor and indoor recreation. Keep the schedule on the refrigerator or a bulletin board where it is frequently seen. Mark schedule changes as far in advance as possible.  Have a place for everything and keep everything in its place. This includes clothing, backpacks, and school supplies.  Encourage writing down assignments and bringing home needed books.  Offer your child a well-balanced diet. Breakfast is especially important for school performance. Children should avoid drinks with caffeine including:  Soft drinks.  Coffee.  Tea.  However, some older children (adolescents) may find these drinks helpful in improving their attention.  Children with ADHD need consistent rules that they can understand and follow. If rules are followed, give small rewards. Children with ADHD often receive, and expect, criticism. Look for good behavior and praise it. Set realistic goals. Give clear instructions. Look for activities that can foster success and self-esteem. Make time for pleasant activities with your child. Give lots of affection.  Parents are their children's greatest advocates. Learn as much as possible about ADHD. This helps you become a stronger and better advocate for your child. It also helps you educate your child's teachers and instructors if they feel inadequate in these areas. Parent support groups are often helpful. A national group with local chapters is called CHADD (Children and Adults with Attention Deficit Hyperactivity Disorder). PROGNOSIS  There is no cure for ADHD. Children with the disorder seldom outgrow it. Many find adaptive ways to accommodate the ADHD as they mature. SEEK MEDICAL CARE IF:  Your child has repeated muscle twitches, cough or speech outbursts.  Your child has sleep problems.  Your child has a marked loss of  appetite.  Your child develops depression.  Your child has new or worsening behavioral problems.  Your child develops dizziness.  Your child has a racing heart.  Your child has stomach pains.  Your child develops headaches. Document Released: 03/26/2002 Document Revised: 06/28/2011 Document Reviewed: 11/06/2007 Exit[employer] Patient Information 2014 [employer].  

## 2012-09-08 NOTE — Progress Notes (Signed)
  Subjective:    Patient ID: Cheryl Patterson, female    DOB: 03/25/1971, 42 y.o.   MRN: [id number]  HPI Pt here to discuss restarting adderall.  She is struggling to focus and is unable to complete tasks.     Review of Systems As above     Objective:   Physical Exam  BP 104/62  Pulse 68  Temp(Src) 99 F (37.2 C) (Oral)  Wt 193 lb 9.6 oz (87.816 kg)  BMI 30.31 kg/m2  SpO2 95% General appearance: alert, cooperative, appears stated age and no distress Neck: no adenopathy, no carotid bruit, no JVD, supple, symmetrical, trachea midline and thyroid not enlarged, symmetric, no tenderness/mass/nodules Lungs: clear to auscultation bilaterally Heart: S1, S2 normal      Assessment & Plan:

## 2012-11-27 ENCOUNTER — Encounter: Payer: Self-pay | Admitting: Family Medicine

## 2012-11-27 ENCOUNTER — Ambulatory Visit (INDEPENDENT_AMBULATORY_CARE_PROVIDER_SITE_OTHER): Payer: Managed Care, Other (non HMO) | Admitting: Family Medicine

## 2012-11-27 VITALS — BP 98/76 | HR 63 | Temp 98.3°F | Ht 67.0 in | Wt 193.0 lb

## 2012-11-27 DIAGNOSIS — E669 Obesity, unspecified: Secondary | ICD-10-CM

## 2012-11-27 MED ORDER — DIETHYLPROPION HCL ER 75 MG PO TB24: 1.0000 | ORAL_TABLET | Freq: Every day | ORAL | Status: DC

## 2012-11-27 NOTE — Patient Instructions (Signed)
Calorie Counting Diet A calorie counting diet requires you to eat the number of calories that are right for you in a day. Calories are the measurement of how much energy you get from the food you eat. Eating the right amount of calories is important for staying at a healthy weight. If you eat too many calories, your body will store them as fat and you may gain weight. If you eat too few calories, you may lose weight. Counting the number of calories you eat during a day will help you know if you are eating the right amount. A Registered Dietitian can determine how many calories you need in a day. The amount of calories needed varies from person to person. If your goal is to lose weight, you will need to eat fewer calories. Losing weight can benefit you if you are overweight or have health problems such as heart disease, high blood pressure, or diabetes. If your goal is to gain weight, you will need to eat more calories. Gaining weight may be necessary if you have a certain health problem that causes your body to need more energy. TIPS Whether you are increasing or decreasing the number of calories you eat during a day, it may be hard to get used to changes in what you eat and drink. The following are tips to help you keep track of the number of calories you eat.  Measure foods at home with measuring cups. This helps you know the amount of food and number of calories you are eating.  Restaurants often serve food in amounts that are larger than 1 serving. While eating out, estimate how many servings of a food you are given. For example, a serving of cooked rice is  cup or about the size of half of a fist. Knowing serving sizes will help you be aware of how much food you are eating at restaurants.  Ask for smaller portion sizes or child-size portions at restaurants.  Plan to eat half of a meal at a restaurant. Take the rest home or share the other half with a friend.  Read the Nutrition Facts panel on  food labels for calorie content and serving size. You can find out how many servings are in a package, the size of a serving, and the number of calories each serving has.  For example, a package might contain 3 cookies. The Nutrition Facts panel on that package says that 1 serving is 1 cookie. Below that, it will say there are 3 servings in the container. The calories section of the Nutrition Facts label says there are 90 calories. This means there are 90 calories in 1 cookie (1 serving). If you eat 1 cookie you have eaten 90 calories. If you eat all 3 cookies, you have eaten 270 calories (3 servings x 90 calories = 270 calories). The list below tells you how big or small some common portion sizes are.  1 oz.........4 stacked dice.  3 oz.........Deck of cards.  1 tsp........Tip of little finger.  1 tbs........Thumb.  2 tbs........Golf ball.   cup.......Half of a fist.  1 cup........A fist. KEEP A FOOD LOG Write down every food item you eat, the amount you eat, and the number of calories in each food you eat during the day. At the end of the day, you can add up the total number of calories you have eaten. It may help to keep a list like the one below. Find out the calorie information by reading the   Nutrition Facts panel on food labels. Breakfast  Bran cereal (1 cup, 110 calories).  Fat-free milk ( cup, 45 calories). Snack  Apple (1 medium, 80 calories). Lunch  Spinach (1 cup, 20 calories).  Tomato ( medium, 20 calories).  Chicken breast strips (3 oz, 165 calories).  Shredded cheddar cheese ( cup, 110 calories).  Light Italian dressing (2 tbs, 60 calories).  Whole-wheat bread (1 slice, 80 calories).  Tub margarine (1 tsp, 35 calories).  Vegetable soup (1 cup, 160 calories). Dinner  Pork chop (3 oz, 190 calories).  Brown rice (1 cup, 215 calories).  Steamed broccoli ( cup, 20 calories).  Strawberries (1  cup, 65 calories).  Whipped cream (1 tbs, 50  calories). Daily Calorie Total: 1425 Document Released: 04/05/2005 Document Revised: 06/28/2011 Document Reviewed: 09/30/2006 Exit[employer] Patient Information 2014 [employer].  

## 2012-11-27 NOTE — Progress Notes (Signed)
  Subjective:    Patient ID: Cheryl Patterson, female    DOB: 1971-01-30, 42 y.o.   MRN: [id number]  HPI Pt here to discuss the need to lose weight.  She is struggling.  She is exercising and trying to watch what they are eating. Review of Systems As above    Objective:   Physical Exam  BP 98/76  Pulse 63  Temp(Src) 98.3 F (36.8 C) (Oral)  Ht 5\' 7"  (1.702 m)  Wt 193 lb (87.544 kg)  BMI 30.22 kg/m2  SpO2 98% General appearance: alert, cooperative, appears stated age and no distress Neck: no adenopathy, supple, symmetrical, trachea midline and thyroid not enlarged, symmetric, no tenderness/mass/nodules Lungs: clear to auscultation bilaterally Heart: S1, S2 normal EKG-- NSR       Assessment & Plan:

## 2012-11-27 NOTE — Assessment & Plan Note (Signed)
[place] per orders con't diet and exercise rto 1 month

## 2013-01-01 ENCOUNTER — Ambulatory Visit (INDEPENDENT_AMBULATORY_CARE_PROVIDER_SITE_OTHER): Payer: Managed Care, Other (non HMO) | Admitting: Family Medicine

## 2013-01-01 ENCOUNTER — Encounter: Payer: Self-pay | Admitting: Family Medicine

## 2013-01-01 VITALS — BP 104/68 | HR 93 | Temp 98.9°F | Wt 190.0 lb

## 2013-01-01 DIAGNOSIS — E669 Obesity, unspecified: Secondary | ICD-10-CM

## 2013-01-01 MED ORDER — DIETHYLPROPION HCL ER 75 MG PO TB24: 1.0000 | ORAL_TABLET | Freq: Every day | ORAL | Status: DC

## 2013-01-01 NOTE — Patient Instructions (Signed)
Obesity Obesity is defined as having too much total body fat and a body mass index (BMI) of 30 or more. BMI is an estimate of body fat and is calculated from your height and weight. Obesity happens when you consume more calories than you can burn by exercising or performing daily physical tasks. Prolonged obesity can cause major illnesses or emergencies, such as:   A stroke.  Heart disease.  Diabetes.  Cancer.  Arthritis.  High blood pressure (hypertension).  High cholesterol.  Sleep apnea.  Erectile dysfunction.  Infertility problems. CAUSES   Regularly eating unhealthy foods.  Physical inactivity.  Certain disorders, such as an underactive thyroid (hypothyroidism), Cushing's syndrome, and polycystic ovarian syndrome.  Certain medicines, such as steroids, some depression medicines, and antipsychotics.  Genetics.  Lack of sleep. DIAGNOSIS  A caregiver can diagnose obesity after calculating your BMI. Obesity will be diagnosed if your BMI is 30 or higher.  There are other methods of measuring obesity levels. Some other methods include measuring your skin fold thickness, your waist circumference, and comparing your hip circumference to your waist circumference. TREATMENT  A healthy treatment program includes some or all of the following:  Long-term dietary changes.  Exercise and physical activity.  Behavioral and lifestyle changes.  Medicine only under the supervision of your caregiver. Medicines may help, but only if they are used with diet and exercise programs. An unhealthy treatment program includes:  Fasting.  Fad diets.  Supplements and drugs. These choices do not succeed in long-term weight control.  HOME CARE INSTRUCTIONS   Exercise and perform physical activity as directed by your caregiver. To increase physical activity, try the following:  Use stairs instead of elevators.  Park farther away from store entrances.  Garden, bike, or walk instead of  watching television or using the computer.  Eat healthy, low-calorie foods and drinks on a regular basis. Eat more fruits and vegetables. Use low-calorie cookbooks or take healthy cooking classes.  Limit fast food, sweets, and processed snack foods.  Eat smaller portions.  Keep a daily journal of everything you eat. There are many free websites to help you with this. It may be helpful to measure your foods so you can determine if you are eating the correct portion sizes.  Avoid drinking alcohol. Drink more water and drinks without calories.  Take vitamins and supplements only as recommended by your caregiver.  Weight-loss support groups, Registered Dieticians, counselors, and stress reduction education can also be very helpful. SEEK IMMEDIATE MEDICAL CARE IF:  You have chest pain or tightness.  You have trouble breathing or feel short of breath.  You have weakness or leg numbness.  You feel confused or have trouble talking.  You have sudden changes in your vision. MAKE SURE YOU:  Understand these instructions.  Will watch your condition.  Will get help right away if you are not doing well or get worse. Document Released: 05/13/2004 Document Revised: 10/05/2011 Document Reviewed: 05/12/2011 Exit[employer] Patient Information 2014 [employer].  

## 2013-01-01 NOTE — Progress Notes (Signed)
  Subjective:    Patient ID: Cheryl Patterson, female    DOB: [birth year], 42 y.o.   MRN: [id number]  HPI Pt here for weight check.  She is exercising -- walking 3-4 x days a week.  Pt is cutting down on sugar.     Review of Systems As above    Objective:   Physical Exam BP 104/68  Pulse 93  Temp(Src) 98.9 F (37.2 C) (Oral)  Wt 190 lb (86.183 kg)  BMI 29.75 kg/m2  SpO2 98% General appearance: alert, cooperative, appears stated age and no distress Throat: lips, mucosa, and tongue normal; teeth and gums normal Neck: no adenopathy, no carotid bruit, no JVD, supple, symmetrical, trachea midline and thyroid not enlarged, symmetric, no tenderness/mass/nodules Lungs: clear to auscultation bilaterally Heart: S1, S2 normal Extremities: extremities normal, atraumatic, no cyanosis or edema        Assessment & Plan:

## 2013-01-01 NOTE — Assessment & Plan Note (Signed)
Refill meds con't diet and exercise rto 1 month

## 2013-02-07 ENCOUNTER — Encounter: Payer: Self-pay | Admitting: Internal Medicine

## 2013-02-07 ENCOUNTER — Ambulatory Visit (INDEPENDENT_AMBULATORY_CARE_PROVIDER_SITE_OTHER): Payer: Managed Care, Other (non HMO) | Admitting: Internal Medicine

## 2013-02-07 VITALS — BP 124/78 | HR 82 | Temp 99.2°F | Wt 197.0 lb

## 2013-02-07 DIAGNOSIS — J069 Acute upper respiratory infection, unspecified: Secondary | ICD-10-CM

## 2013-02-07 MED ORDER — AMOXICILLIN 500 MG PO CAPS: 1000.0000 mg | ORAL_CAPSULE | Freq: Two times a day (BID) | ORAL | Status: DC

## 2013-02-07 NOTE — Patient Instructions (Signed)
Rest, fluids , tylenol For cough, take Mucinex DM twice a day as needed  For congestion use Dymista 2 sprays on each side of the nose at night until samples gone Take the antibiotic as prescribed  (Amoxicillin) if no better in few days  Call if no better by next week Call anytime if the symptoms are severe

## 2013-02-07 NOTE — Progress Notes (Signed)
  Subjective:    Patient ID: Cheryl Patterson, female    DOB: 23-Mar-1971, 42 y.o.   MRN: [id number]  HPI Acute visit Symptoms started 5 days ago with sore throat, sinus congestion, headache. Taking Mucinex and Tylenol Cold OTC. Overall this morning feels a little better but is not completely well, still have sinus congestion. She is concerned because she is taking a trip very soon.  Past Medical History  Diagnosis Date  . Hyperlipidemia   . Chronic headaches   . Depression     [facility]---suicidal ideation  . Bipolar depression   . Allergy   . Anxiety   . Chronic kidney disease     kidney stones   Past Surgical History  Procedure Laterality Date  . Tubal ligation    . [place]  2005  . Breast surgery      BREAST LUMP REMOVAL ??WHICH BREAST  . Other surgical history      RENAL STONE REMOVAL  . Mouth surgery    . Lipoma excision    . Kidney stones     History  Substance Use Topics  . Smoking status: Never Smoker   . Smokeless tobacco: Never Used  . Alcohol Use: No     Review of Systems No fever or chills No nausea, vomiting, diarrhea. No myalgias. No trips outside [place].     Objective:   Physical Exam  BP 124/78  Pulse 82  Temp(Src) 99.2 F (37.3 C)  Wt 197 lb (89.359 kg)  BMI 30.85 kg/m2  SpO2 100% General -- alert, well-developed, NAD.  HEENT-- Not pale. TMs normal, throat symmetric, no redness or discharge. Face symmetric, sinuses not tender to palpation. Nose moderately congested.  Lungs -- normal respiratory effort, no intercostal retractions, no accessory muscle use, and normal breath sounds.  Heart-- normal rate, regular rhythm, no murmur.  Extremities-- no pretibial edema bilaterally  Neurologic--  alert & oriented X3. Speech normal, gait normal, strength normal in all extremities.  Psych-- Cognition and judgment appear intact. Cooperative with normal attention span and concentration. No anxious appearing , no depressed appearing.      Assessment & Plan:  URI, Symptoms consistent with URI, she has bronchodilators that she uses when she has bronchitis, at this point she has not need to use them. Plan:  see instructions

## 2013-04-10 ENCOUNTER — Other Ambulatory Visit: Payer: Self-pay | Admitting: *Deleted

## 2013-04-10 ENCOUNTER — Telehealth: Payer: Self-pay | Admitting: *Deleted

## 2013-04-10 MED ORDER — AMPHETAMINE-DEXTROAMPHET ER 20 MG PO CP24: 20.0000 mg | ORAL_CAPSULE | Freq: Two times a day (BID) | ORAL | Status: DC

## 2013-04-10 NOTE — Telephone Encounter (Signed)
Med filled. Patient notified 

## 2013-04-10 NOTE — Telephone Encounter (Signed)
Patient is requesting refill on Adderall  Last seen-02/07/2013  Last filled- 09/08/2012  [facility]-06/14/2012 low risk, contract signed  Please advise. SW

## 2013-04-10 NOTE — Telephone Encounter (Signed)
OK X 1 month 

## 2013-04-10 NOTE — Addendum Note (Signed)
Addended by: Dion Body on: 04/10/2013 11:12 AM   Modules accepted: Orders

## 2013-04-11 ENCOUNTER — Telehealth: Payer: Self-pay | Admitting: *Deleted

## 2013-04-11 DIAGNOSIS — F988 Other specified behavioral and emotional disorders with onset usually occurring in childhood and adolescence: Secondary | ICD-10-CM

## 2013-04-11 MED ORDER — AMPHETAMINE-DEXTROAMPHETAMINE 30 MG PO TABS: 30.0000 mg | ORAL_TABLET | Freq: Two times a day (BID) | ORAL | Status: DC

## 2013-04-11 NOTE — Telephone Encounter (Signed)
Patient called and stated that she came and picked up her prescription for Adderall, but was given a prescription for Adderall XR. Patient states that she was on Adderall 30mg  instead of Adderall 20mg  XR. Patient would like to get a prescription for Adderall 30mg .Patient was advised to bring the old prescription back when picking up new prescription. SW

## 2013-06-25 ENCOUNTER — Encounter: Payer: Self-pay | Admitting: Family Medicine

## 2013-06-25 ENCOUNTER — Ambulatory Visit (INDEPENDENT_AMBULATORY_CARE_PROVIDER_SITE_OTHER): Payer: BC Managed Care – PPO | Admitting: Family Medicine

## 2013-06-25 VITALS — BP 114/62 | HR 90 | Temp 98.4°F | Wt 195.4 lb

## 2013-06-25 DIAGNOSIS — F988 Other specified behavioral and emotional disorders with onset usually occurring in childhood and adolescence: Secondary | ICD-10-CM

## 2013-06-25 DIAGNOSIS — K644 Residual hemorrhoidal skin tags: Secondary | ICD-10-CM

## 2013-06-25 DIAGNOSIS — B354 Tinea corporis: Secondary | ICD-10-CM

## 2013-06-25 MED ORDER — NAFTIFINE HCL 1 % EX CREA: TOPICAL_CREAM | Freq: Every day | CUTANEOUS | Status: DC

## 2013-06-25 MED ORDER — AMPHETAMINE-DEXTROAMPHETAMINE 30 MG PO TABS: 30.0000 mg | ORAL_TABLET | Freq: Two times a day (BID) | ORAL | Status: DC

## 2013-06-25 MED ORDER — HYDROCORTISONE ACE-PRAMOXINE 1-1 % RE FOAM: 1.0000 | Freq: Two times a day (BID) | RECTAL | Status: DC

## 2013-06-25 NOTE — Patient Instructions (Signed)
Hemorrhoids Hemorrhoids are swollen veins around the rectum or anus. There are two types of hemorrhoids:   Internal hemorrhoids. These occur in the veins just inside the rectum. They may poke through to the outside and become irritated and painful.  External hemorrhoids. These occur in the veins outside the anus and can be felt as a painful swelling or hard lump near the anus. CAUSES  Pregnancy.   Obesity.   Constipation or diarrhea.   Straining to have a bowel movement.   Sitting for long periods on the toilet.  Heavy lifting or other activity that caused you to strain.  Anal intercourse. SYMPTOMS   Pain.   Anal itching or irritation.   Rectal bleeding.   Fecal leakage.   Anal swelling.   One or more lumps around the anus.  DIAGNOSIS  Your caregiver may be able to diagnose hemorrhoids by visual examination. Other examinations or tests that may be performed include:   Examination of the rectal area with a gloved hand (digital rectal exam).   Examination of anal canal using a small tube (scope).   A blood test if you have lost a significant amount of blood.  A test to look inside the colon (sigmoidoscopy or colonoscopy). TREATMENT Most hemorrhoids can be treated at home. However, if symptoms do not seem to be getting better or if you have a lot of rectal bleeding, your caregiver may perform a procedure to help make the hemorrhoids get smaller or remove them completely. Possible treatments include:   Placing a rubber band at the base of the hemorrhoid to cut off the circulation (rubber band ligation).   Injecting a chemical to shrink the hemorrhoid (sclerotherapy).   Using a tool to burn the hemorrhoid (infrared light therapy).   Surgically removing the hemorrhoid (hemorrhoidectomy).   Stapling the hemorrhoid to block blood flow to the tissue (hemorrhoid stapling).  HOME CARE INSTRUCTIONS   Eat foods with fiber, such as whole grains, beans,  nuts, fruits, and vegetables. Ask your doctor about taking products with added fiber in them (fibersupplements).  Increase fluid intake. Drink enough water and fluids to keep your urine clear or pale yellow.   Exercise regularly.   Go to the bathroom when you have the urge to have a bowel movement. Do not wait.   Avoid straining to have bowel movements.   Keep the anal area dry and clean. Use wet toilet paper or moist towelettes after a bowel movement.   Medicated creams and suppositories may be used or applied as directed.   Only take over-the-counter or prescription medicines as directed by your caregiver.   Take warm sitz baths for 15 20 minutes, 3 4 times a day to ease pain and discomfort.   Place ice packs on the hemorrhoids if they are tender and swollen. Using ice packs between sitz baths may be helpful.   Put ice in a plastic bag.   Place a towel between your skin and the bag.   Leave the ice on for 15 20 minutes, 3 4 times a day.   Do not use a donut-shaped pillow or sit on the toilet for long periods. This increases blood pooling and pain.  SEEK MEDICAL CARE IF:  You have increasing pain and swelling that is not controlled by treatment or medicine.  You have uncontrolled bleeding.  You have difficulty or you are unable to have a bowel movement.  You have pain or inflammation outside the area of the hemorrhoids. MAKE SURE YOU:    Understand these instructions.  Will watch your condition.  Will get help right away if you are not doing well or get worse. Document Released: 04/02/2000 Document Revised: 03/22/2012 Document Reviewed: 02/08/2012 [employer] Patient Information 2014 [employer].  

## 2013-06-25 NOTE — Progress Notes (Signed)
Patient ID: Cheryl Patterson, female   DOB: 08-20-1970, 43 y.o.   MRN: [id number]   Subjective:    Patient ID: Cheryl Patterson, female    DOB: 30-Nov-1970, 43 y.o.   MRN: [id number] HPI Pt here c/o bleeding hemorrhoids and otc meds have not worked.   Pt also needs a refill on her add meds and c/o rash on r buttock that is itchy.           Objective:    BP 114/62  Pulse 90  Temp(Src) 98.4 F (36.9 C) (Oral)  Wt 195 lb 6.4 oz (88.633 kg)  SpO2 99% General appearance: alert, cooperative, appears stated age and no distress Skin: hyperpigmentation - buttock(s) right Lymph nodes: Cervical, supraclavicular, and axillary nodes normal. Rectum--+ small ext hemorrhoids        Assessment & Plan:  1. ADD (attention deficit disorder) Refill med--- stable - amphetamine-dextroamphetamine (ADDERALL) 30 MG tablet; Take 1 tablet (30 mg total) by mouth 2 (two) times daily.  Dispense: 60 tablet; Refill: 0  2. External hemorrhoid, bleeding Try rx and is no better we will refer to a specialist - hydrocortisone-pramoxine (PROCTOFOAM HC) rectal foam; Place 1 applicator rectally 2 (two) times daily.  Dispense: 10 g; Refill: 0  3. Tinea corporis - naftifine (NAFTIN) 1 % cream; Apply topically daily.  Dispense: 30 g; Refill: 0

## 2013-06-25 NOTE — Progress Notes (Signed)
Pre visit review using our clinic review tool, if applicable. No additional management support is needed unless otherwise documented below in the visit note. 

## 2013-06-25 NOTE — Assessment & Plan Note (Signed)
Proctofoam Refer to GI / surgery if no improvement HO given

## 2013-09-26 ENCOUNTER — Ambulatory Visit (INDEPENDENT_AMBULATORY_CARE_PROVIDER_SITE_OTHER): Payer: BC Managed Care – PPO | Admitting: Women's Health

## 2013-09-26 ENCOUNTER — Encounter: Payer: Self-pay | Admitting: Women's Health

## 2013-09-26 ENCOUNTER — Other Ambulatory Visit (HOSPITAL_COMMUNITY)
Admission: RE | Admit: 2013-09-26 | Discharge: 2013-09-26 | Disposition: A | Discharge status: Home / Self Care | Payer: BC Managed Care – PPO | Source: Ambulatory Visit | Attending: Women's Health | Admitting: Women's Health

## 2013-09-26 VITALS — BP 120/75 | Ht 67.0 in | Wt 199.8 lb

## 2013-09-26 DIAGNOSIS — Z833 Family history of diabetes mellitus: Secondary | ICD-10-CM

## 2013-09-26 DIAGNOSIS — Z13220 Encounter for screening for lipoid disorders: Secondary | ICD-10-CM

## 2013-09-26 DIAGNOSIS — Z01419 Encounter for gynecological examination (general) (routine) without abnormal findings: Secondary | ICD-10-CM

## 2013-09-26 DIAGNOSIS — Z1151 Encounter for screening for human papillomavirus (HPV): Secondary | ICD-10-CM | POA: Insufficient documentation

## 2013-09-26 DIAGNOSIS — Z113 Encounter for screening for infections with a predominantly sexual mode of transmission: Secondary | ICD-10-CM

## 2013-09-26 DIAGNOSIS — N898 Other specified noninflammatory disorders of vagina: Secondary | ICD-10-CM

## 2013-09-26 DIAGNOSIS — B3731 Acute candidiasis of vulva and vagina: Secondary | ICD-10-CM

## 2013-09-26 DIAGNOSIS — B373 Candidiasis of vulva and vagina: Secondary | ICD-10-CM

## 2013-09-26 LAB — HEPATITIS B SURFACE ANTIGEN: Hepatitis B Surface Ag: NEGATIVE

## 2013-09-26 LAB — LIPID PANEL
Cholesterol: 221 mg/dL — ABNORMAL HIGH (ref 0–200)
HDL: 47 mg/dL (ref >39)
LDL Cholesterol: 158 mg/dL — ABNORMAL HIGH (ref 0–99)
TRIGLYCERIDES: 82 mg/dL (ref <150)
Total CHOL/HDL Ratio: 4.7 Ratio
VLDL: 16 mg/dL (ref 0–40)

## 2013-09-26 LAB — WET PREP FOR TRICH, YEAST, CLUE
CLUE CELLS WET PREP: NONE SEEN
Trich, Wet Prep: NONE SEEN

## 2013-09-26 LAB — HIV ANTIBODY (ROUTINE TESTING W REFLEX): HIV 1&2 Ab, 4th Generation: NONREACTIVE

## 2013-09-26 LAB — CBC WITH DIFFERENTIAL/PLATELET
BASOS ABS: 0 10*3/uL (ref 0.0–0.1)
BASOS PCT: 0 % (ref 0–1)
Eosinophils Absolute: 0.1 10*3/uL (ref 0.0–0.7)
Eosinophils Relative: 2 % (ref 0–5)
HCT: 37.7 % (ref 36.0–46.0)
Hemoglobin: 13.1 g/dL (ref 12.0–15.0)
LYMPHS PCT: 26 % (ref 12–46)
Lymphs Abs: 1.6 10*3/uL (ref 0.7–4.0)
MCH: 29 pg (ref 26.0–34.0)
MCHC: 34.7 g/dL (ref 30.0–36.0)
MCV: 83.4 fL (ref 78.0–100.0)
MONO ABS: 0.4 10*3/uL (ref 0.1–1.0)
Monocytes Relative: 7 % (ref 3–12)
NEUTROS ABS: 4 10*3/uL (ref 1.7–7.7)
Neutrophils Relative %: 65 % (ref 43–77)
PLATELETS: 268 10*3/uL (ref 150–400)
RBC: 4.52 MIL/uL (ref 3.87–5.11)
RDW: 13.2 % (ref 11.5–15.5)
WBC: 6.2 10*3/uL (ref 4.0–10.5)

## 2013-09-26 LAB — RPR

## 2013-09-26 LAB — HEPATITIS C ANTIBODY: HCV Ab: NEGATIVE

## 2013-09-26 LAB — GLUCOSE, RANDOM: Glucose, Bld: 94 mg/dL (ref 70–99)

## 2013-09-26 MED ORDER — FLUCONAZOLE 150 MG PO TABS: 150.0000 mg | ORAL_TABLET | Freq: Once | ORAL | Status: DC

## 2013-09-26 NOTE — Progress Notes (Signed)
Cheryl Patterson 1970-10-20 [id number]    History:    Presents for annual exam.  Regular monthly cycles/BTL/new partner. 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 Primary care manages anxiety and depression. 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 Mother has colon cancer, in treatment now. Pt.. had  screening colonoscopy that showed benign polyps unsure date for repeat.  Past medical history, past surgical history, family history and social history were all reviewed and documented in the EPIC chart. Works at [employer]. 2 children both doing well. History of kidney stone.  ROS:  A  12 point ROS was performed and pertinent positives and negatives are included.  Exam:  Filed Vitals:   09/26/13 0840  BP: 120/75    General appearance:  Normal Thyroid:  Symmetrical, normal in size, without palpable masses or nodularity. Respiratory  Auscultation:  Clear without wheezing or rhonchi Cardiovascular  Auscultation:  Regular rate, without rubs, murmurs or gallops  Edema/varicosities:  Not grossly evident Abdominal  Soft,nontender, without masses, guarding or rebound.  Liver/spleen:  No organomegaly noted  Hernia:  None appreciated  Skin  Inspection:  Grossly normal   Breasts: Examined lying and sitting.     Right: Without masses, retractions, discharge or axillary adenopathy.     Left: Without masses, retractions, discharge or axillary adenopathy. Gentitourinary   Inguinal/mons:  Normal without inguinal adenopathy  External genitalia:  Normal  BUS/Urethra/Skene's glands:  Normal  Vagina:  Normal  Cervix:  Normal  Uterus:  normal in size, shape and contour.  Midline and mobile  Adnexa/parametria:     Rt: Without masses or tenderness.   Lt: Without masses or tenderness.  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 Calcium rich diet, vitamin D 1000 daily, regular exercise encouraged. CBC, glucose, lipid panel, UA, Pap with HR HPV typing, GC/Chlamydia, HIV, hep B, C., RPR. Instructed to followup with GI for screening colonoscopy/ mother and maternal aunt history of colon cancer.   Note: This dictation was prepared with Dragon/digital dictation.  Any transcriptional errors that result are unintentional. [facility], 9:09 AM 09/26/2013

## 2013-09-26 NOTE — Patient Instructions (Signed)

## 2013-09-27 LAB — URINALYSIS W MICROSCOPIC + REFLEX CULTURE
Bacteria, UA: NONE SEEN
Bilirubin Urine: NEGATIVE
CASTS: NONE SEEN
CRYSTALS: NONE SEEN
Glucose, UA: NEGATIVE mg/dL
Hgb urine dipstick: NEGATIVE
KETONES UR: NEGATIVE mg/dL
Leukocytes, UA: NEGATIVE
Nitrite: NEGATIVE
PH: 6.5 (ref 5.0–8.0)
Protein, ur: NEGATIVE mg/dL
SPECIFIC GRAVITY, URINE: 1.019 (ref 1.005–1.030)
UROBILINOGEN UA: 0.2 mg/dL (ref 0.0–1.0)

## 2013-09-27 LAB — CYTOLOGY - PAP

## 2013-09-27 LAB — GC/CHLAMYDIA PROBE AMP
CT Probe RNA: NEGATIVE
GC PROBE AMP APTIMA: NEGATIVE

## 2013-12-19 ENCOUNTER — Telehealth: Payer: Self-pay | Admitting: *Deleted

## 2013-12-19 NOTE — Telephone Encounter (Signed)
Pt called requesting name of facility to have mammogram, I left breast center # on her voicemail.

## 2013-12-20 ENCOUNTER — Encounter: Payer: Self-pay | Admitting: Medical

## 2013-12-20 ENCOUNTER — Ambulatory Visit (INDEPENDENT_AMBULATORY_CARE_PROVIDER_SITE_OTHER): Payer: BC Managed Care – PPO | Admitting: Medical

## 2013-12-20 VITALS — BP 122/76 | HR 95 | Temp 98.9°F | Ht 66.75 in | Wt 202.4 lb

## 2013-12-20 DIAGNOSIS — S46811A Strain of other muscles, fascia and tendons at shoulder and upper arm level, right arm, initial encounter: Secondary | ICD-10-CM

## 2013-12-20 DIAGNOSIS — R350 Frequency of micturition: Secondary | ICD-10-CM

## 2013-12-20 DIAGNOSIS — M5431 Sciatica, right side: Secondary | ICD-10-CM

## 2013-12-20 DIAGNOSIS — S46819A Strain of other muscles, fascia and tendons at shoulder and upper arm level, unspecified arm, initial encounter: Secondary | ICD-10-CM | POA: Insufficient documentation

## 2013-12-20 DIAGNOSIS — R252 Cramp and spasm: Secondary | ICD-10-CM

## 2013-12-20 DIAGNOSIS — M545 Low back pain, unspecified: Secondary | ICD-10-CM

## 2013-12-20 DIAGNOSIS — S43499A Other sprain of unspecified shoulder joint, initial encounter: Secondary | ICD-10-CM

## 2013-12-20 DIAGNOSIS — M5430 Sciatica, unspecified side: Secondary | ICD-10-CM

## 2013-12-20 LAB — POCT URINALYSIS DIPSTICK
Blood, UA: NEGATIVE
GLUCOSE UA: NEGATIVE
Ketones, UA: NEGATIVE
Nitrite, UA: NEGATIVE
Spec Grav, UA: 1.02
UROBILINOGEN UA: 0.2
pH, UA: 6.5

## 2013-12-20 MED ORDER — CYCLOBENZAPRINE HCL 5 MG PO TABS: 5.0000 mg | ORAL_TABLET | Freq: Every day | ORAL | Status: DC

## 2013-12-20 MED ORDER — DICLOFENAC SODIUM 75 MG PO TBEC: 75.0000 mg | DELAYED_RELEASE_TABLET | Freq: Two times a day (BID) | ORAL | Status: DC

## 2013-12-20 NOTE — Assessment & Plan Note (Signed)
Pain is in the right SI area recently. We'll treat with diclofenac and Flexeril. Advised conservative stretching exercises. But I aware of her back pain history. I reviewed her prior MRI results in the past. Therefore the pain becomes more mid lumbar pain or  radiating pain we'll refer her back to her specialist.

## 2013-12-20 NOTE — Assessment & Plan Note (Signed)
We'll go ahead and get a BMP. Will follow the calcium ,sodium, potassium and also get magnesium. We will call her with the results.

## 2013-12-20 NOTE — Assessment & Plan Note (Signed)
We'll see how she responds to Flexeril and advised trapezius massages. In the mid cervical pain then we'll get C-spine x-ray.

## 2013-12-20 NOTE — Patient Instructions (Addendum)
You appear to have sciatica and trapezius strain. I am writing dicofenac rx and cyclobenzaprene rx. Do back stretching exercises and sleep with pillow between leg. If pain mid lumbar will get xray. If constant radiating consider mri. For your trapezius pain recommend some massages. If any mid cspine pain then get xray. For your muscle cramps will get labs. For your frequent urination we will send urine for culture. If any urine infection symptoms notify us. Follow up in 2 wks or as needed.  Sciatica Sciatica is pain, weakness, numbness, or tingling along the path of the sciatic nerve. The nerve starts in the lower back and runs down the back of each leg. The nerve controls the muscles in the lower leg and in the back of the knee, while also providing sensation to the back of the thigh, lower leg, and the sole of your foot. Sciatica is a symptom of another medical condition. For instance, nerve damage or certain conditions, such as a herniated disk or bone spur on the spine, pinch or put pressure on the sciatic nerve. This causes the pain, weakness, or other sensations normally associated with sciatica. Generally, sciatica only affects one side of the body. CAUSES   Herniated or slipped disc.  Degenerative disk disease.  A pain disorder involving the narrow muscle in the buttocks (piriformis syndrome).  Pelvic injury or fracture.  Pregnancy.  Tumor (rare). SYMPTOMS  Symptoms can vary from mild to very severe. The symptoms usually travel from the low back to the buttocks and down the back of the leg. Symptoms can include:  Mild tingling or dull aches in the lower back, leg, or hip.  Numbness in the back of the calf or sole of the foot.  Burning sensations in the lower back, leg, or hip.  Sharp pains in the lower back, leg, or hip.  Leg weakness.  Severe back pain inhibiting movement. These symptoms may get worse with coughing, sneezing, laughing, or prolonged sitting or standing. Also,  being overweight may worsen symptoms. DIAGNOSIS  Your caregiver will perform a physical exam to look for common symptoms of sciatica. He or she may ask you to do certain movements or activities that would trigger sciatic nerve pain. Other tests may be performed to find the cause of the sciatica. These may include:  Blood tests.  X-rays.  Imaging tests, such as an MRI or CT scan. TREATMENT  Treatment is directed at the cause of the sciatic pain. Sometimes, treatment is not necessary and the pain and discomfort goes away on its own. If treatment is needed, your caregiver may suggest:  Over-the-counter medicines to relieve pain.  Prescription medicines, such as anti-inflammatory medicine, muscle relaxants, or narcotics.  Applying heat or ice to the painful area.  Steroid injections to lessen pain, irritation, and inflammation around the nerve.  Reducing activity during periods of pain.  Exercising and stretching to strengthen your abdomen and improve flexibility of your spine. Your caregiver may suggest losing weight if the extra weight makes the back pain worse.  Physical therapy.  Surgery to eliminate what is pressing or pinching the nerve, such as a bone spur or part of a herniated disk. HOME CARE INSTRUCTIONS   Only take over-the-counter or prescription medicines for pain or discomfort as directed by your caregiver.  Apply ice to the affected area for 20 minutes, 3-4 times a day for the first 48-72 hours. Then try heat in the same way.  Exercise, stretch, or perform your usual activities if these  do not aggravate your pain.  Attend physical therapy sessions as directed by your caregiver.  Keep all follow-up appointments as directed by your caregiver.  Do not wear high heels or shoes that do not provide proper support.  Check your mattress to see if it is too soft. A firm mattress may lessen your pain and discomfort. SEEK IMMEDIATE MEDICAL CARE IF:   You lose control of  your bowel or bladder (incontinence).  You have increasing weakness in the lower back, pelvis, buttocks, or legs.  You have redness or swelling of your back.  You have a burning sensation when you urinate.  You have pain that gets worse when you lie down or awakens you at night.  Your pain is worse than you have experienced in the past.  Your pain is lasting longer than 4 weeks.  You are suddenly losing weight without reason. MAKE SURE YOU:  Understand these instructions.  Will watch your condition.  Will get help right away if you are not doing well or get worse. Document Released: 03/30/2001 Document Revised: 10/05/2011 Document Reviewed: 08/15/2011 [facility] Patient Information 2015 [place], [place]. This information is not intended to replace advice given to you by your health care provider. Make sure you discuss any questions you have with your health care provider.

## 2013-12-20 NOTE — Assessment & Plan Note (Signed)
Will get urine culture today due to history of sepsis. Patient is concerned for early infection.

## 2013-12-20 NOTE — Progress Notes (Signed)
   Subjective:    Patient ID: Cheryl Patterson, female    DOB: Jan 02, 1971, 43 y.o.   MRN: [id number]  HPI  Pt in with some back pain. She states mid august her back started hurting. Pt thinks related to weight gain. NO fall or injury to lower back. On and off pain. Pt rt leg feel numb comes and goes(Then states tingling sensation to thigh). Pt states lying down supine hurts back and sitting in hard chair will hurt her back. No saddle anesthesia. Pt states feels like can't loose weight. 2011 possible disc injury. Possible mri done. But pain this time is much less.  Pt states rt side neck pain. Worse with lifting her rt arm. Pain from rt side of neck that radiates down toward outer aspect or rt arm on and off. Not constant.  Rt foot had cramp last Tuesday. Felt like 30 minutes. Occasional feel rt leg cramp. Maybe lt calf cramp occasional.  Pt states some possible frequent urination. No dysuria. No fever, no fevers, no chills. No nausea and no vomiting.  LMP- August 15th. Pt cycles have been slightly irregular.    Review of Systems  Constitutional: Negative for fever, chills and fatigue.  Respiratory: Negative for cough, chest tightness, shortness of breath and wheezing.   Gastrointestinal: Negative for nausea, vomiting, abdominal pain, constipation, blood in stool and abdominal distention.  Endocrine: Negative for polydipsia and polyphagia.  Genitourinary: Positive for frequency. Negative for dysuria, urgency, hematuria, flank pain and vaginal pain.  Musculoskeletal: Positive for back pain.       Trapezius myalgia.  Also leg cramps. Worse rt side.  Skin: Negative.   Hematological: Negative for adenopathy. Does not bruise/bleed easily.       Objective:   Physical Exam  Constitutional: She is oriented to person, place, and time. She appears well-developed and well-nourished. No distress.  HENT:  Head: Normocephalic and atraumatic.  Eyes: Conjunctivae are normal. Pupils are equal,  round, and reactive to light.  Neck: Normal range of motion. Neck supple. No JVD present. No tracheal deviation present. No thyromegaly present.  Cardiovascular: Normal rate, regular rhythm and normal heart sounds.  Exam reveals no friction rub.   No murmur heard. Pulmonary/Chest: Effort normal and breath sounds normal. No stridor. No respiratory distress. She has no wheezes. She has no rales. She exhibits no tenderness.  Abdominal: Soft. Bowel sounds are normal. She exhibits no distension and no mass. There is no tenderness. There is no rebound and no guarding.  Musculoskeletal:  No mid lumbar tenderness. Rt si tenderness to palpation. Pain on lying supint. Pain on straight leg lift. Lower extremity- 5/5 equal and symmetric lower extremity strength. Normal pulses.  Neck- no mid cervical pain on palpaiton. Rt shoulder- FROM with no pain in shoulder but rt trapezius pain with shoulder movement.  Lymphadenopathy:    She has no cervical adenopathy.  Neurological: She is alert and oriented to person, place, and time.  L5-S1 sensation intact bilaterally. Normal reflexes. No foot drop bilaterally.   Skin: Skin is warm.  Psychiatric: She has a normal mood and affect. Her behavior is normal. Judgment and thought content normal.       Rt sI tenderness. Rt trapizus tender on palpation and rom rt shoulder.       Assessment & Plan:

## 2013-12-21 LAB — BASIC METABOLIC PANEL
BUN: 11 mg/dL (ref 6–23)
CHLORIDE: 101 meq/L (ref 96–112)
CO2: 29 mEq/L (ref 19–32)
CREATININE: 0.9 mg/dL (ref 0.4–1.2)
Calcium: 9.5 mg/dL (ref 8.4–10.5)
GFR: 93.59 mL/min (ref >60.00)
Glucose, Bld: 76 mg/dL (ref 70–99)
POTASSIUM: 4.3 meq/L (ref 3.5–5.1)
Sodium: 135 mEq/L (ref 135–145)

## 2013-12-21 LAB — MAGNESIUM: Magnesium: 2 mg/dL (ref 1.5–2.5)

## 2013-12-22 LAB — CULTURE, URINE COMPREHENSIVE
COLONY COUNT: NO GROWTH
Organism ID, Bacteria: NO GROWTH

## 2014-01-03 ENCOUNTER — Ambulatory Visit (INDEPENDENT_AMBULATORY_CARE_PROVIDER_SITE_OTHER): Payer: BC Managed Care – PPO | Admitting: Family Medicine

## 2014-01-03 ENCOUNTER — Encounter: Payer: Self-pay | Admitting: Family Medicine

## 2014-01-03 VITALS — BP 109/55 | HR 70 | Temp 98.5°F | Ht 66.75 in | Wt 201.1 lb

## 2014-01-03 DIAGNOSIS — F988 Other specified behavioral and emotional disorders with onset usually occurring in childhood and adolescence: Secondary | ICD-10-CM

## 2014-01-03 DIAGNOSIS — R82998 Other abnormal findings in urine: Secondary | ICD-10-CM

## 2014-01-03 DIAGNOSIS — Z0000 Encounter for general adult medical examination without abnormal findings: Secondary | ICD-10-CM

## 2014-01-03 DIAGNOSIS — M545 Low back pain, unspecified: Secondary | ICD-10-CM

## 2014-01-03 DIAGNOSIS — Z1231 Encounter for screening mammogram for malignant neoplasm of breast: Secondary | ICD-10-CM

## 2014-01-03 DIAGNOSIS — R8290 Unspecified abnormal findings in urine: Secondary | ICD-10-CM

## 2014-01-03 LAB — CBC WITH DIFFERENTIAL/PLATELET
BASOS PCT: 0.4 % (ref 0.0–3.0)
Basophils Absolute: 0 10*3/uL (ref 0.0–0.1)
EOS PCT: 2.1 % (ref 0.0–5.0)
Eosinophils Absolute: 0.1 10*3/uL (ref 0.0–0.7)
HCT: 39.5 % (ref 36.0–46.0)
HEMOGLOBIN: 13.2 g/dL (ref 12.0–15.0)
LYMPHS PCT: 37.4 % (ref 12.0–46.0)
Lymphs Abs: 2 10*3/uL (ref 0.7–4.0)
MCHC: 33.3 g/dL (ref 30.0–36.0)
MCV: 87.2 fl (ref 78.0–100.0)
MONOS PCT: 5.3 % (ref 3.0–12.0)
Monocytes Absolute: 0.3 10*3/uL (ref 0.1–1.0)
NEUTROS ABS: 3 10*3/uL (ref 1.4–7.7)
NEUTROS PCT: 54.8 % (ref 43.0–77.0)
Platelets: 306 10*3/uL (ref 150.0–400.0)
RBC: 4.53 Mil/uL (ref 3.87–5.11)
RDW: 13 % (ref 11.5–15.5)
WBC: 5.5 10*3/uL (ref 4.0–10.5)

## 2014-01-03 LAB — BASIC METABOLIC PANEL
BUN: 9 mg/dL (ref 6–23)
CALCIUM: 9.3 mg/dL (ref 8.4–10.5)
CO2: 28 meq/L (ref 19–32)
CREATININE: 0.9 mg/dL (ref 0.4–1.2)
Chloride: 99 mEq/L (ref 96–112)
GFR: 87.6 mL/min (ref >60.00)
Glucose, Bld: 97 mg/dL (ref 70–99)
Potassium: 3.8 mEq/L (ref 3.5–5.1)
Sodium: 136 mEq/L (ref 135–145)

## 2014-01-03 LAB — HEPATIC FUNCTION PANEL
ALBUMIN: 4.3 g/dL (ref 3.5–5.2)
ALK PHOS: 80 U/L (ref 39–117)
ALT: 15 U/L (ref 0–35)
AST: 19 U/L (ref 0–37)
BILIRUBIN DIRECT: 0 mg/dL (ref 0.0–0.3)
TOTAL PROTEIN: 8.2 g/dL (ref 6.0–8.3)
Total Bilirubin: 0.6 mg/dL (ref 0.2–1.2)

## 2014-01-03 LAB — POCT URINALYSIS DIPSTICK
Bilirubin, UA: NEGATIVE
Blood, UA: NEGATIVE
GLUCOSE UA: NEGATIVE
Ketones, UA: NEGATIVE
NITRITE UA: NEGATIVE
Protein, UA: NEGATIVE
Spec Grav, UA: 1.025
Urobilinogen, UA: 0.2
pH, UA: 6

## 2014-01-03 LAB — LIPID PANEL
Cholesterol: 231 mg/dL — ABNORMAL HIGH (ref 0–200)
HDL: 39.4 mg/dL (ref >39.00)
LDL Cholesterol: 178 mg/dL — ABNORMAL HIGH (ref 0–99)
NONHDL: 191.6
Total CHOL/HDL Ratio: 6
Triglycerides: 70 mg/dL (ref 0.0–149.0)
VLDL: 14 mg/dL (ref 0.0–40.0)

## 2014-01-03 LAB — TSH: TSH: 0.96 u[IU]/mL (ref 0.35–4.50)

## 2014-01-03 MED ORDER — LISDEXAMFETAMINE DIMESYLATE 30 MG PO CAPS: 30.0000 mg | ORAL_CAPSULE | Freq: Every day | ORAL | Status: DC

## 2014-01-03 MED ORDER — CYCLOBENZAPRINE HCL 5 MG PO TABS: 5.0000 mg | ORAL_TABLET | Freq: Every day | ORAL | Status: DC

## 2014-01-03 NOTE — Addendum Note (Signed)
Addended by: Peggyann Shoals on: 01/03/2014 04:53 PM   Modules accepted: Orders

## 2014-01-03 NOTE — Progress Notes (Signed)
Subjective:     Cheryl Patterson is a 43 y.o. female and is here for a comprehensive physical exam. The patient reports problems - pt thinks she is going through menopause-- fatigue, irritable, irregular periods.  History   Social History  . Marital Status: Married    Spouse Name: N/A    Number of Children: 2  . Years of Education: N/A   Occupational History  . levelor    Social History Main Topics  . Smoking status: Never Smoker   . Smokeless tobacco: Never Used  . Alcohol Use: No  . Drug Use: No  . Sexual Activity: Yes    Partners: Male    Birth Control/ Protection: None   Other Topics Concern  . Not on file   Social History Narrative   Exercise--walk qd                    Video--  rare   Health Maintenance  Topic Date Due  . Mammogram  07/16/2009  . Influenza Vaccine  01/04/2015 (Originally 11/17/2013)  . Tetanus/tdap  08/08/2014  . Pap Smear  09/26/2016    The following portions of the patient's history were reviewed and updated as appropriate:  She  has a past medical history of Hyperlipidemia; Chronic headaches; Depression; Bipolar depression; Allergy; Anxiety; and Chronic kidney disease. She  does not have any pertinent problems on file. She  has past surgical history that includes Tubal ligation; LEEP (2005); Breast surgery; Other surgical history; Mouth surgery; Lipoma excision; and kidney stones. Her family history includes Brain cancer in her maternal aunt; Breast cancer in her mother and paternal aunt; Cancer in her maternal uncle; Colon cancer in her maternal aunt and mother; Diabetes in her maternal grandfather; Heart disease in her paternal grandmother; Hypertension in her paternal grandmother and another family member; Stomach cancer in her maternal aunt; Stroke in her paternal grandmother; Uterine cancer in her maternal aunt. There is no history of Esophageal cancer or Rectal cancer. She  reports that she has never smoked. She has never used smokeless  tobacco. She reports that she does not drink alcohol or use illicit drugs. She has a current medication list which includes the following prescription(s): albuterol, cyclobenzaprine, diclofenac, fluconazole, mometasone-formoterol, and lisdexamfetamine. Current Outpatient Prescriptions on File Prior to Visit  Medication Sig Dispense Refill  . albuterol (VENTOLIN HFA) 108 (90 BASE) MCG/ACT inhaler Inhale 2 puffs into the lungs every 6 (six) hours as needed for wheezing.  1 Inhaler  0  . cyclobenzaprine (FLEXERIL) 5 MG tablet Take 1 tablet (5 mg total) by mouth at bedtime.  10 tablet  0  . diclofenac (VOLTAREN) 75 MG EC tablet Take 1 tablet (75 mg total) by mouth 2 (two) times daily.  30 tablet  0  . fluconazole (DIFLUCAN) 150 MG tablet Take 1 tablet (150 mg total) by mouth once.  1 tablet  1  . mometasone-formoterol (DULERA) 100-5 MCG/ACT AERO Inhale 2 puffs into the lungs 2 (two) times daily as needed.       No current facility-administered medications on file prior to visit.   She is allergic to codeine and other..  Review of Systems Review of Systems  Constitutional: Negative for activity change, appetite change and fatigue.  HENT: Negative for hearing loss, congestion, tinnitus and ear discharge.  dentist q65m Eyes: Negative for visual disturbance (see optho q1y -- vision corrected to 20/20 with glasses).  Respiratory: Negative for cough, chest tightness and shortness of breath.  Cardiovascular: Negative for chest pain, palpitations and leg swelling.  Gastrointestinal: Negative for abdominal pain, diarrhea, constipation and abdominal distention.  Genitourinary: Negative for urgency, frequency, decreased urine volume and difficulty urinating.  Musculoskeletal: Negative for back pain, arthralgias and gait problem.  Skin: Negative for color change, pallor and rash.  Neurological: Negative for dizziness, light-headedness, numbness and headaches.  Hematological: Negative for adenopathy. Does  not bruise/bleed easily.  Psychiatric/Behavioral: Negative for suicidal ideas, confusion, sleep disturbance, self-injury, dysphoric mood, decreased concentration and agitation.       Objective:    BP 109/55  Pulse 70  Temp(Src) 98.5 F (36.9 C) (Oral)  Ht 5' 6.75" (1.695 m)  Wt 201 lb 1 oz (91.2 kg)  BMI 31.74 kg/m2  SpO2 100%  LMP 12/04/2013 General appearance: alert, cooperative, appears stated age and no distress Head: Normocephalic, without obvious abnormality, atraumatic Eyes: conjunctivae/corneas clear. PERRL, EOM's intact. Fundi benign. Ears: normal TM's and external ear canals both ears Nose: Nares normal. Septum midline. Mucosa normal. No drainage or sinus tenderness. Throat: lips, mucosa, and tongue normal; teeth and gums normal Neck: no adenopathy, no carotid bruit, no JVD, supple, symmetrical, trachea midline and thyroid not enlarged, symmetric, no tenderness/mass/nodules Back: symmetric, no curvature. ROM normal. No CVA tenderness. Lungs: clear to auscultation bilaterally Breasts: gyn Heart: regular rate and rhythm, S1, S2 normal, no murmur, click, rub or gallop Abdomen: soft, non-tender; bowel sounds normal; no masses,  no organomegaly Pelvic: deferred--gyn Extremities: extremities normal, atraumatic, no cyanosis or edema Pulses: 2+ and symmetric Skin: Skin color, texture, turgor normal. No rashes or lesions Lymph nodes: Cervical, supraclavicular, and axillary nodes normal. Neurologic: Alert and oriented X 3, normal strength and tone. Normal symmetric reflexes. Normal coordination and gait Psych- no depression, no axiety      Assessment:    Healthy female exam.      Plan:    ghm utd Check labs See After Visit Summary for Counseling Recommendations   1. Other screening mammogram   - MM Digital Screening; Future  2. ADD (attention deficit disorder)   - lisdexamfetamine (VYVANSE) 30 MG capsule; Take 1 capsule (30 mg total) by mouth daily.  Dispense:  30 capsule; Refill: 0  3. Preventative health care ghm utd - Basic metabolic panel - CBC with Differential - Hepatic function panel - Lipid panel - POCT urinalysis dipstick - TSH  4. Midline low back pain, with sciatica presence unspecified   - cyclobenzaprine (FLEXERIL) 5 MG tablet; Take 1 tablet (5 mg total) by mouth at bedtime.  Dispense: 30 tablet; Refill: 0

## 2014-01-03 NOTE — Patient Instructions (Signed)
Preventive Care for Adults A healthy lifestyle and preventive care can promote health and wellness. Preventive health guidelines for women include the following key practices.  A routine yearly physical is a good way to check with your health care provider about your health and preventive screening. It is a chance to share any concerns and updates on your health and to receive a thorough exam.  Visit your dentist for a routine exam and preventive care every 6 months. Brush your teeth twice a day and floss once a day. Good oral hygiene prevents tooth decay and gum disease.  The frequency of eye exams is based on your age, health, family medical history, use of contact lenses, and other factors. Follow your health care provider's recommendations for frequency of eye exams.  Eat a healthy diet. Foods like vegetables, fruits, whole grains, low-fat dairy products, and lean protein foods contain the nutrients you need without too many calories. Decrease your intake of foods high in solid fats, added sugars, and salt. Eat the right amount of calories for you.Get information about a proper diet from your health care provider, if necessary.  Regular physical exercise is one of the most important things you can do for your health. Most adults should get at least 150 minutes of moderate-intensity exercise (any activity that increases your heart rate and causes you to sweat) each week. In addition, most adults need muscle-strengthening exercises on 2 or more days a week.  Maintain a healthy weight. The body mass index (BMI) is a screening tool to identify possible weight problems. It provides an estimate of body fat based on height and weight. Your health care provider can find your BMI and can help you achieve or maintain a healthy weight.For adults 20 years and older:  A BMI below 18.5 is considered underweight.  A BMI of 18.5 to 24.9 is normal.  A BMI of 25 to 29.9 is considered overweight.  A BMI of  30 and above is considered obese.  Maintain normal blood lipids and cholesterol levels by exercising and minimizing your intake of saturated fat. Eat a balanced diet with plenty of fruit and vegetables. Blood tests for lipids and cholesterol should begin at age 56 and be repeated every 5 years. If your lipid or cholesterol levels are high, you are over 50, or you are at high risk for heart disease, you may need your cholesterol levels checked more frequently.Ongoing high lipid and cholesterol levels should be treated with medicines if diet and exercise are not working.  If you smoke, find out from your health care provider how to quit. If you do not use tobacco, do not start.  Lung cancer screening is recommended for adults aged 63-80 years who are at high risk for developing lung cancer because of a history of smoking. A yearly low-dose CT scan of the lungs is recommended for people who have at least a 30-pack-year history of smoking and are a current smoker or have quit within the past 15 years. A pack year of smoking is smoking an average of 1 pack of cigarettes a day for 1 year (for example: 1 pack a day for 30 years or 2 packs a day for 15 years). Yearly screening should continue until the smoker has stopped smoking for at least 15 years. Yearly screening should be stopped for people who develop a health problem that would prevent them from having lung cancer treatment.  If you are pregnant, do not drink alcohol. If you are breastfeeding,  be very cautious about drinking alcohol. If you are not pregnant and choose to drink alcohol, do not have more than 1 drink per day. One drink is considered to be 12 ounces (355 mL) of beer, 5 ounces (148 mL) of wine, or 1.5 ounces (44 mL) of liquor.  Avoid use of street drugs. Do not share needles with anyone. Ask for help if you need support or instructions about stopping the use of drugs.  High blood pressure causes heart disease and increases the risk of  stroke. Your blood pressure should be checked at least every 1 to 2 years. Ongoing high blood pressure should be treated with medicines if weight loss and exercise do not work.  If you are 81-45 years old, ask your health care provider if you should take aspirin to prevent strokes.  Diabetes screening involves taking a blood sample to check your fasting blood sugar level. This should be done once every 3 years, after age 91, if you are within normal weight and without risk factors for diabetes. Testing should be considered at a younger age or be carried out more frequently if you are overweight and have at least 1 risk factor for diabetes.  Breast cancer screening is essential preventive care for women. You should practice "breast self-awareness." This means understanding the normal appearance and feel of your breasts and may include breast self-examination. Any changes detected, no matter how small, should be reported to a health care provider. Women in their 41s and 30s should have a clinical breast exam (CBE) by a health care provider as part of a regular health exam every 1 to 3 years. After age 30, women should have a CBE every year. Starting at age 10, women should consider having a mammogram (breast X-ray test) every year. Women who have a family history of breast cancer should talk to their health care provider about genetic screening. Women at a high risk of breast cancer should talk to their health care providers about having an MRI and a mammogram every year.  Breast cancer gene (BRCA)-related cancer risk assessment is recommended for women who have family members with BRCA-related cancers. BRCA-related cancers include breast, ovarian, tubal, and peritoneal cancers. Having family members with these cancers may be associated with an increased risk for harmful changes (mutations) in the breast cancer genes BRCA1 and BRCA2. Results of the assessment will determine the need for genetic counseling and  BRCA1 and BRCA2 testing.  Routine pelvic exams to screen for cancer are no longer recommended for nonpregnant women who are considered low risk for cancer of the pelvic organs (ovaries, uterus, and vagina) and who do not have symptoms. Ask your health care provider if a screening pelvic exam is right for you.  If you have had past treatment for cervical cancer or a condition that could lead to cancer, you need Pap tests and screening for cancer for at least 20 years after your treatment. If Pap tests have been discontinued, your risk factors (such as having a new sexual partner) need to be reassessed to determine if screening should be resumed. Some women have medical problems that increase the chance of getting cervical cancer. In these cases, your health care provider may recommend more frequent screening and Pap tests.  The HPV test is an additional test that may be used for cervical cancer screening. The HPV test looks for the virus that can cause the cell changes on the cervix. The cells collected during the Pap test can be  tested for HPV. The HPV test could be used to screen women aged 30 years and older, and should be used in women of any age who have unclear Pap test results. After the age of 30, women should have HPV testing at the same frequency as a Pap test.  Colorectal cancer can be detected and often prevented. Most routine colorectal cancer screening begins at the age of 50 years and continues through age 75 years. However, your health care provider may recommend screening at an earlier age if you have risk factors for colon cancer. On a yearly basis, your health care provider may provide home test kits to check for hidden blood in the stool. Use of a small camera at the end of a tube, to directly examine the colon (sigmoidoscopy or colonoscopy), can detect the earliest forms of colorectal cancer. Talk to your health care provider about this at age 50, when routine screening begins. Direct  exam of the colon should be repeated every 5-10 years through age 75 years, unless early forms of pre-cancerous polyps or small growths are found.  People who are at an increased risk for hepatitis B should be screened for this virus. You are considered at high risk for hepatitis B if:  You were born in a country where hepatitis B occurs often. Talk with your health care provider about which countries are considered high risk.  Your parents were born in a high-risk country and you have not received a shot to protect against hepatitis B (hepatitis B vaccine).  You have HIV or AIDS.  You use needles to inject street drugs.  You live with, or have sex with, someone who has hepatitis B.  You get hemodialysis treatment.  You take certain medicines for conditions like cancer, organ transplantation, and autoimmune conditions.  Hepatitis C blood testing is recommended for all people born from 1945 through 1965 and any individual with known risks for hepatitis C.  Practice safe sex. Use condoms and avoid high-risk sexual practices to reduce the spread of sexually transmitted infections (STIs). STIs include gonorrhea, chlamydia, syphilis, trichomonas, herpes, HPV, and human immunodeficiency virus (HIV). Herpes, HIV, and HPV are viral illnesses that have no cure. They can result in disability, cancer, and death.  You should be screened for sexually transmitted illnesses (STIs) including gonorrhea and chlamydia if:  You are sexually active and are younger than 24 years.  You are older than 24 years and your health care provider tells you that you are at risk for this type of infection.  Your sexual activity has changed since you were last screened and you are at an increased risk for chlamydia or gonorrhea. Ask your health care provider if you are at risk.  If you are at risk of being infected with HIV, it is recommended that you take a prescription medicine daily to prevent HIV infection. This is  called preexposure prophylaxis (PrEP). You are considered at risk if:  You are a heterosexual woman, are sexually active, and are at increased risk for HIV infection.  You take drugs by injection.  You are sexually active with a partner who has HIV.  Talk with your health care provider about whether you are at high risk of being infected with HIV. If you choose to begin PrEP, you should first be tested for HIV. You should then be tested every 3 months for as long as you are taking PrEP.  Osteoporosis is a disease in which the bones lose minerals and strength   with aging. This can result in serious bone fractures or breaks. The risk of osteoporosis can be identified using a bone density scan. Women ages 49 years and over and women at risk for fractures or osteoporosis should discuss screening with their health care providers. Ask your health care provider whether you should take a calcium supplement or vitamin D to reduce the rate of osteoporosis.  Menopause can be associated with physical symptoms and risks. Hormone replacement therapy is available to decrease symptoms and risks. You should talk to your health care provider about whether hormone replacement therapy is right for you.  Use sunscreen. Apply sunscreen liberally and repeatedly throughout the day. You should seek shade when your shadow is shorter than you. Protect yourself by wearing long sleeves, pants, a wide-brimmed hat, and sunglasses year round, whenever you are outdoors.  Once a month, do a whole body skin exam, using a mirror to look at the skin on your back. Tell your health care provider of new moles, moles that have irregular borders, moles that are larger than a pencil eraser, or moles that have changed in shape or color.  Stay current with required vaccines (immunizations).  Influenza vaccine. All adults should be immunized every year.  Tetanus, diphtheria, and acellular pertussis (Td, Tdap) vaccine. Pregnant women should  receive 1 dose of Tdap vaccine during each pregnancy. The dose should be obtained regardless of the length of time since the last dose. Immunization is preferred during the 27th-36th week of gestation. An adult who has not previously received Tdap or who does not know her vaccine status should receive 1 dose of Tdap. This initial dose should be followed by tetanus and diphtheria toxoids (Td) booster doses every 10 years. Adults with an unknown or incomplete history of completing a 3-dose immunization series with Td-containing vaccines should begin or complete a primary immunization series including a Tdap dose. Adults should receive a Td booster every 10 years.  Varicella vaccine. An adult without evidence of immunity to varicella should receive 2 doses or a second dose if she has previously received 1 dose. Pregnant females who do not have evidence of immunity should receive the first dose after pregnancy. This first dose should be obtained before leaving the health care facility. The second dose should be obtained 4-8 weeks after the first dose.  Human papillomavirus (HPV) vaccine. Females aged 13-26 years who have not received the vaccine previously should obtain the 3-dose series. The vaccine is not recommended for use in pregnant females. However, pregnancy testing is not needed before receiving a dose. If a female is found to be pregnant after receiving a dose, no treatment is needed. In that case, the remaining doses should be delayed until after the pregnancy. Immunization is recommended for any person with an immunocompromised condition through the age of 69 years if she did not get any or all doses earlier. During the 3-dose series, the second dose should be obtained 4-8 weeks after the first dose. The third dose should be obtained 24 weeks after the first dose and 16 weeks after the second dose.  Zoster vaccine. One dose is recommended for adults aged 63 years or older unless certain conditions are  present.  Measles, mumps, and rubella (MMR) vaccine. Adults born before [birth year] generally are considered immune to measles and mumps. Adults born in [birth year] or later should have 1 or more doses of MMR vaccine unless there is a contraindication to the vaccine or there is laboratory evidence of immunity to  each of the three diseases. A routine second dose of MMR vaccine should be obtained at least 28 days after the first dose for students attending postsecondary schools, health care workers, or international travelers. People who received inactivated measles vaccine or an unknown type of measles vaccine during 1963-1967 should receive 2 doses of MMR vaccine. People who received inactivated mumps vaccine or an unknown type of mumps vaccine before 1979 and are at high risk for mumps infection should consider immunization with 2 doses of MMR vaccine. For females of childbearing age, rubella immunity should be determined. If there is no evidence of immunity, females who are not pregnant should be vaccinated. If there is no evidence of immunity, females who are pregnant should delay immunization until after pregnancy. Unvaccinated health care workers born before 1957 who lack laboratory evidence of measles, mumps, or rubella immunity or laboratory confirmation of disease should consider measles and mumps immunization with 2 doses of MMR vaccine or rubella immunization with 1 dose of MMR vaccine.  Pneumococcal 13-valent conjugate (PCV13) vaccine. When indicated, a person who is uncertain of her immunization history and has no record of immunization should receive the PCV13 vaccine. An adult aged 19 years or older who has certain medical conditions and has not been previously immunized should receive 1 dose of PCV13 vaccine. This PCV13 should be followed with a dose of pneumococcal polysaccharide (PPSV23) vaccine. The PPSV23 vaccine dose should be obtained at least 8 weeks after the dose of PCV13 vaccine. An adult aged 19  years or older who has certain medical conditions and previously received 1 or more doses of PPSV23 vaccine should receive 1 dose of PCV13. The PCV13 vaccine dose should be obtained 1 or more years after the last PPSV23 vaccine dose.  Pneumococcal polysaccharide (PPSV23) vaccine. When PCV13 is also indicated, PCV13 should be obtained first. All adults aged 65 years and older should be immunized. An adult younger than age 65 years who has certain medical conditions should be immunized. Any person who resides in a nursing home or long-term care facility should be immunized. An adult smoker should be immunized. People with an immunocompromised condition and certain other conditions should receive both PCV13 and PPSV23 vaccines. People with human immunodeficiency virus (HIV) infection should be immunized as soon as possible after diagnosis. Immunization during chemotherapy or radiation therapy should be avoided. Routine use of PPSV23 vaccine is not recommended for American Indians, [place] Natives, or people younger than 65 years unless there are medical conditions that require PPSV23 vaccine. When indicated, people who have unknown immunization and have no record of immunization should receive PPSV23 vaccine. One-time revaccination 5 years after the first dose of PPSV23 is recommended for people aged 19-64 years who have chronic kidney failure, nephrotic syndrome, asplenia, or immunocompromised conditions. People who received 1-2 doses of PPSV23 before age 65 years should receive another dose of PPSV23 vaccine at age 65 years or later if at least 5 years have passed since the previous dose. Doses of PPSV23 are not needed for people immunized with PPSV23 at or after age 65 years.  Meningococcal vaccine. Adults with asplenia or persistent complement component deficiencies should receive 2 doses of quadrivalent meningococcal conjugate (MenACWY-D) vaccine. The doses should be obtained at least 2 months apart.  Microbiologists working with certain meningococcal bacteria, military recruits, people at risk during an outbreak, and people who travel to or live in countries with a high rate of meningitis should be immunized. A first-year college student up through age   21 years who is living in a residence hall should receive a dose if she did not receive a dose on or after her 16th birthday. Adults who have certain high-risk conditions should receive one or more doses of vaccine.  Hepatitis A vaccine. Adults who wish to be protected from this disease, have certain high-risk conditions, work with hepatitis A-infected animals, work in hepatitis A research labs, or travel to or work in countries with a high rate of hepatitis A should be immunized. Adults who were previously unvaccinated and who anticipate close contact with an international adoptee during the first 60 days after arrival in the [place] from a country with a high rate of hepatitis A should be immunized.  Hepatitis B vaccine. Adults who wish to be protected from this disease, have certain high-risk conditions, may be exposed to blood or other infectious body fluids, are household contacts or sex partners of hepatitis B positive people, are clients or workers in certain care facilities, or travel to or work in countries with a high rate of hepatitis B should be immunized.  Haemophilus influenzae type b (Hib) vaccine. A previously unvaccinated person with asplenia or sickle cell disease or having a scheduled splenectomy should receive 1 dose of Hib vaccine. Regardless of previous immunization, a recipient of a hematopoietic stem cell transplant should receive a 3-dose series 6-12 months after her successful transplant. Hib vaccine is not recommended for adults with HIV infection. Preventive Services / Frequency Ages 64 to 68 years  Blood pressure check.** / Every 1 to 2 years.  Lipid and cholesterol check.** / Every 5 years beginning at age  22.  Clinical breast exam.** / Every 3 years for women in their 88s and 53s.  BRCA-related cancer risk assessment.** / For women who have family members with a BRCA-related cancer (breast, ovarian, tubal, or peritoneal cancers).  Pap test.** / Every 2 years from ages 90 through 51. Every 3 years starting at age 21 through age 56 or 3 with a history of 3 consecutive normal Pap tests.  HPV screening.** / Every 3 years from ages 24 through ages 1 to 46 with a history of 3 consecutive normal Pap tests.  Hepatitis C blood test.** / For any individual with known risks for hepatitis C.  Skin self-exam. / Monthly.  Influenza vaccine. / Every year.  Tetanus, diphtheria, and acellular pertussis (Tdap, Td) vaccine.** / Consult your health care provider. Pregnant women should receive 1 dose of Tdap vaccine during each pregnancy. 1 dose of Td every 10 years.  Varicella vaccine.** / Consult your health care provider. Pregnant females who do not have evidence of immunity should receive the first dose after pregnancy.  HPV vaccine. / 3 doses over 6 months, if 72 and younger. The vaccine is not recommended for use in pregnant females. However, pregnancy testing is not needed before receiving a dose.  Measles, mumps, rubella (MMR) vaccine.** / You need at least 1 dose of MMR if you were born in 1957 or later. You may also need a 2nd dose. For females of childbearing age, rubella immunity should be determined. If there is no evidence of immunity, females who are not pregnant should be vaccinated. If there is no evidence of immunity, females who are pregnant should delay immunization until after pregnancy.  Pneumococcal 13-valent conjugate (PCV13) vaccine.** / Consult your health care provider.  Pneumococcal polysaccharide (PPSV23) vaccine.** / 1 to 2 doses if you smoke cigarettes or if you have certain conditions.  Meningococcal vaccine.** /  1 dose if you are age 1 to 79 years and a [place] living in a residence hall, or have one of several medical conditions, you need to get vaccinated against meningococcal disease. You may also need additional booster doses.  Hepatitis A vaccine.** / Consult your health care provider.  Hepatitis B vaccine.** / Consult your health care provider.  Haemophilus influenzae type b (Hib) vaccine.** / Consult your health care provider. Ages 71 to 66 years  Blood pressure check.** / Every 1 to 2 years.  Lipid and cholesterol check.** / Every 5 years beginning at age 77 years.  Lung cancer screening. / Every year if you are aged 50-80 years and have a 30-pack-year history of smoking and currently smoke or have quit within the past 15 years. Yearly screening is stopped once you have quit smoking for at least 15 years or develop a health problem that would prevent you from having lung cancer treatment.  Clinical breast exam.** / Every year after age 37 years.  BRCA-related cancer risk assessment.** / For women who have family members with a BRCA-related cancer (breast, ovarian, tubal, or peritoneal cancers).  Mammogram.** / Every year beginning at age 14 years and continuing for as long as you are in good health. Consult with your health care provider.  Pap test.** / Every 3 years starting at age 58 years through age 39 or 11 years with a history of 3 consecutive normal Pap tests.  HPV screening.** / Every 3 years from ages 43 years through ages 13 to 18 years with a history of 3 consecutive normal Pap tests.  Fecal occult blood test (FOBT) of stool. / Every year beginning at age 33 years and continuing until age 1 years. You may not need to do this test if you get a colonoscopy every 10 years.  Flexible sigmoidoscopy or colonoscopy.** / Every 5 years for a flexible sigmoidoscopy or every 10 years for a colonoscopy beginning at age 42 years and continuing until age 68 years.  Hepatitis C blood test.** / For all people born from [birth year] through  1965 and any individual with known risks for hepatitis C.  Skin self-exam. / Monthly.  Influenza vaccine. / Every year.  Tetanus, diphtheria, and acellular pertussis (Tdap/Td) vaccine.** / Consult your health care provider. Pregnant women should receive 1 dose of Tdap vaccine during each pregnancy. 1 dose of Td every 10 years.  Varicella vaccine.** / Consult your health care provider. Pregnant females who do not have evidence of immunity should receive the first dose after pregnancy.  Zoster vaccine.** / 1 dose for adults aged 19 years or older.  Measles, mumps, rubella (MMR) vaccine.** / You need at least 1 dose of MMR if you were born in 1957 or later. You may also need a 2nd dose. For females of childbearing age, rubella immunity should be determined. If there is no evidence of immunity, females who are not pregnant should be vaccinated. If there is no evidence of immunity, females who are pregnant should delay immunization until after pregnancy.  Pneumococcal 13-valent conjugate (PCV13) vaccine.** / Consult your health care provider.  Pneumococcal polysaccharide (PPSV23) vaccine.** / 1 to 2 doses if you smoke cigarettes or if you have certain conditions.  Meningococcal vaccine.** / Consult your health care provider.  Hepatitis A vaccine.** / Consult your health care provider.  Hepatitis B vaccine.** / Consult your health care provider.  Haemophilus influenzae type b (Hib) vaccine.** / Consult your health care provider. Ages 38  years and over  Blood pressure check.** / Every 1 to 2 years.  Lipid and cholesterol check.** / Every 5 years beginning at age 22 years.  Lung cancer screening. / Every year if you are aged 73-80 years and have a 30-pack-year history of smoking and currently smoke or have quit within the past 15 years. Yearly screening is stopped once you have quit smoking for at least 15 years or develop a health problem that would prevent you from having lung cancer  treatment.  Clinical breast exam.** / Every year after age 4 years.  BRCA-related cancer risk assessment.** / For women who have family members with a BRCA-related cancer (breast, ovarian, tubal, or peritoneal cancers).  Mammogram.** / Every year beginning at age 40 years and continuing for as long as you are in good health. Consult with your health care provider.  Pap test.** / Every 3 years starting at age 9 years through age 34 or 91 years with 3 consecutive normal Pap tests. Testing can be stopped between 65 and 70 years with 3 consecutive normal Pap tests and no abnormal Pap or HPV tests in the past 10 years.  HPV screening.** / Every 3 years from ages 57 years through ages 64 or 45 years with a history of 3 consecutive normal Pap tests. Testing can be stopped between 65 and 70 years with 3 consecutive normal Pap tests and no abnormal Pap or HPV tests in the past 10 years.  Fecal occult blood test (FOBT) of stool. / Every year beginning at age 15 years and continuing until age 17 years. You may not need to do this test if you get a colonoscopy every 10 years.  Flexible sigmoidoscopy or colonoscopy.** / Every 5 years for a flexible sigmoidoscopy or every 10 years for a colonoscopy beginning at age 86 years and continuing until age 71 years.  Hepatitis C blood test.** / For all people born from [birth year] through 1965 and any individual with known risks for hepatitis C.  Osteoporosis screening.** / A one-time screening for women ages 83 years and over and women at risk for fractures or osteoporosis.  Skin self-exam. / Monthly.  Influenza vaccine. / Every year.  Tetanus, diphtheria, and acellular pertussis (Tdap/Td) vaccine.** / 1 dose of Td every 10 years.  Varicella vaccine.** / Consult your health care provider.  Zoster vaccine.** / 1 dose for adults aged 61 years or older.  Pneumococcal 13-valent conjugate (PCV13) vaccine.** / Consult your health care provider.  Pneumococcal  polysaccharide (PPSV23) vaccine.** / 1 dose for all adults aged 28 years and older.  Meningococcal vaccine.** / Consult your health care provider.  Hepatitis A vaccine.** / Consult your health care provider.  Hepatitis B vaccine.** / Consult your health care provider.  Haemophilus influenzae type b (Hib) vaccine.** / Consult your health care provider. ** Family history and personal history of risk and conditions may change your health care provider's recommendations. Document Released: 06/01/2001 Document Revised: 08/20/2013 Document Reviewed: 08/31/2010 [facility] Patient Information 2015 [place], [place]. This information is not intended to replace advice given to you by your health care provider. Make sure you discuss any questions you have with your health care provider.

## 2014-01-03 NOTE — Progress Notes (Signed)
Pre visit review using our clinic review tool, if applicable. No additional management support is needed unless otherwise documented below in the visit note. 

## 2014-01-05 LAB — URINE CULTURE: Colony Count: >=100000

## 2014-01-16 ENCOUNTER — Ambulatory Visit
Admission: RE | Admit: 2014-01-16 | Discharge: 2014-01-16 | Disposition: A | Discharge status: Home / Self Care | Payer: BC Managed Care – PPO | Source: Ambulatory Visit | Attending: Family Medicine | Admitting: Family Medicine

## 2014-01-16 DIAGNOSIS — Z1231 Encounter for screening mammogram for malignant neoplasm of breast: Secondary | ICD-10-CM

## 2014-01-17 ENCOUNTER — Other Ambulatory Visit: Payer: Self-pay | Admitting: Family Medicine

## 2014-01-17 DIAGNOSIS — R928 Other abnormal and inconclusive findings on diagnostic imaging of breast: Secondary | ICD-10-CM

## 2014-01-23 ENCOUNTER — Ambulatory Visit
Admission: RE | Admit: 2014-01-23 | Discharge: 2014-01-23 | Disposition: A | Discharge status: Home / Self Care | Payer: BC Managed Care – PPO | Source: Ambulatory Visit | Attending: Family Medicine | Admitting: Family Medicine

## 2014-01-23 DIAGNOSIS — R928 Other abnormal and inconclusive findings on diagnostic imaging of breast: Secondary | ICD-10-CM

## 2014-01-30 ENCOUNTER — Ambulatory Visit: Payer: BC Managed Care – PPO | Admitting: Women's Health

## 2014-02-01 ENCOUNTER — Encounter: Payer: Self-pay | Admitting: Women's Health

## 2014-02-01 ENCOUNTER — Ambulatory Visit (INDEPENDENT_AMBULATORY_CARE_PROVIDER_SITE_OTHER): Payer: BC Managed Care – PPO | Admitting: Women's Health

## 2014-02-01 VITALS — BP 120/82 | Ht 67.0 in | Wt 206.0 lb

## 2014-02-01 DIAGNOSIS — N920 Excessive and frequent menstruation with regular cycle: Secondary | ICD-10-CM

## 2014-02-01 NOTE — Patient Instructions (Signed)

## 2014-02-01 NOTE — Progress Notes (Signed)
Patient ID: Cheryl Patterson, female   DOB: [birth year], 43 y.o.   MRN: [id number] Presents with complaints of  menorrhagia,  cycles monthly for 4 days, heavy, changing pad every one to 2 hours with cramping. Bled  through clothes last month with cycle. BTL, questioning  Ablation. Mammogram last month questionable fibroadenoma has followup scheduled for 6 months to check stability. A normal CBC and TSH at primary care 12/2013.  Exam: Appears well.  Menorrhagia/BTL  Plan: Options reviewed, her option information given and reviewed risks of infection, perforation hemorrhage, sonohysterogram prior, instructed to schedule week after cycle with Dr. Toney Rakes. Reviewed cycles are not immediately lighter. Keep scheduled breast ultrasound followup.

## 2014-02-05 ENCOUNTER — Other Ambulatory Visit: Payer: Self-pay | Admitting: Gynecology

## 2014-02-05 DIAGNOSIS — N920 Excessive and frequent menstruation with regular cycle: Secondary | ICD-10-CM

## 2014-02-18 ENCOUNTER — Encounter: Payer: Self-pay | Admitting: Women's Health

## 2014-02-27 ENCOUNTER — Ambulatory Visit: Payer: BC Managed Care – PPO | Admitting: Gynecology

## 2014-02-27 ENCOUNTER — Other Ambulatory Visit: Payer: BC Managed Care – PPO

## 2014-04-01 ENCOUNTER — Telehealth: Payer: Self-pay | Admitting: Family Medicine

## 2014-04-01 DIAGNOSIS — F988 Other specified behavioral and emotional disorders with onset usually occurring in childhood and adolescence: Secondary | ICD-10-CM

## 2014-04-01 MED ORDER — AMPHETAMINE-DEXTROAMPHET ER 30 MG PO CP24: 30.0000 mg | ORAL_CAPSULE | ORAL | Status: DC

## 2014-04-01 NOTE — Telephone Encounter (Signed)
adderall xr 20 mg #30  1 po q am---  Can give 3 rx if pt would like D/c vyvanse

## 2014-04-01 NOTE — Telephone Encounter (Signed)
Pt requesting a refill of adderall time release.

## 2014-04-01 NOTE — Telephone Encounter (Signed)
Correction the patient was on the 30 mg XR.     KP

## 2014-04-01 NOTE — Telephone Encounter (Signed)
Spoke with patient and she said she would like to go back to the 20 mg Adderall XR, she did not like how the Vyvanse made her feel.. Please advise     KP

## 2014-05-14 ENCOUNTER — Telehealth: Payer: Self-pay | Admitting: Family Medicine

## 2014-05-14 MED ORDER — AMPHETAMINE-DEXTROAMPHET ER 30 MG PO CP24: 30.0000 mg | ORAL_CAPSULE | ORAL | Status: DC

## 2014-05-14 NOTE — Telephone Encounter (Signed)
VM left advising Rx ready for pick up.     KP 

## 2014-05-14 NOTE — Telephone Encounter (Signed)
Last seen 01/03/14 and filled 04/01/14 #30 UDS 06/14/12 low risk   Please advise     KP

## 2014-05-14 NOTE — Telephone Encounter (Signed)
Caller name:Nashiya Frisbee Relationship to patient:self Can be reached:[phone] Pharmacy:  Reason for call:Requesting refill on adderall 30mg  24 hr cap

## 2014-05-14 NOTE — Telephone Encounter (Signed)
Refill x1-- can do 3 months

## 2014-06-27 ENCOUNTER — Telehealth: Payer: Self-pay | Admitting: Family Medicine

## 2014-06-27 NOTE — Telephone Encounter (Signed)
Caller name: Tyeasha Relation to pt: self Call back number: [phone] Pharmacy:  Reason for call:   Requesting a new adderall rx.

## 2014-06-28 NOTE — Telephone Encounter (Signed)
Requesting:amphetamine-dextroamphetamine (ADDERALL XR) 30 MG Contract 3.16.2014 UDS 2.26.2014--negative---low risk Last OV 9.17.2015 Last Refill 1.26.16--#30 no refills  Please Advise

## 2014-07-01 MED ORDER — AMPHETAMINE-DEXTROAMPHET ER 30 MG PO CP24: 30.0000 mg | ORAL_CAPSULE | ORAL | Status: DC

## 2014-07-01 NOTE — Telephone Encounter (Signed)
Printed

## 2014-07-01 NOTE — Telephone Encounter (Signed)
LM for patient to pick up at front desk.

## 2014-07-01 NOTE — Telephone Encounter (Signed)
Refill x1 

## 2014-08-02 ENCOUNTER — Other Ambulatory Visit: Payer: Self-pay

## 2014-08-02 ENCOUNTER — Other Ambulatory Visit: Payer: Self-pay | Admitting: Family Medicine

## 2014-08-02 DIAGNOSIS — N6320 Unspecified lump in the left breast, unspecified quadrant: Secondary | ICD-10-CM

## 2014-08-13 ENCOUNTER — Other Ambulatory Visit: Payer: Self-pay

## 2014-08-14 ENCOUNTER — Ambulatory Visit
Admission: RE | Admit: 2014-08-14 | Discharge: 2014-08-14 | Disposition: A | Discharge status: Home / Self Care | Payer: BLUE CROSS/BLUE SHIELD | Source: Ambulatory Visit | Attending: Family Medicine | Admitting: Family Medicine

## 2014-08-14 DIAGNOSIS — N6320 Unspecified lump in the left breast, unspecified quadrant: Secondary | ICD-10-CM

## 2014-08-23 ENCOUNTER — Encounter: Payer: Self-pay | Admitting: Family Medicine

## 2014-08-23 ENCOUNTER — Ambulatory Visit (INDEPENDENT_AMBULATORY_CARE_PROVIDER_SITE_OTHER): Payer: BLUE CROSS/BLUE SHIELD | Admitting: Family Medicine

## 2014-08-23 ENCOUNTER — Ambulatory Visit (HOSPITAL_BASED_OUTPATIENT_CLINIC_OR_DEPARTMENT_OTHER)
Admission: RE | Admit: 2014-08-23 | Discharge: 2014-08-23 | Disposition: A | Discharge status: Home / Self Care | Payer: BLUE CROSS/BLUE SHIELD | Source: Ambulatory Visit | Attending: Family Medicine | Admitting: Family Medicine

## 2014-08-23 ENCOUNTER — Telehealth: Payer: Self-pay | Admitting: *Deleted

## 2014-08-23 VITALS — BP 120/78 | HR 80 | Temp 98.9°F | Wt 196.0 lb

## 2014-08-23 DIAGNOSIS — F900 Attention-deficit hyperactivity disorder, predominantly inattentive type: Secondary | ICD-10-CM

## 2014-08-23 DIAGNOSIS — R071 Chest pain on breathing: Secondary | ICD-10-CM

## 2014-08-23 DIAGNOSIS — R079 Chest pain, unspecified: Secondary | ICD-10-CM | POA: Insufficient documentation

## 2014-08-23 DIAGNOSIS — M25511 Pain in right shoulder: Secondary | ICD-10-CM | POA: Diagnosis not present

## 2014-08-23 DIAGNOSIS — J4520 Mild intermittent asthma, uncomplicated: Secondary | ICD-10-CM | POA: Diagnosis not present

## 2014-08-23 LAB — CBC WITH DIFFERENTIAL/PLATELET
BASOS ABS: 0 10*3/uL (ref 0.0–0.1)
Basophils Relative: 0.2 % (ref 0.0–3.0)
EOS ABS: 0.1 10*3/uL (ref 0.0–0.7)
Eosinophils Relative: 1 % (ref 0.0–5.0)
HEMATOCRIT: 39.3 % (ref 36.0–46.0)
Hemoglobin: 13.2 g/dL (ref 12.0–15.0)
LYMPHS ABS: 2.1 10*3/uL (ref 0.7–4.0)
Lymphocytes Relative: 37.6 % (ref 12.0–46.0)
MCHC: 33.7 g/dL (ref 30.0–36.0)
MCV: 85.9 fl (ref 78.0–100.0)
MONO ABS: 0.4 10*3/uL (ref 0.1–1.0)
Monocytes Relative: 7.1 % (ref 3.0–12.0)
Neutro Abs: 3.1 10*3/uL (ref 1.4–7.7)
Neutrophils Relative %: 54.1 % (ref 43.0–77.0)
PLATELETS: 303 10*3/uL (ref 150.0–400.0)
RBC: 4.57 Mil/uL (ref 3.87–5.11)
RDW: 13.3 % (ref 11.5–15.5)
WBC: 5.7 10*3/uL (ref 4.0–10.5)

## 2014-08-23 LAB — BASIC METABOLIC PANEL
BUN: 9 mg/dL (ref 6–23)
CO2: 29 mEq/L (ref 19–32)
Calcium: 9.5 mg/dL (ref 8.4–10.5)
Chloride: 102 mEq/L (ref 96–112)
Creatinine, Ser: 0.86 mg/dL (ref 0.40–1.20)
GFR: 92.05 mL/min (ref >60.00)
GLUCOSE: 98 mg/dL (ref 70–99)
POTASSIUM: 3.8 meq/L (ref 3.5–5.1)
SODIUM: 137 meq/L (ref 135–145)

## 2014-08-23 LAB — HEPATIC FUNCTION PANEL
ALK PHOS: 82 U/L (ref 39–117)
ALT: 12 U/L (ref 0–35)
AST: 16 U/L (ref 0–37)
Albumin: 3.9 g/dL (ref 3.5–5.2)
BILIRUBIN TOTAL: 0.5 mg/dL (ref 0.2–1.2)
Bilirubin, Direct: 0.1 mg/dL (ref 0.0–0.3)
Total Protein: 7.3 g/dL (ref 6.0–8.3)

## 2014-08-23 LAB — TROPONIN I

## 2014-08-23 LAB — D-DIMER, QUANTITATIVE (NOT AT ARMC): D DIMER QUANT: 0.28 ug{FEU}/mL (ref 0.00–0.48)

## 2014-08-23 MED ORDER — MOMETASONE FURO-FORMOTEROL FUM 100-5 MCG/ACT IN AERO: 2.0000 | INHALATION_SPRAY | Freq: Two times a day (BID) | RESPIRATORY_TRACT | Status: DC | PRN

## 2014-08-23 MED ORDER — AMPHETAMINE-DEXTROAMPHET ER 30 MG PO CP24: 30.0000 mg | ORAL_CAPSULE | ORAL | Status: DC

## 2014-08-23 MED ORDER — ALBUTEROL SULFATE HFA 108 (90 BASE) MCG/ACT IN AERS: 2.0000 | INHALATION_SPRAY | Freq: Four times a day (QID) | RESPIRATORY_TRACT | Status: DC | PRN

## 2014-08-23 MED ORDER — ALBUTEROL SULFATE (2.5 MG/3ML) 0.083% IN NEBU
2.5000 mg | INHALATION_SOLUTION | Freq: Once | RESPIRATORY_TRACT | Status: AC
  Administered 2014-08-23: 2.5 mg via RESPIRATORY_TRACT

## 2014-08-23 NOTE — Telephone Encounter (Signed)
Detailed message left on VM advising labs are normal.      KP

## 2014-08-23 NOTE — Assessment & Plan Note (Signed)
If returns go to ER ? Etiology asthma-- pt had stopped using her inhalers

## 2014-08-23 NOTE — Patient Instructions (Signed)

## 2014-08-23 NOTE — Progress Notes (Signed)
Pre visit review using our clinic review tool, if applicable. No additional management support is needed unless otherwise documented below in the visit note. 

## 2014-08-23 NOTE — Telephone Encounter (Signed)
Troponin neg

## 2014-08-23 NOTE — Telephone Encounter (Signed)
Troponin negative.

## 2014-08-23 NOTE — Progress Notes (Signed)
Patient ID: Cheryl Patterson, female    DOB: [birth year]  Age: 44 y.o. MRN: [id number]    Subjective:  Subjective HPI Cheryl Patterson presents for c/o r shoulder pain x 1 year -- no known injury.  The potentially more serious problem is her CP and SOB that has been worsening over last several days.  Pt did stop her inhalers and wonders if this is the problem.  She also here to get refill of adderall. +-  Review of Systems  Constitutional: Positive for fatigue. Negative for activity change, appetite change and unexpected weight change.  HENT: Negative for congestion, ear discharge, ear pain, rhinorrhea, sinus pressure and sneezing.   Respiratory: Positive for chest tightness and shortness of breath. Negative for cough and wheezing.   Cardiovascular: Negative for chest pain and palpitations.  Gastrointestinal: Negative for nausea, vomiting, abdominal pain and diarrhea.  Musculoskeletal: Negative for myalgias, back pain and arthralgias.  Neurological: Negative for dizziness, light-headedness and numbness.  Hematological: Negative for adenopathy.  Psychiatric/Behavioral: Negative for behavioral problems, dysphoric mood and decreased concentration. The patient is not nervous/anxious.     History Past Medical History  Diagnosis Date  . Hyperlipidemia   . Chronic headaches   . Depression     [facility]---suicidal ideation  . Bipolar depression   . Allergy   . Anxiety   . Chronic kidney disease     kidney stones    She has past surgical history that includes Tubal ligation; LEEP (2005); Breast surgery; Other surgical history; Mouth surgery; Lipoma excision; and kidney stones.   Her family history includes Brain cancer in her maternal aunt; Breast cancer in her mother and paternal aunt; Cancer in her maternal aunt, maternal uncle, and paternal aunt; Cancer (age of onset: 7) in her mother; Colon cancer in her maternal aunt and mother; Diabetes in her maternal grandfather; Heart  disease in her paternal grandmother; Hypertension in her paternal grandmother and another family member; Stomach cancer in her maternal aunt; Stroke in her paternal grandmother; Uterine cancer in her maternal aunt. There is no history of Esophageal cancer or Rectal cancer.She reports that she has never smoked. She has never used smokeless tobacco. She reports that she does not drink alcohol or use illicit drugs.  No current outpatient prescriptions on file prior to visit.   No current facility-administered medications on file prior to visit.     Objective:  Objective Physical Exam  Constitutional: She is oriented to person, place, and time. She appears well-developed and well-nourished. No distress.  HENT:  Right Ear: External ear normal.  Left Ear: External ear normal.  Nose: Nose normal.  Mouth/Throat: Oropharynx is clear and moist.  Eyes: EOM are normal. Pupils are equal, round, and reactive to light.  Neck: Normal range of motion. Neck supple.  Cardiovascular: Normal rate, regular rhythm and normal heart sounds.   No murmur heard. Pulmonary/Chest: Effort normal. No respiratory distress. She has decreased breath sounds. She has no wheezes. She has no rhonchi. She has no rales. She exhibits no tenderness.  Neurological: She is alert and oriented to person, place, and time.  Psychiatric: She has a normal mood and affect. Her behavior is normal. Judgment and thought content normal.   BP 120/78 mmHg  Pulse 80  Temp(Src) 98.9 F (37.2 C) (Oral)  Wt 196 lb (88.905 kg)  SpO2 98%  LMP 08/07/2014 Wt Readings from Last 3 Encounters:  08/23/14 196 lb (88.905 kg)  02/01/14 206 lb (93.441 kg)  01/03/14  201 lb 1 oz (91.2 kg)     Lab Results  Component Value Date   WBC 5.5 01/03/2014   HGB 13.2 01/03/2014   HCT 39.5 01/03/2014   PLT 306.0 01/03/2014   GLUCOSE 97 01/03/2014   CHOL 231* 01/03/2014   TRIG 70.0 01/03/2014   HDL 39.40 01/03/2014   LDLDIRECT 144.3 06/25/2010   LDLCALC  178* 01/03/2014   ALT 15 01/03/2014   AST 19 01/03/2014   NA 136 01/03/2014   K 3.8 01/03/2014   CL 99 01/03/2014   CREATININE 0.9 01/03/2014   BUN 9 01/03/2014   CO2 28 01/03/2014   TSH 0.96 01/03/2014    US Breast Kenly Axilla  08/14/2014   CLINICAL DATA:  First six-month follow-up for lesion within the left breast.  EXAM: ULTRASOUND OF THE LEFT BREAST  COMPARISON:  01/16/2014, 01/23/2014, and earlier  FINDINGS: On physical exam, I palpate no abnormality in the upper-outer quadrant of the left breast.  Targeted ultrasound is performed, showing a circumscribed hypoechoic oval mass in the 1 o'clock location of the left breast 10 cm from the nipple. This measures 1.8 by 2.1 x 1.2 cm. There is increased through transmission and minimal internal vascularity. No significant interval change since prior study P  IMPRESSION: 1. 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 2. Continued followup is recommended to document 2 years of stability.  RECOMMENDATION: Bilateral diagnostic mammogram and left breast ultrasound suggested in 6 months.  I have discussed the findings and recommendations with the patient. Results were also provided in writing at the conclusion of the visit. If applicable, a reminder letter will be sent to the patient regarding the next appointment.  BI-RADS CATEGORY  3: Probably benign.   Electronically Signed   By: Nolon Nations M.D.   On: 08/14/2014 08:08    EKG-- NSR Assessment & Plan:  Plan I have discontinued Ms. Aller's albuterol, diclofenac, and cyclobenzaprine. I am also having her start on albuterol, amphetamine-dextroamphetamine, and amphetamine-dextroamphetamine. Additionally, I am having her maintain her mometasone-formoterol and amphetamine-dextroamphetamine. We administered albuterol.  Meds ordered this encounter  Medications  . albuterol (PROVENTIL) (2.5 MG/3ML) 0.083% nebulizer solution 2.5 mg    Sig:   .  mometasone-formoterol (DULERA) 100-5 MCG/ACT AERO    Sig: Inhale 2 puffs into the lungs 2 (two) times daily as needed.    Dispense:  1 Inhaler    Refill:  5  . albuterol (PROVENTIL HFA;VENTOLIN HFA) 108 (90 BASE) MCG/ACT inhaler    Sig: Inhale 2 puffs into the lungs every 6 (six) hours as needed for wheezing or shortness of breath.    Dispense:  1 Inhaler    Refill:  2  . amphetamine-dextroamphetamine (ADDERALL XR) 30 MG 24 hr capsule    Sig: Take 1 capsule (30 mg total) by mouth every morning.    Dispense:  30 capsule    Refill:  0  . amphetamine-dextroamphetamine (ADDERALL XR) 30 MG 24 hr capsule    Sig: Take 1 capsule (30 mg total) by mouth every morning.    Dispense:  30 capsule    Refill:  0    Do not fill until September 23, 2014  . amphetamine-dextroamphetamine (ADDERALL XR) 30 MG 24 hr capsule    Sig: Take 1 capsule (30 mg total) by mouth every morning.    Dispense:  30 capsule    Refill:  0    Do not fill until October 23 2014  Problem List Items Addressed This Visit    Chest pain    If returns go to ER ? Etiology asthma-- pt had stopped using her inhalers      Relevant Orders   Troponin I   EKG 12-Lead (Completed)   DG Chest 2 View (Completed)   Basic metabolic panel   CBC with Differential/Platelet   Hepatic function panel   D-Dimer, Quantitative   Troponin I    Other Visit Diagnoses    Chest pain, unspecified chest pain type    -  Primary    Relevant Medications    albuterol (PROVENTIL) (2.5 MG/3ML) 0.083% nebulizer solution 2.5 mg (Completed)    Other Relevant Orders    EKG 12-Lead (Completed)    DG Chest 2 View (Completed)    Basic metabolic panel    CBC with Differential/Platelet    Hepatic function panel    D-Dimer, Quantitative    Troponin I    Pain in joint, shoulder region, right        Relevant Orders    Ambulatory referral to Sports Medicine    Troponin I    Asthma, chronic, mild intermittent, uncomplicated        Relevant Medications     albuterol (PROVENTIL) (2.5 MG/3ML) 0.083% nebulizer solution 2.5 mg (Completed)    mometasone-formoterol (DULERA) 100-5 MCG/ACT AERO    albuterol (PROVENTIL HFA;VENTOLIN HFA) 108 (90 BASE) MCG/ACT inhaler    Other Relevant Orders    Troponin I    Attention deficit hyperactivity disorder (ADHD), predominantly inattentive type        Relevant Medications    amphetamine-dextroamphetamine (ADDERALL XR) 30 MG 24 hr capsule    amphetamine-dextroamphetamine (ADDERALL XR) 30 MG 24 hr capsule    amphetamine-dextroamphetamine (ADDERALL XR) 30 MG 24 hr capsule    Other Relevant Orders    Troponin I       Follow-up: Return in about 6 months (around 02/23/2015), or if symptoms worsen or fail to improve, for f/u.  Garnet Koyanagi, DO

## 2014-08-29 ENCOUNTER — Ambulatory Visit: Payer: BLUE CROSS/BLUE SHIELD | Admitting: Family Medicine

## 2014-09-02 ENCOUNTER — Encounter: Payer: Self-pay | Admitting: Family Medicine

## 2014-09-02 ENCOUNTER — Ambulatory Visit (INDEPENDENT_AMBULATORY_CARE_PROVIDER_SITE_OTHER): Payer: BLUE CROSS/BLUE SHIELD | Admitting: Family Medicine

## 2014-09-02 VITALS — BP 116/72 | HR 118 | Ht 67.0 in | Wt 200.0 lb

## 2014-09-02 DIAGNOSIS — M25511 Pain in right shoulder: Secondary | ICD-10-CM

## 2014-09-02 MED ORDER — METHYLPREDNISOLONE ACETATE 40 MG/ML IJ SUSP
40.0000 mg | Freq: Once | INTRAMUSCULAR | Status: AC
  Administered 2014-09-02: 40 mg via INTRA_ARTICULAR

## 2014-09-02 NOTE — Patient Instructions (Signed)
You have rotator cuff impingement Try to avoid painful activities (overhead activities, lifting with extended arm) as much as possible. Aleve 2 tabs twice a day with food OR ibuprofen 3 tabs three times a day with food for pain and inflammation. Can take tylenol in addition to this. Subacromial injection may be beneficial to help with pain and to decrease inflammation - you were given this today. Consider physical therapy with transition to home exercise program. Do home exercise program with theraband and scapular stabilization exercises daily - these are very important for long term relief even if an injection was given.  3 sets of 10 once a day - wait about 5 days before starting these. If not improving at follow-up we will consider physical therapy, imaging, and/or nitro patches.

## 2014-09-04 DIAGNOSIS — M25511 Pain in right shoulder: Secondary | ICD-10-CM | POA: Insufficient documentation

## 2014-09-04 NOTE — Progress Notes (Signed)
PCP and referred by: Garnet Koyanagi, DO  Subjective:   HPI: Patient is a 44 y.o. female here for right shoulder pain.  Patient denies known injury. States she's had about 1 year of right shoulder pain that comes and goes though recently has been more constant. No falls, no heavy lifting prior to this. Thought she had slept wrong. Tried pain medicine, muscle relaxant without help. Worse lying on right side. Feels tight with overhead motions. No swelling or bruising  Past Medical History  Diagnosis Date  . Hyperlipidemia   . Chronic headaches   . Depression     [facility]---suicidal ideation  . Bipolar depression   . Allergy   . Anxiety   . Chronic kidney disease     kidney stones    Current Outpatient Prescriptions on File Prior to Visit  Medication Sig Dispense Refill  . albuterol (PROVENTIL HFA;VENTOLIN HFA) 108 (90 BASE) MCG/ACT inhaler Inhale 2 puffs into the lungs every 6 (six) hours as needed for wheezing or shortness of breath. 1 Inhaler 2  . amphetamine-dextroamphetamine (ADDERALL XR) 30 MG 24 hr capsule Take 1 capsule (30 mg total) by mouth every morning. 30 capsule 0  . amphetamine-dextroamphetamine (ADDERALL XR) 30 MG 24 hr capsule Take 1 capsule (30 mg total) by mouth every morning. 30 capsule 0  . amphetamine-dextroamphetamine (ADDERALL XR) 30 MG 24 hr capsule Take 1 capsule (30 mg total) by mouth every morning. 30 capsule 0  . mometasone-formoterol (DULERA) 100-5 MCG/ACT AERO Inhale 2 puffs into the lungs 2 (two) times daily as needed. 1 Inhaler 5   No current facility-administered medications on file prior to visit.    Past Surgical History  Procedure Laterality Date  . Tubal ligation    . [place]  2005  . Breast surgery      BREAST LUMP REMOVAL ??WHICH BREAST  . Other surgical history      RENAL STONE REMOVAL  . Mouth surgery    . Lipoma excision    . Kidney stones      Allergies  Allergen Reactions  . Codeine     Itchy throat, whelps on  skin  . Other Swelling and Rash    tomatoes    History   Social History  . Marital Status: Married    Spouse Name: N/A  . Number of Children: 2  . Years of Education: N/A   Occupational History  . levelor    Social History Main Topics  . Smoking status: Never Smoker   . Smokeless tobacco: Never Used  . Alcohol Use: No  . Drug Use: No  . Sexual Activity:    Partners: Male    Birth Control/ Protection: None   Other Topics Concern  . Not on file   Social History Narrative   Exercise--walk qd                    Video--  rare    Family History  Problem Relation Age of Onset  . Hypertension    . Stroke Paternal Grandmother   . Hypertension Paternal Grandmother   . Heart disease Paternal Grandmother   . Breast cancer Paternal Aunt   . Cancer Paternal Aunt     breast  . Stomach cancer Maternal Aunt   . Colon cancer Maternal Aunt   . Cancer Maternal Aunt     colon  . Uterine cancer Maternal Aunt   . Brain cancer Maternal Aunt   . Diabetes Maternal Grandfather   .  Breast cancer Mother   . Colon cancer Mother   . Cancer Mother 65    colon  . Cancer Maternal Uncle     COLON  . Esophageal cancer Neg Hx   . Rectal cancer Neg Hx     BP 116/72 mmHg  Pulse 118  Ht 5\' 7"  (1.702 m)  Wt 200 lb (90.719 kg)  BMI 31.32 kg/m2  LMP 08/07/2014  Review of Systems: See HPI above.    Objective:  Physical Exam:  Gen: NAD  Right shoulder: No swelling, ecchymoses.  No gross deformity. No TTP. FROM with painful arc. Positive Hawkins, Neers. Negative Speeds, Yergasons. Strength 5/5 with empty can and resisted internal/external rotation.  Pain empty can. Negative apprehension. NV intact distally.    Assessment & Plan:  1. Right shoulder pain - 2/2 impingement.  Shown home exercises to do daily.  NSAIDs as needed.  Subacromial injection given today.  Consider PT, imaging, nitro patches if not improving.  F/u in 6 weeks.  After informed written consent, patient was  seated on exam table. Right shoulder was prepped with alcohol swab and utilizing posterior approach, patient's right subacromial space was injected with 3:1 marcaine: depomedrol. Patient tolerated the procedure well without immediate complications.

## 2014-09-04 NOTE — Assessment & Plan Note (Signed)
2/2 impingement.  Shown home exercises to do daily.  NSAIDs as needed.  Subacromial injection given today.  Consider PT, imaging, nitro patches if not improving.  F/u in 6 weeks.  After informed written consent, patient was seated on exam table. Right shoulder was prepped with alcohol swab and utilizing posterior approach, patient's right subacromial space was injected with 3:1 marcaine: depomedrol. Patient tolerated the procedure well without immediate complications.

## 2014-11-04 ENCOUNTER — Ambulatory Visit: Payer: BLUE CROSS/BLUE SHIELD | Admitting: Women's Health

## 2014-12-03 ENCOUNTER — Encounter: Payer: Self-pay | Admitting: Women's Health

## 2014-12-13 ENCOUNTER — Ambulatory Visit (INDEPENDENT_AMBULATORY_CARE_PROVIDER_SITE_OTHER): Payer: BLUE CROSS/BLUE SHIELD | Admitting: Women's Health

## 2014-12-13 ENCOUNTER — Encounter: Payer: Self-pay | Admitting: Women's Health

## 2014-12-13 VITALS — BP 124/82 | Ht 67.0 in | Wt 198.0 lb

## 2014-12-13 DIAGNOSIS — N921 Excessive and frequent menstruation with irregular cycle: Secondary | ICD-10-CM

## 2014-12-13 DIAGNOSIS — B3731 Acute candidiasis of vulva and vagina: Secondary | ICD-10-CM

## 2014-12-13 DIAGNOSIS — B373 Candidiasis of vulva and vagina: Secondary | ICD-10-CM | POA: Diagnosis not present

## 2014-12-13 DIAGNOSIS — Z01419 Encounter for gynecological examination (general) (routine) without abnormal findings: Secondary | ICD-10-CM | POA: Diagnosis not present

## 2014-12-13 LAB — PREGNANCY, URINE: Preg Test, Ur: NEGATIVE

## 2014-12-13 MED ORDER — FLUCONAZOLE 150 MG PO TABS: 150.0000 mg | ORAL_TABLET | Freq: Once | ORAL | Status: DC

## 2014-12-13 MED ORDER — MEDROXYPROGESTERONE ACETATE 10 MG PO TABS: 10.0000 mg | ORAL_TABLET | Freq: Every day | ORAL | Status: DC

## 2014-12-13 NOTE — Patient Instructions (Signed)
Health Maintenance Adopting a healthy lifestyle and getting preventive care can go a long way to promote health and wellness. Talk with your health care provider about what schedule of regular examinations is right for you. This is a good chance for you to check in with your provider about disease prevention and staying healthy. In between checkups, there are plenty of things you can do on your own. Experts have done a lot of research about which lifestyle changes and preventive measures are most likely to keep you healthy. Ask your health care provider for more information. WEIGHT AND DIET  Eat a healthy diet  Be sure to include plenty of vegetables, fruits, low-fat dairy products, and lean protein.  Do not eat a lot of foods high in solid fats, added sugars, or salt.  Get regular exercise. This is one of the most important things you can do for your health.  Most adults should exercise for at least 150 minutes each week. The exercise should increase your heart rate and make you sweat (moderate-intensity exercise).  Most adults should also do strengthening exercises at least twice a week. This is in addition to the moderate-intensity exercise.  Maintain a healthy weight  Body mass index (BMI) is a measurement that can be used to identify possible weight problems. It estimates body fat based on height and weight. Your health care provider can help determine your BMI and help you achieve or maintain a healthy weight.  For females 8 years of age and older:   A BMI below 18.5 is considered underweight.  A BMI of 18.5 to 24.9 is normal.  A BMI of 25 to 29.9 is considered overweight.  A BMI of 30 and above is considered obese.  Watch levels of cholesterol and blood lipids  You should start having your blood tested for lipids and cholesterol at 44 years of age, then have this test every 5 years.  You may need to have your cholesterol levels checked more often if:  Your lipid or  cholesterol levels are high.  You are older than 44 years of age.  You are at high risk for heart disease.  CANCER SCREENING   Lung Cancer  Lung cancer screening is recommended for adults 16-58 years old who are at high risk for lung cancer because of a history of smoking.  A yearly low-dose CT scan of the lungs is recommended for people who:  Currently smoke.  Have quit within the past 15 years.  Have at least a 30-pack-year history of smoking. A pack year is smoking an average of one pack of cigarettes a day for 1 year.  Yearly screening should continue until it has been 15 years since you quit.  Yearly screening should stop if you develop a health problem that would prevent you from having lung cancer treatment.  Breast Cancer  Practice breast self-awareness. This means understanding how your breasts normally appear and feel.  It also means doing regular breast self-exams. Let your health care provider know about any changes, no matter how small.  If you are in your 20s or 30s, you should have a clinical breast exam (CBE) by a health care provider every 1-3 years as part of a regular health exam.  If you are 22 or older, have a CBE every year. Also consider having a breast X-ray (mammogram) every year.  If you have a family history of breast cancer, talk to your health care provider about genetic screening.  If you are  at high risk for breast cancer, talk to your health care provider about having an MRI and a mammogram every year.  Breast cancer gene (BRCA) assessment is recommended for women who have family members with BRCA-related cancers. BRCA-related cancers include:  Breast.  Ovarian.  Tubal.  Peritoneal cancers.  Results of the assessment will determine the need for genetic counseling and BRCA1 and BRCA2 testing. Cervical Cancer Routine pelvic examinations to screen for cervical cancer are no longer recommended for nonpregnant women who are considered low  risk for cancer of the pelvic organs (ovaries, uterus, and vagina) and who do not have symptoms. A pelvic examination may be necessary if you have symptoms including those associated with pelvic infections. Ask your health care provider if a screening pelvic exam is right for you.   The Pap test is the screening test for cervical cancer for women who are considered at risk.  If you had a hysterectomy for a problem that was not cancer or a condition that could lead to cancer, then you no longer need Pap tests.  If you are older than 65 years, and you have had normal Pap tests for the past 10 years, you no longer need to have Pap tests.  If you have had past treatment for cervical cancer or a condition that could lead to cancer, you need Pap tests and screening for cancer for at least 20 years after your treatment.  If you no longer get a Pap test, assess your risk factors if they change (such as having a new sexual partner). This can affect whether you should start being screened again.  Some women have medical problems that increase their chance of getting cervical cancer. If this is the case for you, your health care provider may recommend more frequent screening and Pap tests.  The human papillomavirus (HPV) test is another test that may be used for cervical cancer screening. The HPV test looks for the virus that can cause cell changes in the cervix. The cells collected during the Pap test can be tested for HPV.  The HPV test can be used to screen women 67 years of age and older. Getting tested for HPV can extend the interval between normal Pap tests from three to five years.  An HPV test also should be used to screen women of any age who have unclear Pap test results.  After 44 years of age, women should have HPV testing as often as Pap tests.  Colorectal Cancer  This type of cancer can be detected and often prevented.  Routine colorectal cancer screening usually begins at 44 years of  age and continues through 44 years of age.  Your health care provider may recommend screening at an earlier age if you have risk factors for colon cancer.  Your health care provider may also recommend using home test kits to check for hidden blood in the stool.  A small camera at the end of a tube can be used to examine your colon directly (sigmoidoscopy or colonoscopy). This is done to check for the earliest forms of colorectal cancer.  Routine screening usually begins at age 66.  Direct examination of the colon should be repeated every 5-10 years through 44 years of age. However, you may need to be screened more often if early forms of precancerous polyps or small growths are found. Skin Cancer  Check your skin from head to toe regularly.  Tell your health care provider about any new moles or changes in  moles, especially if there is a change in a mole's shape or color.  Also tell your health care provider if you have a mole that is larger than the size of a pencil eraser.  Always use sunscreen. Apply sunscreen liberally and repeatedly throughout the day.  Protect yourself by wearing long sleeves, pants, a wide-brimmed hat, and sunglasses whenever you are outside. HEART DISEASE, DIABETES, AND HIGH BLOOD PRESSURE   Have your blood pressure checked at least every 1-2 years. High blood pressure causes heart disease and increases the risk of stroke.  If you are between 75 years and 42 years old, ask your health care provider if you should take aspirin to prevent strokes.  Have regular diabetes screenings. This involves taking a blood sample to check your fasting blood sugar level.  If you are at a normal weight and have a low risk for diabetes, have this test once every three years after 44 years of age.  If you are overweight and have a high risk for diabetes, consider being tested at a younger age or more often. PREVENTING INFECTION  Hepatitis B  If you have a higher risk for  hepatitis B, you should be screened for this virus. You are considered at high risk for hepatitis B if:  You were born in a country where hepatitis B is common. Ask your health care provider which countries are considered high risk.  Your parents were born in a high-risk country, and you have not been immunized against hepatitis B (hepatitis B vaccine).  You have HIV or AIDS.  You use needles to inject street drugs.  You live with someone who has hepatitis B.  You have had sex with someone who has hepatitis B.  You get hemodialysis treatment.  You take certain medicines for conditions, including cancer, organ transplantation, and autoimmune conditions. Hepatitis C  Blood testing is recommended for:  Everyone born from [birth year] through 1965.  Anyone with known risk factors for hepatitis C. Sexually transmitted infections (STIs)  You should be screened for sexually transmitted infections (STIs) including gonorrhea and chlamydia if:  You are sexually active and are younger than 44 years of age.  You are older than 44 years of age and your health care provider tells you that you are at risk for this type of infection.  Your sexual activity has changed since you were last screened and you are at an increased risk for chlamydia or gonorrhea. Ask your health care provider if you are at risk.  If you do not have HIV, but are at risk, it may be recommended that you take a prescription medicine daily to prevent HIV infection. This is called pre-exposure prophylaxis (PrEP). You are considered at risk if:  You are sexually active and do not regularly use condoms or know the HIV status of your partner(s).  You take drugs by injection.  You are sexually active with a partner who has HIV. Talk with your health care provider about whether you are at high risk of being infected with HIV. If you choose to begin PrEP, you should first be tested for HIV. You should then be tested every 3 months for  as long as you are taking PrEP.  PREGNANCY   If you are premenopausal and you may become pregnant, ask your health care provider about preconception counseling.  If you may become pregnant, take 400 to 800 micrograms (mcg) of folic acid every day.  If you want to prevent pregnancy, talk to your  health care provider about birth control (contraception). OSTEOPOROSIS AND MENOPAUSE   Osteoporosis is a disease in which the bones lose minerals and strength with aging. This can result in serious bone fractures. Your risk for osteoporosis can be identified using a bone density scan.  If you are 65 years of age or older, or if you are at risk for osteoporosis and fractures, ask your health care provider if you should be screened.  Ask your health care provider whether you should take a calcium or vitamin D supplement to lower your risk for osteoporosis.  Menopause may have certain physical symptoms and risks.  Hormone replacement therapy may reduce some of these symptoms and risks. Talk to your health care provider about whether hormone replacement therapy is right for you.  HOME CARE INSTRUCTIONS   Schedule regular health, dental, and eye exams.  Stay current with your immunizations.   Do not use any tobacco products including cigarettes, chewing tobacco, or electronic cigarettes.  If you are pregnant, do not drink alcohol.  If you are breastfeeding, limit how much and how often you drink alcohol.  Limit alcohol intake to no more than 1 drink per day for nonpregnant women. One drink equals 12 ounces of beer, 5 ounces of wine, or 1 ounces of hard liquor.  Do not use street drugs.  Do not share needles.  Ask your health care provider for help if you need support or information about quitting drugs.  Tell your health care provider if you often feel depressed.  Tell your health care provider if you have ever been abused or do not feel safe at home. Document Released: 10/19/2010  Document Revised: 08/20/2013 Document Reviewed: 03/07/2013 ExitCare Patient Information 2015 [employer]. This information is not intended to replace advice given to you by your health care provider. Make sure you discuss any questions you have with your health care provider. Exercise to Stay Healthy Exercise helps you become and stay healthy. EXERCISE IDEAS AND TIPS Choose exercises that:  You enjoy.  Fit into your day. You do not need to exercise really hard to be healthy. You can do exercises at a slow or medium level and stay healthy. You can:  Stretch before and after working out.  Try yoga, Pilates, or tai chi.  Lift weights.  Walk fast, swim, jog, run, climb stairs, bicycle, dance, or rollerskate.  Take aerobic classes. Exercises that burn about 150 calories:  Running 1  miles in 15 minutes.  Playing volleyball for 45 to 60 minutes.  Washing and waxing a car for 45 to 60 minutes.  Playing touch football for 45 minutes.  Walking 1  miles in 35 minutes.  Pushing a stroller 1  miles in 30 minutes.  Playing basketball for 30 minutes.  Raking leaves for 30 minutes.  Bicycling 5 miles in 30 minutes.  Walking 2 miles in 30 minutes.  Dancing for 30 minutes.  Shoveling snow for 15 minutes.  Swimming laps for 20 minutes.  Walking up stairs for 15 minutes.  Bicycling 4 miles in 15 minutes.  Gardening for 30 to 45 minutes.  Jumping rope for 15 minutes.  Washing windows or floors for 45 to 60 minutes. Document Released: 05/08/2010 Document Revised: 06/28/2011 Document Reviewed: 05/08/2010 Exit[employer] Patient Information 2015 [employer]. This information is not intended to replace advice given to you by your health care provider. Make sure you discuss any questions you have with your health care provider.  

## 2014-12-13 NOTE — Progress Notes (Signed)
Bryla Burek Holladay 1970/11/01 [id number]    History:    Presents for annual exam.  Regular monthly 3d cycle until June. BTL. Skipped June and July. Had light intermittent spotting for 4-5 days in June. Regular cycle August 8 through the 13th and then started bleeding again August 18 heavy with cramping and clots and bleeding has persisted. 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 Mammogram normal after ultrasound needs a repeat diagnostic bilateral mammogram October 2016..Labs at primary care, normal TSH. Mother colon cancer 2015. History of a benign colon polyp. 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  Past medical history, past surgical history, family history and social history were all reviewed and documented in the EPIC chart. 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 2 children both doing well. History of a kidney stone.   ROS:  A ROS was performed and pertinent positives and negatives are included.  Exam:  Filed Vitals:   12/13/14 1011  BP: 124/82    General appearance:  Normal Thyroid:  Symmetrical, normal in size, without palpable masses or nodularity. Respiratory  Auscultation:  Clear without wheezing or rhonchi Cardiovascular  Auscultation:  Regular rate, without rubs, murmurs or gallops  Edema/varicosities:  Not grossly evident Abdominal  Soft,nontender, without masses, guarding or rebound.  Liver/spleen:  No organomegaly noted  Hernia:  None appreciated  Skin  Inspection:  Grossly normal   Breasts: Examined lying and sitting.     Right: Without masses, retractions, discharge or axillary adenopathy.     Left: Without masses, retractions, discharge or axillary adenopathy. Gentitourinary   Inguinal/mons:  Normal without inguinal adenopathy  External genitalia:  Normal  BUS/Urethra/Skene's glands:  Normal  Vagina:  Copious menses type blood  Cervix:  Normal  Uterus: normal in size, shape and contour.  Midline and mobile  Adnexa/parametria:     Rt: Without masses or tenderness.   Lt: Without masses  or tenderness.  Anus and perineum: Normal  Digital rectal exam: Normal sphincter tone without palpated masses or tenderness  Assessment/Plan:  44 y.o. MBF G2 P2  for annual exam.    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 Toney Rakes, will schedule. Provera 10 mg daily for 10 days, instructed to call if bleeding does not stop. SBE's, keep scheduled follow-up for diagnostic bilateral mammogram October 2016. Encouraged 3-D tomography history of dense breasts after. Exercise, calcium rich diet, vitamin D 1000 daily encouraged. Unable to do Pap today/heavy bleeding.     Huel Cote [facility], 12:52 PM 12/13/2014

## 2014-12-16 ENCOUNTER — Other Ambulatory Visit: Payer: Self-pay | Admitting: Family Medicine

## 2014-12-16 DIAGNOSIS — F900 Attention-deficit hyperactivity disorder, predominantly inattentive type: Secondary | ICD-10-CM

## 2014-12-16 MED ORDER — AMPHETAMINE-DEXTROAMPHET ER 30 MG PO CP24: 30.0000 mg | ORAL_CAPSULE | ORAL | Status: DC

## 2014-12-16 NOTE — Telephone Encounter (Signed)
Pt needing refill on adderall. She has 2 left. Takes 1/day. Please call her when RX is ready @ [phone].

## 2014-12-16 NOTE — Telephone Encounter (Signed)
VM left advising Rx ready for pick up.     KP 

## 2014-12-16 NOTE — Telephone Encounter (Signed)
Patient does not take regularly. Please advise     KP

## 2014-12-17 ENCOUNTER — Other Ambulatory Visit: Payer: Self-pay | Admitting: Gynecology

## 2014-12-17 DIAGNOSIS — N921 Excessive and frequent menstruation with irregular cycle: Secondary | ICD-10-CM

## 2014-12-19 ENCOUNTER — Telehealth: Payer: Self-pay | Admitting: Gynecology

## 2014-12-19 NOTE — Telephone Encounter (Signed)
12/19/14-Pt was advised today that her upcoming sonohysterogram with JF will be covered at 100% since she has already met both her deductible and out of pocket .WL

## 2014-12-24 ENCOUNTER — Telehealth: Payer: Self-pay | Admitting: *Deleted

## 2014-12-24 NOTE — Telephone Encounter (Signed)
If she is minimally bleeding we can do it tomorrow if not rescheduled for next week

## 2014-12-24 NOTE — Telephone Encounter (Signed)
(  Cheryl Patterson) Pt has [facility] scheduled tomorrow started bleeding on Friday changing pads every 2-3 hours. Asked if okay to keep [facility] appointment? Please advise

## 2014-12-24 NOTE — Telephone Encounter (Signed)
Pt aware with the below note, states bleeding is not heavy will keep scheduled appointment.

## 2014-12-25 ENCOUNTER — Ambulatory Visit (INDEPENDENT_AMBULATORY_CARE_PROVIDER_SITE_OTHER): Payer: BLUE CROSS/BLUE SHIELD

## 2014-12-25 ENCOUNTER — Ambulatory Visit (INDEPENDENT_AMBULATORY_CARE_PROVIDER_SITE_OTHER): Payer: BLUE CROSS/BLUE SHIELD | Admitting: Gynecology

## 2014-12-25 ENCOUNTER — Encounter: Payer: Self-pay | Admitting: Gynecology

## 2014-12-25 ENCOUNTER — Other Ambulatory Visit: Payer: Self-pay | Admitting: Gynecology

## 2014-12-25 DIAGNOSIS — D252 Subserosal leiomyoma of uterus: Secondary | ICD-10-CM | POA: Diagnosis not present

## 2014-12-25 DIAGNOSIS — R938 Abnormal findings on diagnostic imaging of other specified body structures: Secondary | ICD-10-CM | POA: Diagnosis not present

## 2014-12-25 DIAGNOSIS — D251 Intramural leiomyoma of uterus: Secondary | ICD-10-CM | POA: Diagnosis not present

## 2014-12-25 DIAGNOSIS — N921 Excessive and frequent menstruation with irregular cycle: Secondary | ICD-10-CM

## 2014-12-25 DIAGNOSIS — R9389 Abnormal findings on diagnostic imaging of other specified body structures: Secondary | ICD-10-CM

## 2014-12-25 DIAGNOSIS — N83201 Unspecified ovarian cyst, right side: Secondary | ICD-10-CM

## 2014-12-25 DIAGNOSIS — N8320 Unspecified ovarian cysts: Secondary | ICD-10-CM

## 2014-12-25 DIAGNOSIS — N938 Other specified abnormal uterine and vaginal bleeding: Secondary | ICD-10-CM

## 2014-12-25 MED ORDER — MEGESTROL ACETATE 40 MG PO TABS: ORAL_TABLET | ORAL | Status: DC

## 2014-12-25 NOTE — Patient Instructions (Signed)

## 2014-12-25 NOTE — Progress Notes (Signed)
   Patient is a 44 year old with previous tubal sterilization procedure presented to the office today for a sonohysterogram and endometrial biopsy as a result of her dysfunctional uterine bleeding. Patient was seen at the end of August by our nurse practitioner for annual exam and was reporting that her menstrual cycles were regular until May. She skipped the month of June and July and that she's had intermittent spotting for 4-5 days in June and irregular cycle in August 8 and had been bleeding since then. She denied any cramping. She does have a history of 2005 over LEEP cervical conization for CIN-3 and her Pap smear was normal in 2015.  Patient was counseled for sonohysterogram and endometrial biopsy today. She had been on Provera 10 mg daily but continues to bleed when she finished the Provera.Marland Kitchen  Ultrasound today demonstrated the following: Uterus measured 10.5 x 7.9 x 6.8 cm with endometrial stripe 11.2 mm. Patient with several intramural fibroids the largest one measuring 44 x 36 mm. Right ovary had a thinwall echo-free follicle measured 14 x 18 mm the second 19 x 26 x 14 mm. Aphakic corpus luteum cyst was noted measuring 19 x 9 mm. Left ovary was normal. No fluid in the cul-de-sac. Thickened endometrium. The cervix was then cleansed with Betadine solution a sterile Pipelle was introduced into the uterine cavity normal saline was instilled. No intracavitary defect was noted. After this endometrial biopsy was accomplished with a sterile Pipelle. Uterus measured 8 cm.  Assessment/plan: Patient with dysfunction uterine bleeding sonohysterogram with no intracavitary defects. Ultrasound demonstrated several intramural myomas. Endometrial biopsy obtained results pending at time of this dictation. Patient will return back to the office next week to discuss results and plan a course of management. Literature information on endometrial ablation as well as Mirena IUD was provided. She will be placed on Megace  40 mg one by mouth 3 times a day first 2-3 days to stop her bleeding then she will continue will one tablet twice a day for 2 weeks. A CBC will be checked today.

## 2015-01-01 ENCOUNTER — Ambulatory Visit (INDEPENDENT_AMBULATORY_CARE_PROVIDER_SITE_OTHER): Payer: BLUE CROSS/BLUE SHIELD | Admitting: Gynecology

## 2015-01-01 ENCOUNTER — Encounter: Payer: Self-pay | Admitting: Gynecology

## 2015-01-01 VITALS — BP 132/78

## 2015-01-01 DIAGNOSIS — N393 Stress incontinence (female) (male): Secondary | ICD-10-CM | POA: Diagnosis not present

## 2015-01-01 DIAGNOSIS — N938 Other specified abnormal uterine and vaginal bleeding: Secondary | ICD-10-CM

## 2015-01-01 DIAGNOSIS — R140 Abdominal distension (gaseous): Secondary | ICD-10-CM | POA: Diagnosis not present

## 2015-01-01 DIAGNOSIS — D251 Intramural leiomyoma of uterus: Secondary | ICD-10-CM

## 2015-01-01 NOTE — Progress Notes (Signed)
Patient is a 44 year old who presented to the office today for further discussion and management as a result of her leiomyomatous uteri, dysfunctional bleeding, bloating and now reporting stress urinary incontinence whereby she has to wear a pad. Her record indicated from the last visit the following:  "Patient with previous tubal sterilization procedure presented to the office today for a sonohysterogram and endometrial biopsy as a result of her dysfunctional uterine bleeding. Patient was seen at the end of August by our nurse practitioner for annual exam and was reporting that her menstrual cycles were regular until May. She skipped the month of June and July and that she's had intermittent spotting for 4-5 days in June and irregular cycle in August 8 and had been bleeding since then. She denied any cramping. She does have a history of 2005 over LEEP cervical conization for CIN-3 and her Pap smear was normal in 2015.  Patient was counseled for sonohysterogram and endometrial biopsy today. She had been on Provera 10 mg daily but continues to bleed when she finished the Provera.Marland Kitchen  Ultrasound today demonstrated the following: Uterus measured 10.5 x 7.9 x 6.8 cm with endometrial stripe 11.2 mm. Patient with several intramural fibroids the largest one measuring 44 x 36 mm. Right ovary had a thinwall echo-free follicle measured 14 x 18 mm the second 19 x 26 x 14 mm. Aphakic corpus luteum cyst was noted measuring 19 x 9 mm. Left ovary was normal. No fluid in the cul-de-sac. Thickened endometrium. The cervix was then cleansed with Betadine solution a sterile Pipelle was introduced into the uterine cavity normal saline was instilled. No intracavitary defect was noted. After this endometrial biopsy was accomplished with a sterile Pipelle. Uterus measured 8 cm."  Her pathology report demonstrated the following: Diagnosis Endometrium, biopsy, uterus - DEGENERATING SECRETORY-TYPE ENDOMETRIUM, SEE  COMMENT Microscopic Comment This pattern can be associated with menses, irregular shedding or breakthrough bleeding associated with hormonal therapy. Clinical correlation is suggested.  Her CBC this year demonstrated hemoglobin 13.2  Patient recently was prescribed Megace 40 mg twice a day for 10 days to stop her bleeding and it has stopped her bleeding.  Exam: Blood pressure 130/78 Gen. appearance well-developed well-nourished female with complaint of irregular bleeding and stress urinary incontinence and abdominal pelvic pressure Abdomen: Soft nontender no rebound or guarding Pelvic exam: Bartholin urethra Skene glands within normal limits Vagina dark blood was present. No evidence of cystocele or rectocele Cervix: No gross lesions on inspection Uterus 10 cm size slightly irregular Adnexa no palpable masses or tenderness Rectal exam not done  As a result of patient's complaint of stress urinary incontinence she was examined in the supine and erect position. Upon Valsalva she did not leak urine. There was no evidence of uterine prolapse, cystocele, enterocele, or rectocele. A Q-tip urethral vesical angle change was noted right at 30 indicating mild urethral hypermobility.  Assessment/plan: #1 fibroid uterus #2 dysfunctional uterine bleeding as a result of #1 recent benign endometrial biopsy #3 stress urinary incontinence with borderline urethral hypermobility no cystocele, rectocele, enterocele, or uterine prolapse. I'm going to refer to the urologist for urodynamic testing and if they feel that she needs to be with a sling perhaps we can coordinate together my recommendation which is to proceed with a transvaginal hysterectomy and bilateral salpingectomy and concurrently if needed they could do the sling procedure for urinary incontinence. Patient did mention that several years ago she was given medication for her incontinence but she took it for a  very short time.  Time of consultation  30 minutes

## 2015-01-01 NOTE — Patient Instructions (Signed)
Laparoscopically Assisted Vaginal Hysterectomy  A laparoscopically assisted vaginal hysterectomy (LAVH) is a surgical procedure to remove the uterus and cervix, and sometimes the ovaries and fallopian tubes. During an LAVH, some of the surgical removal is done through the vagina, and the rest is done through a few small surgical cuts (incisions) in the abdomen.  This procedure is usually considered in women when a vaginal hysterectomy is not an option. Your health care provider will discuss the risks and benefits of the different surgical techniques at your appointment. Generally, recovery time is faster and there are fewer complications after laparoscopic procedures than after open incisional procedures. LET [facility] CARE PROVIDER KNOW ABOUT:   Any allergies you have.  All medicines you are taking, including vitamins, herbs, eye drops, creams, and over-the-counter medicines.  Previous problems you or members of your family have had with the use of anesthetics.  Any blood disorders you have.  Previous surgeries you have had.  Medical conditions you have. RISKS AND COMPLICATIONS Generally, this is a safe procedure. However, as with any procedure, complications can occur. Possible complications include:  Allergies to medicines.  Difficulty breathing.  Bleeding.  Infection.  Damage to other structures near your uterus and cervix. BEFORE THE PROCEDURE  Ask your health care provider about changing or stopping your regular medicines.  Take certain medicines, such as a colon-emptying preparation, as directed.  Do not eat or drink anything for at least 8 hours before your surgery.  Stop smoking if you smoke. Stopping will improve your health after surgery.  Arrange for a ride home after surgery and for help at home during recovery. PROCEDURE   An IV tube will be put into one of your veins in order to give you fluids and medicines.  You will receive medicines to relax you and  medicines that make you sleep (general anesthetic).  You may have a flexible tube (catheter) put into your bladder to drain urine.  You may have a tube put through your nose or mouth that goes into your stomach (nasogastric tube). The nasogastric tube removes digestive fluids and prevents you from feeling nauseated and from vomiting.  Tight-fitting (compression) stockings will be placed on your legs to promote circulation.  Three to four small incisions will be made in your abdomen. An incision also will be made in your vagina. Probes and tools will be inserted into the small incisions. The uterus and cervix are removed (and possibly your ovaries and fallopian tubes) through your vagina as well as through the small incisions that were made in the abdomen.  Your vagina is then sewn back to normal. AFTER THE PROCEDURE  You may have a liquid diet temporarily. You will most likely return to, and tolerate, your usual diet the day after surgery.  You will be passing urine through a catheter. It will be removed the day after surgery.  Your temperature, breathing rate, heart rate, blood pressure, and oxygen level will be monitored regularly.  You will still wear compression stockings on your legs until you are able to move around.  You will use a special device or do breathing exercises to keep your lungs clear.  You will be encouraged to walk as soon as possible. Document Released: 03/25/2011 Document Revised: 12/06/2012 Document Reviewed: 10/19/2012 [facility] Patient Information 2015 [place], [place]. This information is not intended to replace advice given to you by your health care provider. Make sure you discuss any questions you have with your health care provider. Uterine Fibroid A  uterine fibroid is a growth (tumor) that occurs in your uterus. This type of tumor is not cancerous and does not spread out of the uterus. You can have one or many fibroids. Fibroids can vary in size, weight, and  where they grow in the uterus. Some can become quite large. Most fibroids do not require medical treatment, but some can cause pain or heavy bleeding during and between periods. CAUSES  A fibroid is the result of a single uterine cell that keeps growing (unregulated), which is different than most cells in the human body. Most cells have a control mechanism that keeps them from reproducing without control.  SIGNS AND SYMPTOMS   Bleeding.  Pelvic pain and pressure.  Bladder problems due to the size of the fibroid.  Infertility and miscarriages depending on the size and location of the fibroid. DIAGNOSIS  Uterine fibroids are diagnosed through a physical exam. Your health care provider may feel the lumpy tumors during a pelvic exam. Ultrasonography may be done to get information regarding size, location, and number of tumors.  TREATMENT   Your health care provider may recommend watchful waiting. This involves getting the fibroid checked by your health care provider to see if it grows or shrinks.   Hormone treatment or an intrauterine device (IUD) may be prescribed.   Surgery may be needed to remove the fibroids (myomectomy) or the uterus (hysterectomy). This depends on your situation. When fibroids interfere with fertility and a woman wants to become pregnant, a health care provider may recommend having the fibroids removed.  [place] care depends on how you were treated. In general:   Keep all follow-up appointments with your health care provider.   Only take over-the-counter or prescription medicines as directed by your health care provider. If you were prescribed a hormone treatment, take the hormone medicines exactly as directed. Do not take aspirin. It can cause bleeding.   Talk to your health care provider about taking iron pills.  If your periods are troublesome but not so heavy, lie down with your feet raised slightly above your heart. Place cold packs on  your lower abdomen.   If your periods are heavy, write down the number of pads or tampons you use per month. Bring this information to your health care provider.   Include green vegetables in your diet.  SEEK IMMEDIATE MEDICAL CARE IF:  You have pelvic pain or cramps not controlled with medicines.   You have a sudden increase in pelvic pain.   You have an increase in bleeding between and during periods.   You have excessive periods and soak tampons or pads in a half hour or less.  You feel lightheaded or have fainting episodes. Document Released: 04/02/2000 Document Revised: 01/24/2013 Document Reviewed: 11/02/2012 [facility] Patient Information 2015 [place], [place]. This information is not intended to replace advice given to you by your health care provider. Make sure you discuss any questions you have with your health care provider. Urodynamic Testing If you have a problem with urine leakage, urodynamic testing may be useful. This non-invasive test will be helpful to find out the cause of the problem. Problems in the urinary system can be caused by aging, illness, or injury. The muscles in your ureters, bladder, and urethra can become weaker with age. You may have more urinary infections and bladder stones because of the weakened bladder muscles. The muscles may not empty your bladder completely. Also, weakening of the urethral sphincter and the muscles of  the pelvic floor can cause stress urinary continence. This is because the sphincter cannot remain tight enough to hold urine in the bladder or does not have enough support from the pelvic muscles. Urodynamics is the study of how the body stores and releases urine. These tests help your caregiver see how well your bladder and sphincter muscles work. The tests can help explain symptoms such as:  Inability to control your urine (incontinence).  Sudden, strong urges to urinate.  Painful urination.  Recurrent urinary tract  infections.  Frequent urination.  Problems starting a urine stream.  Problems emptying your bladder completely. PREPARATION FOR TEST If your caregiver recommends bladder testing, usually no special preparations are needed. Make sure you understand any instructions you receive. Depending on the test, you may be asked to come with a full bladder or an empty one. Also, ask whether you should change your diet or skip your regular medicines and for how long. TAKING THE TEST Any procedure designed to provide information about a bladder problem can be called a urodynamic test. Your caregiver will want to know whether:  You have difficulty starting a urine stream.  How hard you have to strain to maintain it.  The stream is interrupted.  Any urine is left in your bladder when you are done (post void residual). Urodynamic tests can range from simple observation to precise measurement using instruments. TESTING METHODS The type of test you take depends on your problem. The different tests are described below.  The use of imaging equipment. This equipment films urination.  Urinating behind a curtain while a doctor or nurse listens.  If leakage is the problem, a pad test is a simple way to measure how much urine seeps out. You will be given a number of absorbent pads and plastic bags. You will be told to wear the pad for 1 or 2 hours and then seal it in a bag. Your caregiver will then weigh the bags to see how much urine has been caught in the pad. A simpler, but less precise method is to change pads as often as you need to and keep track of how many pads you use in a day.  A physical exam will also be performed to rule out other causes of urinary problems. Other causes could include weakening pelvic muscles (pelvic prolapse) or prostate enlargement.  Pressure Flow Study-you will empty your bladder so a catheter can measure the pressures required to urinate. This study helps to identify causes of  bladder outlet obstruction that men may experience with prostate enlargement. Bladder outlet obstruction is less common in women but can occur with a fallen bladder or rarely after a surgical procedure for urinary incontinence.  Electromyography (Measurement of Nerve Impulses)-If your caregiver thinks that your urinary problem is related to nerve damage, you may be given an electromyography. This test measures the muscle activity in the urethral sphincter. It uses sensors placed on the skin near the urethra and rectum. Muscle activity is recorded on a machine. The impulse patterns show if the messages sent to the bladder and urethra are coordinated correctly.  Video Urodynamics-can be performed with or without equipment to take pictures of the bladder during filling and emptying. The imaging equipment may use X-rays or sound waves. If X-ray equipment is used, the liquid used to fill the bladder may be a contrast medium that will show up on the X-ray. The pictures and videos show the size and shape of the urinary tract and help your  caregiver understand your problem. AFTER THE TEST You may have mild discomfort for a few hours after these tests. Drinking two, 8-ounce glasses of water, each hour for 2 hours should help. Ask your caregiver if you can take a warm bath. If not, you may be able to hold a warm, damp washcloth over the urethral opening. This may relieve discomfort. You may be given an antibiotic to prevent an infection. Call your caregiver if you have signs of infection. These signs include pain, chills, or fever. OBTAINING THE TEST RESULTS Results for simple tests can be discussed with your caregiver immediately after the test. Results of other tests may take a few days. You will have time to ask questions about the results and possible treatments for your problem. It is your responsibility to obtain your test results. Ask the lab or department performing the test when and how you will get your  results. FOR MORE INFORMATION  American Urological Association: www.auanet.[place] for Urologic Disease: www.afud.org Interstitial Cystitis Association of America: [place].it National Kidney and Urologic Diseases Information Clearinghouse: nkudic@info .AmenCredit.is Document Released: 01/31/2007 Document Revised: 06/28/2011 Document Reviewed: 07/16/2013 [employer] Patient Information 2015 [place], [place]. This information is not intended to replace advice given to you by your health care provider. Make sure you discuss any questions you have with your health care provider. Urinary Incontinence Urinary incontinence is the involuntary loss of urine from your bladder. CAUSES  There are many causes of urinary incontinence. They include:  Medicines.  Infections.  Prostatic enlargement, leading to overflow of urine from your bladder.  Surgery.  Neurological diseases.  Emotional factors. SIGNS AND SYMPTOMS Urinary Incontinence can be divided into four types:  Urge incontinence. Urge incontinence is the involuntary loss of urine before you have the opportunity to go to the bathroom. There is a sudden urge to void but not enough time to reach a bathroom.  Stress incontinence. Stress incontinence is the sudden loss of urine with any activity that forces urine to pass. It is commonly caused by anatomical changes to the pelvis and sphincter areas of your body.  Overflow incontinence. Overflow incontinence is the loss of urine from an obstructed opening to your bladder. This results in a backup of urine and a resultant buildup of pressure within the bladder. When the pressure within the bladder exceeds the closing pressure of the sphincter, the urine overflows, which causes incontinence, similar to water overflowing a dam.  Total incontinence. Total incontinence is the loss of urine as a result of the inability to store urine within your bladder. DIAGNOSIS  Evaluating the cause  of incontinence may require:  A thorough and complete medical and obstetric history.  A complete physical exam.  Laboratory tests such as a urine culture and sensitivities. When additional tests are indicated, they can include:  An ultrasound exam.  Kidney and bladder X-rays.  Cystoscopy. This is an exam of the bladder using a narrow scope.  Urodynamic testing to test the nerve function to the bladder and sphincter areas. TREATMENT  Treatment for urinary incontinence depends on the cause:  For urge incontinence caused by a bacterial infection, antibiotics will be prescribed. If the urge incontinence is related to medicines you take, your health care provider may have you change the medicine.  For stress incontinence, surgery to re-establish anatomical support to the bladder or sphincter, or both, will often correct the condition.  For overflow incontinence caused by an enlarged prostate, an operation to open the channel through the enlarged prostate will  allow the flow of urine out of the bladder. In women with fibroids, a hysterectomy may be recommended.  For total incontinence, surgery on your urinary sphincter may help. An artificial urinary sphincter (an inflatable cuff placed around the urethra) may be required. In women who have developed a hole-like passage between their bladder and vagina (vesicovaginal fistula), surgery to close the fistula often is required. HOME CARE INSTRUCTIONS  Normal daily hygiene and the use of pads or adult diapers that are changed regularly will help prevent odors and skin damage.  Avoid caffeine. It can overstimulate your bladder.  Use the bathroom regularly. Try about every 2-3 hours to go to the bathroom, even if you do not feel the need to do so. Take time to empty your bladder completely. After urinating, wait a minute. Then try to urinate again.  For causes involving nerve dysfunction, keep a log of the medicines you take and a journal of the  times you go to the bathroom. SEEK MEDICAL CARE IF:  You experience worsening of pain instead of improvement in pain after your procedure.  Your incontinence becomes worse instead of better. SEE IMMEDIATE MEDICAL CARE IF:  You experience fever or shaking chills.  You are unable to pass your urine.  You have redness spreading into your groin or down into your thighs. MAKE SURE YOU:   Understand these instructions.   Will watch your condition.  Will get help right away if you are not doing well or get worse. Document Released: 05/13/2004 Document Revised: 01/24/2013 Document Reviewed: 09/12/2012 [facility] Patient Information 2015 [place], [place]. This information is not intended to replace advice given to you by your health care provider. Make sure you discuss any questions you have with your health care provider.

## 2015-01-02 ENCOUNTER — Telehealth: Payer: Self-pay | Admitting: *Deleted

## 2015-01-02 NOTE — Telephone Encounter (Signed)
Referral faxed to [facility] they will contact pt to schedule.

## 2015-01-02 NOTE — Telephone Encounter (Signed)
-----   Message from Terrance Mass, MD sent at 01/01/2015  3:50 PM EDT ----- [facility] please make an appointment for this patient with Dr. Arther Dames or Dr. Gaynelle Arabian. Please send him a copy of my office note from today.

## 2015-01-06 NOTE — Telephone Encounter (Signed)
Appointment on 02/10/15 2 8:30am with Dr.MacDiarmid pt aware.

## 2015-01-08 ENCOUNTER — Telehealth: Payer: Self-pay | Admitting: Behavioral Health

## 2015-01-08 NOTE — Telephone Encounter (Signed)
Unable to reach patient at time of Pre-Visit Call.  Left message for patient to return call when available.    

## 2015-01-10 ENCOUNTER — Ambulatory Visit (INDEPENDENT_AMBULATORY_CARE_PROVIDER_SITE_OTHER): Payer: BLUE CROSS/BLUE SHIELD | Admitting: Family Medicine

## 2015-01-10 ENCOUNTER — Encounter: Payer: Self-pay | Admitting: Family Medicine

## 2015-01-10 VITALS — BP 120/76 | HR 75 | Temp 98.4°F | Ht 67.0 in | Wt 201.8 lb

## 2015-01-10 DIAGNOSIS — J4520 Mild intermittent asthma, uncomplicated: Secondary | ICD-10-CM

## 2015-01-10 DIAGNOSIS — R8290 Unspecified abnormal findings in urine: Secondary | ICD-10-CM | POA: Diagnosis not present

## 2015-01-10 DIAGNOSIS — Z0000 Encounter for general adult medical examination without abnormal findings: Secondary | ICD-10-CM

## 2015-01-10 DIAGNOSIS — R319 Hematuria, unspecified: Secondary | ICD-10-CM

## 2015-01-10 DIAGNOSIS — F900 Attention-deficit hyperactivity disorder, predominantly inattentive type: Secondary | ICD-10-CM | POA: Diagnosis not present

## 2015-01-10 LAB — POCT URINALYSIS DIPSTICK
Bilirubin, UA: NEGATIVE
GLUCOSE UA: NEGATIVE
KETONES UA: NEGATIVE
Leukocytes, UA: NEGATIVE
Nitrite, UA: NEGATIVE
PROTEIN UA: NEGATIVE
SPEC GRAV UA: 1.025
UROBILINOGEN UA: 0.2
pH, UA: 6

## 2015-01-10 LAB — CBC WITH DIFFERENTIAL/PLATELET
BASOS PCT: 0.4 % (ref 0.0–3.0)
Basophils Absolute: 0 10*3/uL (ref 0.0–0.1)
EOS PCT: 1.5 % (ref 0.0–5.0)
Eosinophils Absolute: 0.1 10*3/uL (ref 0.0–0.7)
HCT: 38.7 % (ref 36.0–46.0)
HEMOGLOBIN: 12.9 g/dL (ref 12.0–15.0)
LYMPHS ABS: 2.3 10*3/uL (ref 0.7–4.0)
Lymphocytes Relative: 34.4 % (ref 12.0–46.0)
MCHC: 33.3 g/dL (ref 30.0–36.0)
MCV: 87.7 fl (ref 78.0–100.0)
MONOS PCT: 4.1 % (ref 3.0–12.0)
Monocytes Absolute: 0.3 10*3/uL (ref 0.1–1.0)
Neutro Abs: 3.9 10*3/uL (ref 1.4–7.7)
Neutrophils Relative %: 59.6 % (ref 43.0–77.0)
Platelets: 304 10*3/uL (ref 150.0–400.0)
RBC: 4.41 Mil/uL (ref 3.87–5.11)
RDW: 13.8 % (ref 11.5–15.5)
WBC: 6.6 10*3/uL (ref 4.0–10.5)

## 2015-01-10 LAB — COMPREHENSIVE METABOLIC PANEL
ALT: 12 U/L (ref 0–35)
AST: 13 U/L (ref 0–37)
Albumin: 4.3 g/dL (ref 3.5–5.2)
Alkaline Phosphatase: 74 U/L (ref 39–117)
BUN: 14 mg/dL (ref 6–23)
CHLORIDE: 104 meq/L (ref 96–112)
CO2: 26 meq/L (ref 19–32)
CREATININE: 0.89 mg/dL (ref 0.40–1.20)
Calcium: 9.7 mg/dL (ref 8.4–10.5)
GFR: 88.32 mL/min (ref >60.00)
Glucose, Bld: 87 mg/dL (ref 70–99)
POTASSIUM: 4.6 meq/L (ref 3.5–5.1)
SODIUM: 139 meq/L (ref 135–145)
Total Bilirubin: 0.3 mg/dL (ref 0.2–1.2)
Total Protein: 7.7 g/dL (ref 6.0–8.3)

## 2015-01-10 LAB — LIPID PANEL
CHOL/HDL RATIO: 5
Cholesterol: 190 mg/dL (ref 0–200)
HDL: 40.2 mg/dL (ref >39.00)
LDL CALC: 139 mg/dL — AB (ref 0–99)
NonHDL: 149.76
Triglycerides: 52 mg/dL (ref 0.0–149.0)
VLDL: 10.4 mg/dL (ref 0.0–40.0)

## 2015-01-10 LAB — TSH: TSH: 1.13 u[IU]/mL (ref 0.35–4.50)

## 2015-01-10 MED ORDER — ALBUTEROL SULFATE HFA 108 (90 BASE) MCG/ACT IN AERS: 2.0000 | INHALATION_SPRAY | Freq: Four times a day (QID) | RESPIRATORY_TRACT | Status: DC | PRN

## 2015-01-10 MED ORDER — AMPHETAMINE-DEXTROAMPHET ER 30 MG PO CP24: 30.0000 mg | ORAL_CAPSULE | ORAL | Status: DC

## 2015-01-10 NOTE — Addendum Note (Signed)
Addended by: Harl Bowie on: 01/10/2015 11:11 AM   Modules accepted: Orders

## 2015-01-10 NOTE — Patient Instructions (Signed)
Preventive Care for Adults A healthy lifestyle and preventive care can promote health and wellness. Preventive health guidelines for women include the following key practices.  A routine yearly physical is a good way to check with your health care provider about your health and preventive screening. It is a chance to share any concerns and updates on your health and to receive a thorough exam.  Visit your dentist for a routine exam and preventive care every 6 months. Brush your teeth twice a day and floss once a day. Good oral hygiene prevents tooth decay and gum disease.  The frequency of eye exams is based on your age, health, family medical history, use of contact lenses, and other factors. Follow your health care provider's recommendations for frequency of eye exams.  Eat a healthy diet. Foods like vegetables, fruits, whole grains, low-fat dairy products, and lean protein foods contain the nutrients you need without too many calories. Decrease your intake of foods high in solid fats, added sugars, and salt. Eat the right amount of calories for you.Get information about a proper diet from your health care provider, if necessary.  Regular physical exercise is one of the most important things you can do for your health. Most adults should get at least 150 minutes of moderate-intensity exercise (any activity that increases your heart rate and causes you to sweat) each week. In addition, most adults need muscle-strengthening exercises on 2 or more days a week.  Maintain a healthy weight. The body mass index (BMI) is a screening tool to identify possible weight problems. It provides an estimate of body fat based on height and weight. Your health care provider can find your BMI and can help you achieve or maintain a healthy weight.For adults 20 years and older:  A BMI below 18.5 is considered underweight.  A BMI of 18.5 to 24.9 is normal.  A BMI of 25 to 29.9 is considered overweight.  A BMI of  30 and above is considered obese.  Maintain normal blood lipids and cholesterol levels by exercising and minimizing your intake of saturated fat. Eat a balanced diet with plenty of fruit and vegetables. Blood tests for lipids and cholesterol should begin at age 76 and be repeated every 5 years. If your lipid or cholesterol levels are high, you are over 50, or you are at high risk for heart disease, you may need your cholesterol levels checked more frequently.Ongoing high lipid and cholesterol levels should be treated with medicines if diet and exercise are not working.  If you smoke, find out from your health care provider how to quit. If you do not use tobacco, do not start.  Lung cancer screening is recommended for adults aged 22-80 years who are at high risk for developing lung cancer because of a history of smoking. A yearly low-dose CT scan of the lungs is recommended for people who have at least a 30-pack-year history of smoking and are a current smoker or have quit within the past 15 years. A pack year of smoking is smoking an average of 1 pack of cigarettes a day for 1 year (for example: 1 pack a day for 30 years or 2 packs a day for 15 years). Yearly screening should continue until the smoker has stopped smoking for at least 15 years. Yearly screening should be stopped for people who develop a health problem that would prevent them from having lung cancer treatment.  If you are pregnant, do not drink alcohol. If you are breastfeeding,  be very cautious about drinking alcohol. If you are not pregnant and choose to drink alcohol, do not have more than 1 drink per day. One drink is considered to be 12 ounces (355 mL) of beer, 5 ounces (148 mL) of wine, or 1.5 ounces (44 mL) of liquor.  Avoid use of street drugs. Do not share needles with anyone. Ask for help if you need support or instructions about stopping the use of drugs.  High blood pressure causes heart disease and increases the risk of  stroke. Your blood pressure should be checked at least every 1 to 2 years. Ongoing high blood pressure should be treated with medicines if weight loss and exercise do not work.  If you are 75-52 years old, ask your health care provider if you should take aspirin to prevent strokes.  Diabetes screening involves taking a blood sample to check your fasting blood sugar level. This should be done once every 3 years, after age 15, if you are within normal weight and without risk factors for diabetes. Testing should be considered at a younger age or be carried out more frequently if you are overweight and have at least 1 risk factor for diabetes.  Breast cancer screening is essential preventive care for women. You should practice "breast self-awareness." This means understanding the normal appearance and feel of your breasts and may include breast self-examination. Any changes detected, no matter how small, should be reported to a health care provider. Women in their 58s and 30s should have a clinical breast exam (CBE) by a health care provider as part of a regular health exam every 1 to 3 years. After age 16, women should have a CBE every year. Starting at age 53, women should consider having a mammogram (breast X-ray test) every year. Women who have a family history of breast cancer should talk to their health care provider about genetic screening. Women at a high risk of breast cancer should talk to their health care providers about having an MRI and a mammogram every year.  Breast cancer gene (BRCA)-related cancer risk assessment is recommended for women who have family members with BRCA-related cancers. BRCA-related cancers include breast, ovarian, tubal, and peritoneal cancers. Having family members with these cancers may be associated with an increased risk for harmful changes (mutations) in the breast cancer genes BRCA1 and BRCA2. Results of the assessment will determine the need for genetic counseling and  BRCA1 and BRCA2 testing.  Routine pelvic exams to screen for cancer are no longer recommended for nonpregnant women who are considered low risk for cancer of the pelvic organs (ovaries, uterus, and vagina) and who do not have symptoms. Ask your health care provider if a screening pelvic exam is right for you.  If you have had past treatment for cervical cancer or a condition that could lead to cancer, you need Pap tests and screening for cancer for at least 20 years after your treatment. If Pap tests have been discontinued, your risk factors (such as having a new sexual partner) need to be reassessed to determine if screening should be resumed. Some women have medical problems that increase the chance of getting cervical cancer. In these cases, your health care provider may recommend more frequent screening and Pap tests.  The HPV test is an additional test that may be used for cervical cancer screening. The HPV test looks for the virus that can cause the cell changes on the cervix. The cells collected during the Pap test can be  tested for HPV. The HPV test could be used to screen women aged 30 years and older, and should be used in women of any age who have unclear Pap test results. After the age of 30, women should have HPV testing at the same frequency as a Pap test.  Colorectal cancer can be detected and often prevented. Most routine colorectal cancer screening begins at the age of 50 years and continues through age 75 years. However, your health care provider may recommend screening at an earlier age if you have risk factors for colon cancer. On a yearly basis, your health care provider may provide home test kits to check for hidden blood in the stool. Use of a small camera at the end of a tube, to directly examine the colon (sigmoidoscopy or colonoscopy), can detect the earliest forms of colorectal cancer. Talk to your health care provider about this at age 50, when routine screening begins. Direct  exam of the colon should be repeated every 5-10 years through age 75 years, unless early forms of pre-cancerous polyps or small growths are found.  People who are at an increased risk for hepatitis B should be screened for this virus. You are considered at high risk for hepatitis B if:  You were born in a country where hepatitis B occurs often. Talk with your health care provider about which countries are considered high risk.  Your parents were born in a high-risk country and you have not received a shot to protect against hepatitis B (hepatitis B vaccine).  You have HIV or AIDS.  You use needles to inject street drugs.  You live with, or have sex with, someone who has hepatitis B.  You get hemodialysis treatment.  You take certain medicines for conditions like cancer, organ transplantation, and autoimmune conditions.  Hepatitis C blood testing is recommended for all people born from 1945 through 1965 and any individual with known risks for hepatitis C.  Practice safe sex. Use condoms and avoid high-risk sexual practices to reduce the spread of sexually transmitted infections (STIs). STIs include gonorrhea, chlamydia, syphilis, trichomonas, herpes, HPV, and human immunodeficiency virus (HIV). Herpes, HIV, and HPV are viral illnesses that have no cure. They can result in disability, cancer, and death.  You should be screened for sexually transmitted illnesses (STIs) including gonorrhea and chlamydia if:  You are sexually active and are younger than 24 years.  You are older than 24 years and your health care provider tells you that you are at risk for this type of infection.  Your sexual activity has changed since you were last screened and you are at an increased risk for chlamydia or gonorrhea. Ask your health care provider if you are at risk.  If you are at risk of being infected with HIV, it is recommended that you take a prescription medicine daily to prevent HIV infection. This is  called preexposure prophylaxis (PrEP). You are considered at risk if:  You are a heterosexual woman, are sexually active, and are at increased risk for HIV infection.  You take drugs by injection.  You are sexually active with a partner who has HIV.  Talk with your health care provider about whether you are at high risk of being infected with HIV. If you choose to begin PrEP, you should first be tested for HIV. You should then be tested every 3 months for as long as you are taking PrEP.  Osteoporosis is a disease in which the bones lose minerals and strength   with aging. This can result in serious bone fractures or breaks. The risk of osteoporosis can be identified using a bone density scan. Women ages 65 years and over and women at risk for fractures or osteoporosis should discuss screening with their health care providers. Ask your health care provider whether you should take a calcium supplement or vitamin D to reduce the rate of osteoporosis.  Menopause can be associated with physical symptoms and risks. Hormone replacement therapy is available to decrease symptoms and risks. You should talk to your health care provider about whether hormone replacement therapy is right for you.  Use sunscreen. Apply sunscreen liberally and repeatedly throughout the day. You should seek shade when your shadow is shorter than you. Protect yourself by wearing long sleeves, pants, a wide-brimmed hat, and sunglasses year round, whenever you are outdoors.  Once a month, do a whole body skin exam, using a mirror to look at the skin on your back. Tell your health care provider of new moles, moles that have irregular borders, moles that are larger than a pencil eraser, or moles that have changed in shape or color.  Stay current with required vaccines (immunizations).  Influenza vaccine. All adults should be immunized every year.  Tetanus, diphtheria, and acellular pertussis (Td, Tdap) vaccine. Pregnant women should  receive 1 dose of Tdap vaccine during each pregnancy. The dose should be obtained regardless of the length of time since the last dose. Immunization is preferred during the 27th-36th week of gestation. An adult who has not previously received Tdap or who does not know her vaccine status should receive 1 dose of Tdap. This initial dose should be followed by tetanus and diphtheria toxoids (Td) booster doses every 10 years. Adults with an unknown or incomplete history of completing a 3-dose immunization series with Td-containing vaccines should begin or complete a primary immunization series including a Tdap dose. Adults should receive a Td booster every 10 years.  Varicella vaccine. An adult without evidence of immunity to varicella should receive 2 doses or a second dose if she has previously received 1 dose. Pregnant females who do not have evidence of immunity should receive the first dose after pregnancy. This first dose should be obtained before leaving the health care facility. The second dose should be obtained 4-8 weeks after the first dose.  Human papillomavirus (HPV) vaccine. Females aged 13-26 years who have not received the vaccine previously should obtain the 3-dose series. The vaccine is not recommended for use in pregnant females. However, pregnancy testing is not needed before receiving a dose. If a female is found to be pregnant after receiving a dose, no treatment is needed. In that case, the remaining doses should be delayed until after the pregnancy. Immunization is recommended for any person with an immunocompromised condition through the age of 26 years if she did not get any or all doses earlier. During the 3-dose series, the second dose should be obtained 4-8 weeks after the first dose. The third dose should be obtained 24 weeks after the first dose and 16 weeks after the second dose.  Zoster vaccine. One dose is recommended for adults aged 60 years or older unless certain conditions are  present.  Measles, mumps, and rubella (MMR) vaccine. Adults born before 1957 generally are considered immune to measles and mumps. Adults born in 1957 or later should have 1 or more doses of MMR vaccine unless there is a contraindication to the vaccine or there is laboratory evidence of immunity to   each of the three diseases. A routine second dose of MMR vaccine should be obtained at least 28 days after the first dose for students attending postsecondary schools, health care workers, or international travelers. People who received inactivated measles vaccine or an unknown type of measles vaccine during 1963-1967 should receive 2 doses of MMR vaccine. People who received inactivated mumps vaccine or an unknown type of mumps vaccine before 1979 and are at high risk for mumps infection should consider immunization with 2 doses of MMR vaccine. For females of childbearing age, rubella immunity should be determined. If there is no evidence of immunity, females who are not pregnant should be vaccinated. If there is no evidence of immunity, females who are pregnant should delay immunization until after pregnancy. Unvaccinated health care workers born before 1957 who lack laboratory evidence of measles, mumps, or rubella immunity or laboratory confirmation of disease should consider measles and mumps immunization with 2 doses of MMR vaccine or rubella immunization with 1 dose of MMR vaccine.  Pneumococcal 13-valent conjugate (PCV13) vaccine. When indicated, a person who is uncertain of her immunization history and has no record of immunization should receive the PCV13 vaccine. An adult aged 19 years or older who has certain medical conditions and has not been previously immunized should receive 1 dose of PCV13 vaccine. This PCV13 should be followed with a dose of pneumococcal polysaccharide (PPSV23) vaccine. The PPSV23 vaccine dose should be obtained at least 8 weeks after the dose of PCV13 vaccine. An adult aged 19  years or older who has certain medical conditions and previously received 1 or more doses of PPSV23 vaccine should receive 1 dose of PCV13. The PCV13 vaccine dose should be obtained 1 or more years after the last PPSV23 vaccine dose.  Pneumococcal polysaccharide (PPSV23) vaccine. When PCV13 is also indicated, PCV13 should be obtained first. All adults aged 65 years and older should be immunized. An adult younger than age 65 years who has certain medical conditions should be immunized. Any person who resides in a nursing home or long-term care facility should be immunized. An adult smoker should be immunized. People with an immunocompromised condition and certain other conditions should receive both PCV13 and PPSV23 vaccines. People with human immunodeficiency virus (HIV) infection should be immunized as soon as possible after diagnosis. Immunization during chemotherapy or radiation therapy should be avoided. Routine use of PPSV23 vaccine is not recommended for American Indians, [place] Natives, or people younger than 65 years unless there are medical conditions that require PPSV23 vaccine. When indicated, people who have unknown immunization and have no record of immunization should receive PPSV23 vaccine. One-time revaccination 5 years after the first dose of PPSV23 is recommended for people aged 19-64 years who have chronic kidney failure, nephrotic syndrome, asplenia, or immunocompromised conditions. People who received 1-2 doses of PPSV23 before age 65 years should receive another dose of PPSV23 vaccine at age 65 years or later if at least 5 years have passed since the previous dose. Doses of PPSV23 are not needed for people immunized with PPSV23 at or after age 65 years.  Meningococcal vaccine. Adults with asplenia or persistent complement component deficiencies should receive 2 doses of quadrivalent meningococcal conjugate (MenACWY-D) vaccine. The doses should be obtained at least 2 months apart.  Microbiologists working with certain meningococcal bacteria, military recruits, people at risk during an outbreak, and people who travel to or live in countries with a high rate of meningitis should be immunized. A first-year college student up through age   21 years who is living in a residence hall should receive a dose if she did not receive a dose on or after her 16th birthday. Adults who have certain high-risk conditions should receive one or more doses of vaccine.  Hepatitis A vaccine. Adults who wish to be protected from this disease, have certain high-risk conditions, work with hepatitis A-infected animals, work in hepatitis A research labs, or travel to or work in countries with a high rate of hepatitis A should be immunized. Adults who were previously unvaccinated and who anticipate close contact with an international adoptee during the first 60 days after arrival in the [place] from a country with a high rate of hepatitis A should be immunized.  Hepatitis B vaccine. Adults who wish to be protected from this disease, have certain high-risk conditions, may be exposed to blood or other infectious body fluids, are household contacts or sex partners of hepatitis B positive people, are clients or workers in certain care facilities, or travel to or work in countries with a high rate of hepatitis B should be immunized.  Haemophilus influenzae type b (Hib) vaccine. A previously unvaccinated person with asplenia or sickle cell disease or having a scheduled splenectomy should receive 1 dose of Hib vaccine. Regardless of previous immunization, a recipient of a hematopoietic stem cell transplant should receive a 3-dose series 6-12 months after her successful transplant. Hib vaccine is not recommended for adults with HIV infection. Preventive Services / Frequency Ages 64 to 68 years  Blood pressure check.** / Every 1 to 2 years.  Lipid and cholesterol check.** / Every 5 years beginning at age  22.  Clinical breast exam.** / Every 3 years for women in their 88s and 53s.  BRCA-related cancer risk assessment.** / For women who have family members with a BRCA-related cancer (breast, ovarian, tubal, or peritoneal cancers).  Pap test.** / Every 2 years from ages 90 through 51. Every 3 years starting at age 21 through age 56 or 3 with a history of 3 consecutive normal Pap tests.  HPV screening.** / Every 3 years from ages 24 through ages 1 to 46 with a history of 3 consecutive normal Pap tests.  Hepatitis C blood test.** / For any individual with known risks for hepatitis C.  Skin self-exam. / Monthly.  Influenza vaccine. / Every year.  Tetanus, diphtheria, and acellular pertussis (Tdap, Td) vaccine.** / Consult your health care provider. Pregnant women should receive 1 dose of Tdap vaccine during each pregnancy. 1 dose of Td every 10 years.  Varicella vaccine.** / Consult your health care provider. Pregnant females who do not have evidence of immunity should receive the first dose after pregnancy.  HPV vaccine. / 3 doses over 6 months, if 72 and younger. The vaccine is not recommended for use in pregnant females. However, pregnancy testing is not needed before receiving a dose.  Measles, mumps, rubella (MMR) vaccine.** / You need at least 1 dose of MMR if you were born in 1957 or later. You may also need a 2nd dose. For females of childbearing age, rubella immunity should be determined. If there is no evidence of immunity, females who are not pregnant should be vaccinated. If there is no evidence of immunity, females who are pregnant should delay immunization until after pregnancy.  Pneumococcal 13-valent conjugate (PCV13) vaccine.** / Consult your health care provider.  Pneumococcal polysaccharide (PPSV23) vaccine.** / 1 to 2 doses if you smoke cigarettes or if you have certain conditions.  Meningococcal vaccine.** /  1 dose if you are age 19 to 21 years and a first-year college  student living in a residence hall, or have one of several medical conditions, you need to get vaccinated against meningococcal disease. You may also need additional booster doses.  Hepatitis A vaccine.** / Consult your health care provider.  Hepatitis B vaccine.** / Consult your health care provider.  Haemophilus influenzae type b (Hib) vaccine.** / Consult your health care provider. Ages 40 to 64 years  Blood pressure check.** / Every 1 to 2 years.  Lipid and cholesterol check.** / Every 5 years beginning at age 20 years.  Lung cancer screening. / Every year if you are aged 55-80 years and have a 30-pack-year history of smoking and currently smoke or have quit within the past 15 years. Yearly screening is stopped once you have quit smoking for at least 15 years or develop a health problem that would prevent you from having lung cancer treatment.  Clinical breast exam.** / Every year after age 40 years.  BRCA-related cancer risk assessment.** / For women who have family members with a BRCA-related cancer (breast, ovarian, tubal, or peritoneal cancers).  Mammogram.** / Every year beginning at age 40 years and continuing for as long as you are in good health. Consult with your health care provider.  Pap test.** / Every 3 years starting at age 30 years through age 65 or 70 years with a history of 3 consecutive normal Pap tests.  HPV screening.** / Every 3 years from ages 30 years through ages 65 to 70 years with a history of 3 consecutive normal Pap tests.  Fecal occult blood test (FOBT) of stool. / Every year beginning at age 50 years and continuing until age 75 years. You may not need to do this test if you get a colonoscopy every 10 years.  Flexible sigmoidoscopy or colonoscopy.** / Every 5 years for a flexible sigmoidoscopy or every 10 years for a colonoscopy beginning at age 50 years and continuing until age 75 years.  Hepatitis C blood test.** / For all people born from 1945 through  1965 and any individual with known risks for hepatitis C.  Skin self-exam. / Monthly.  Influenza vaccine. / Every year.  Tetanus, diphtheria, and acellular pertussis (Tdap/Td) vaccine.** / Consult your health care provider. Pregnant women should receive 1 dose of Tdap vaccine during each pregnancy. 1 dose of Td every 10 years.  Varicella vaccine.** / Consult your health care provider. Pregnant females who do not have evidence of immunity should receive the first dose after pregnancy.  Zoster vaccine.** / 1 dose for adults aged 60 years or older.  Measles, mumps, rubella (MMR) vaccine.** / You need at least 1 dose of MMR if you were born in 1957 or later. You may also need a 2nd dose. For females of childbearing age, rubella immunity should be determined. If there is no evidence of immunity, females who are not pregnant should be vaccinated. If there is no evidence of immunity, females who are pregnant should delay immunization until after pregnancy.  Pneumococcal 13-valent conjugate (PCV13) vaccine.** / Consult your health care provider.  Pneumococcal polysaccharide (PPSV23) vaccine.** / 1 to 2 doses if you smoke cigarettes or if you have certain conditions.  Meningococcal vaccine.** / Consult your health care provider.  Hepatitis A vaccine.** / Consult your health care provider.  Hepatitis B vaccine.** / Consult your health care provider.  Haemophilus influenzae type b (Hib) vaccine.** / Consult your health care provider. Ages 65   years and over  Blood pressure check.** / Every 1 to 2 years.  Lipid and cholesterol check.** / Every 5 years beginning at age 31 years.  Lung cancer screening. / Every year if you are aged 61-80 years and have a 30-pack-year history of smoking and currently smoke or have quit within the past 15 years. Yearly screening is stopped once you have quit smoking for at least 15 years or develop a health problem that would prevent you from having lung cancer  treatment.  Clinical breast exam.** / Every year after age 9 years.  BRCA-related cancer risk assessment.** / For women who have family members with a BRCA-related cancer (breast, ovarian, tubal, or peritoneal cancers).  Mammogram.** / Every year beginning at age 87 years and continuing for as long as you are in good health. Consult with your health care provider.  Pap test.** / Every 3 years starting at age 62 years through age 41 or 26 years with 3 consecutive normal Pap tests. Testing can be stopped between 65 and 70 years with 3 consecutive normal Pap tests and no abnormal Pap or HPV tests in the past 10 years.  HPV screening.** / Every 3 years from ages 21 years through ages 77 or 4 years with a history of 3 consecutive normal Pap tests. Testing can be stopped between 65 and 70 years with 3 consecutive normal Pap tests and no abnormal Pap or HPV tests in the past 10 years.  Fecal occult blood test (FOBT) of stool. / Every year beginning at age 64 years and continuing until age 56 years. You may not need to do this test if you get a colonoscopy every 10 years.  Flexible sigmoidoscopy or colonoscopy.** / Every 5 years for a flexible sigmoidoscopy or every 10 years for a colonoscopy beginning at age 52 years and continuing until age 51 years.  Hepatitis C blood test.** / For all people born from [birth year] through 1965 and any individual with known risks for hepatitis C.  Osteoporosis screening.** / A one-time screening for women ages 65 years and over and women at risk for fractures or osteoporosis.  Skin self-exam. / Monthly.  Influenza vaccine. / Every year.  Tetanus, diphtheria, and acellular pertussis (Tdap/Td) vaccine.** / 1 dose of Td every 10 years.  Varicella vaccine.** / Consult your health care provider.  Zoster vaccine.** / 1 dose for adults aged 43 years or older.  Pneumococcal 13-valent conjugate (PCV13) vaccine.** / Consult your health care provider.  Pneumococcal  polysaccharide (PPSV23) vaccine.** / 1 dose for all adults aged 37 years and older.  Meningococcal vaccine.** / Consult your health care provider.  Hepatitis A vaccine.** / Consult your health care provider.  Hepatitis B vaccine.** / Consult your health care provider.  Haemophilus influenzae type b (Hib) vaccine.** / Consult your health care provider. ** Family history and personal history of risk and conditions may change your health care provider's recommendations. Document Released: 06/01/2001 Document Revised: 08/20/2013 Document Reviewed: 08/31/2010 [facility] Patient Information 2015 [place], [place]. This information is not intended to replace advice given to you by your health care provider. Make sure you discuss any questions you have with your health care provider.

## 2015-01-10 NOTE — Progress Notes (Signed)
Subjective:     Cheryl Patterson is a 44 y.o. female and is here for a comprehensive physical exam. The patient reports problems - going through menopause-- seeing gyn.----  May have TAH BSO soon.  Seeing urology for possible bladder surgery.    Social History   Social History  . Marital Status: Married    Spouse Name: N/A  . Number of Children: 2  . Years of Education: N/A   Occupational History  . levelor    Social History Main Topics  . Smoking status: Never Smoker   . Smokeless tobacco: Never Used  . Alcohol Use: No  . Drug Use: No  . Sexual Activity:    Partners: Male    Birth Control/ Protection: None   Other Topics Concern  . Not on file   Social History Narrative   Exercise--walk qd                    Video--  rare   Health Maintenance  Topic Date Due  . TETANUS/TDAP  08/08/2014  . INFLUENZA VACCINE  01/01/2016 (Originally 11/18/2014)  . MAMMOGRAM  01/17/2015  . PAP SMEAR  09/26/2016  . HIV Screening  Completed    The following portions of the patient's history were reviewed and updated as appropriate:  She  has a past medical history of Chronic headaches; Depression; Bipolar depression; Allergy; Anxiety; and Chronic kidney disease. She  does not have any pertinent problems on file. She  has past surgical history that includes Tubal ligation; LEEP (2005); Breast surgery; Other surgical history; Mouth surgery; Lipoma excision; and kidney stones. Her family history includes Brain cancer in her maternal aunt; Breast cancer in her mother and paternal aunt; Cancer in her maternal aunt, maternal uncle, and paternal aunt; Cancer (age of onset: 66) in her mother; Colon cancer in her maternal aunt and mother; Diabetes in her maternal grandfather; Heart disease in her paternal grandmother; Hypertension in her paternal grandmother and another family member; Stomach cancer in her maternal aunt; Stroke in her paternal grandmother; Uterine cancer in her maternal aunt. There is no  history of Esophageal cancer or Rectal cancer. She  reports that she has never smoked. She has never used smokeless tobacco. She reports that she does not drink alcohol or use illicit drugs. She has a current medication list which includes the following prescription(s): albuterol, amphetamine-dextroamphetamine, fluconazole, megestrol, and mometasone-formoterol. Current Outpatient Prescriptions on File Prior to Visit  Medication Sig Dispense Refill  . fluconazole (DIFLUCAN) 150 MG tablet Take 1 tablet (150 mg total) by mouth once. 1 tablet 1  . megestrol (MEGACE) 40 MG tablet Take 1 tablet 3 times a day until bleeding slows down or seizes and then take 1 twice a day 30 tablet 1  . mometasone-formoterol (DULERA) 100-5 MCG/ACT AERO Inhale 2 puffs into the lungs 2 (two) times daily as needed. 1 Inhaler 5   No current facility-administered medications on file prior to visit.   She is allergic to codeine and other..  Review of Systems Review of Systems  Constitutional: Negative for activity change, appetite change and fatigue.  HENT: Negative for hearing loss, congestion, tinnitus and ear discharge.  dentist q102mEyes: Negative for visual disturbance (see optho q1y -- vision corrected to 20/20 with glasses).  Respiratory: Negative for cough, chest tightness and shortness of breath.   Cardiovascular: Negative for chest pain, palpitations and leg swelling.  Gastrointestinal: Negative for abdominal pain, diarrhea, constipation and abdominal distention.  Genitourinary: Negative for  urgency, frequency, decreased urine volume and difficulty urinating.  Musculoskeletal: Negative for back pain, arthralgias and gait problem.  Skin: Negative for color change, pallor and rash.  Neurological: Negative for dizziness, light-headedness, numbness and headaches.  Hematological: Negative for adenopathy. Does not bruise/bleed easily.  Psychiatric/Behavioral: Negative for suicidal ideas, confusion, sleep disturbance,  self-injury, dysphoric mood, decreased concentration and agitation.       Objective:    BP 120/76 mmHg  Pulse 75  Temp(Src) 98.4 F (36.9 C) (Oral)  Ht '5\' 7"'  (1.702 m)  Wt 201 lb 12.8 oz (91.536 kg)  BMI 31.60 kg/m2  SpO2 99%  LMP 12/02/2014 General appearance: alert, cooperative, appears stated age and no distress Head: Normocephalic, without obvious abnormality, atraumatic Eyes: negative findings: lids and lashes normal and pupils equal, round, reactive to light and accomodation Ears: normal TM's and external ear canals both ears Nose: Nares normal. Septum midline. Mucosa normal. No drainage or sinus tenderness. Throat: lips, mucosa, and tongue normal; teeth and gums normal Neck: no adenopathy, no carotid bruit, no JVD, supple, symmetrical, trachea midline and thyroid not enlarged, symmetric, no tenderness/mass/nodules Back: symmetric, no curvature. ROM normal. No CVA tenderness. Lungs: clear to auscultation bilaterally Breasts: gyn Heart: regular rate and rhythm, S1, S2 normal, no murmur, click, rub or gallop Abdomen: soft, non-tender; bowel sounds normal; no masses,  no organomegaly Pelvic: deferred---gyn Extremities: extremities normal, atraumatic, no cyanosis or edema Pulses: 2+ and symmetric Skin: Skin color, texture, turgor normal. No rashes or lesions Lymph nodes: Cervical, supraclavicular, and axillary nodes normal. Neurologic: Alert and oriented X 3, normal strength and tone. Normal symmetric reflexes. Normal coordination and gait Psych- no depression, no anxiety      Assessment:    Healthy female exam.     Plan:    ghm utd Check labs See After Visit Summary for Counseling Recommendations   1. Attention deficit hyperactivity disorder (ADHD), predominantly inattentive type  - amphetamine-dextroamphetamine (ADDERALL XR) 30 MG 24 hr capsule; Take 1 capsule (30 mg total) by mouth every morning.  Dispense: 30 capsule; Refill: 0  2. Asthma, chronic, mild  intermittent, uncomplicated   - albuterol (PROVENTIL HFA;VENTOLIN HFA) 108 (90 BASE) MCG/ACT inhaler; Inhale 2 puffs into the lungs every 6 (six) hours as needed for wheezing or shortness of breath.  Dispense: 1 Inhaler; Refill: 2  3. Preventative health care See AVS - Comp Met (CMET) - Lipid panel - POCT urinalysis dipstick - TSH - CBC with Differential/Platelet Flu shot declined today

## 2015-01-10 NOTE — Progress Notes (Signed)
Pre visit review using our clinic review tool, if applicable. No additional management support is needed unless otherwise documented below in the visit note. 

## 2015-01-12 LAB — URINE CULTURE
Colony Count: NO GROWTH
Organism ID, Bacteria: NO GROWTH

## 2015-01-13 ENCOUNTER — Telehealth: Payer: Self-pay

## 2015-01-13 DIAGNOSIS — E785 Hyperlipidemia, unspecified: Secondary | ICD-10-CM

## 2015-01-13 NOTE — Telephone Encounter (Signed)
-----   Message from Rosalita Chessman, DO sent at 01/11/2015  1:17 PM EDT ----- Cholesterol--- LDL goal < 100,  HDL >50,  TG < 150.  Diet and exercise will increase HDL and decrease LDL and TG.  Fish,  Fish Oil, Flaxseed oil will also help increase the HDL and decrease Triglycerides.   Recheck labs in 3-6 months Lipid, cmp.

## 2015-01-13 NOTE — Telephone Encounter (Signed)
Future labs ordered.  

## 2015-01-14 ENCOUNTER — Telehealth: Payer: Self-pay

## 2015-01-14 NOTE — Telephone Encounter (Signed)
Left message to call me.

## 2015-01-14 NOTE — Telephone Encounter (Signed)
Patient was in on 12/25/14 and you prescribed Megace and told her to start with one tab tid until bleeding slowed.  She is still taking it TID because she said bleeding is unchanged and she continues to cramp.    She said that she wanted to have hysterectomy but you thought she should see urologist. First appt they could see her is end of Oct. She feels like something needs to be done sooner.  What to rec?

## 2015-01-14 NOTE — Telephone Encounter (Signed)
Only other option is to offer a Mirena IUD which will help with the bleeding until she sees urologist. Dennis Bast can have Abigail Butts check for insurance coverage if she likes. She needs a hysterectomy but because of her incontinence I wanted the urologist to do urodynamic testing. She can continue on the Megace 40 mg BID as well.

## 2015-01-15 NOTE — Telephone Encounter (Signed)
Patient informed. She declined Mirena IUD. She said she does not want to go through another exam/procedure. She said she will just deal with it. She said she will call urology group and check on cancellation. I encouraged her to call daily and see if they get a sooner cancellation.

## 2015-01-31 ENCOUNTER — Other Ambulatory Visit: Payer: Self-pay | Admitting: Gynecology

## 2015-02-05 ENCOUNTER — Telehealth: Payer: Self-pay

## 2015-02-05 NOTE — Telephone Encounter (Signed)
Patient called because she is anticipating needed a hysterectomy and just wanted an idea how far out scheduling is running. I told her 03/18/15 was first available at this time. She explained that she is having urological evaluation and has to go back for more testing. She said based on initial exam urologist does not think she will need urology surgery. I told her if she does require urological surgery it could push the date a little farther out as we try to coordinate the physicians. I will hold her time on 11/29 and she will check back with me after her urology visit.

## 2015-02-18 ENCOUNTER — Telehealth: Payer: Self-pay | Admitting: Family Medicine

## 2015-02-18 DIAGNOSIS — F900 Attention-deficit hyperactivity disorder, predominantly inattentive type: Secondary | ICD-10-CM

## 2015-02-18 MED ORDER — AMPHETAMINE-DEXTROAMPHET ER 30 MG PO CP24: 30.0000 mg | ORAL_CAPSULE | ORAL | Status: DC

## 2015-02-18 NOTE — Telephone Encounter (Signed)
Caller name: Lema  Relationship to patient: Self  Can be reached: [phone]  Reason for call: pt is requesting a refill on her adderall Rx. Please call when ready for pick up.

## 2015-02-18 NOTE — Telephone Encounter (Signed)
VM left advising Rx is ready for pick up.      KP 

## 2015-02-18 NOTE — Addendum Note (Signed)
Addended by: Ewing Schlein on: 02/18/2015 02:04 PM   Modules accepted: Orders

## 2015-02-21 ENCOUNTER — Telehealth: Payer: Self-pay

## 2015-02-21 NOTE — Telephone Encounter (Signed)
Patient called today to schedule hysterectomy.  She has seen urologist and had complete workup and he told her yesterday that she does not need any urological surgery.  He will send note to you.  I have held time for her on 11/29 and she wants to schedule for that date. If okay with you, please send me surgery order sheet. Thanks

## 2015-02-24 NOTE — Telephone Encounter (Signed)
I spoke with patient and scheduled her for 03/18/15.  She is scheduled to come and see Dr. Moshe Salisbury for pre op .

## 2015-03-10 ENCOUNTER — Ambulatory Visit (INDEPENDENT_AMBULATORY_CARE_PROVIDER_SITE_OTHER): Payer: BLUE CROSS/BLUE SHIELD | Admitting: Gynecology

## 2015-03-10 ENCOUNTER — Encounter: Payer: Self-pay | Admitting: Gynecology

## 2015-03-10 VITALS — BP 108/60 | Ht 66.5 in | Wt 196.0 lb

## 2015-03-10 DIAGNOSIS — D251 Intramural leiomyoma of uterus: Secondary | ICD-10-CM

## 2015-03-10 DIAGNOSIS — Z01818 Encounter for other preprocedural examination: Secondary | ICD-10-CM | POA: Diagnosis not present

## 2015-03-10 DIAGNOSIS — N921 Excessive and frequent menstruation with irregular cycle: Secondary | ICD-10-CM | POA: Diagnosis not present

## 2015-03-10 MED ORDER — METOCLOPRAMIDE HCL 10 MG PO TABS: 10.0000 mg | ORAL_TABLET | Freq: Three times a day (TID) | ORAL | Status: DC

## 2015-03-10 MED ORDER — PHENAZOPYRIDINE HCL 200 MG PO TABS: ORAL_TABLET | ORAL | Status: DC

## 2015-03-10 MED ORDER — MEGESTROL ACETATE 40 MG PO TABS: ORAL_TABLET | ORAL | Status: DC

## 2015-03-10 MED ORDER — MEPERIDINE HCL 50 MG PO TABS
50.0000 mg | ORAL_TABLET | ORAL | Status: DC | PRN
Start: 2015-03-10 — End: 2016-04-20

## 2015-03-10 NOTE — Progress Notes (Signed)
Shatiqua Nguyen Taliaferro is an 44 y.o. who presented to the office for preoperative consultation and examination prior to her scheduled laparoscopic-assisted vaginal hysterectomy with bilateral salpingectomy as a result of symptomatic leiomyomatous uteri. Patient also been complaining of bloating sensation and at times having to wear a pad because of urinary incontinence. She had been referred to the urologist who did not feel at this time she needed any anti- incontinence surgery but to wait and monitor after her hysterectomy because of the thought that the fibroids were put pressure on her bladder.  Patient with previous tubal sterilization procedure had a sonohysterogram and endometrial biopsy in the office whereby the following was noted:  Uterus measured 10.5 x 7.9 x 6.8 cm with endometrial stripe 11.2 mm. Patient with several intramural fibroids the largest one measuring 44 x 36 mm. Right ovary had a thinwall echo-free follicle measured 14 x 18 mm the second 19 x 26 x 14 mm. small corpus luteum cyst was noted measuring 19 x 9 mm. Left ovary was normal. No fluid in the cul-de-sac. Thickened endometrium. The sonohysterogram did not demonstrate any intracavitary defect. Her uterus sounded to 8 cm. An endometrial biopsy was done at that office visit which demonstrated the following:  Her pathology report demonstrated the following: Diagnosis Endometrium, biopsy, uterus - DEGENERATING SECRETORY-TYPE ENDOMETRIUM, SEE COMMENT Microscopic Comment This pattern can be associated with menses, irregular shedding or breakthrough bleeding associated with hormonal therapy. Clinical correlation is suggested.  Her CBC this year demonstrated hemoglobin 13.2 Patient has been on Megace 40 mg 3 times a day to help control her bleeding until at time of her surgery.  Pertinent Gynecological History: Menses: Dysfunction uterine bleeding Bleeding: dysfunctional uterine bleeding Contraception: tubal ligation DES  exposure: unknown Blood transfusions: none Sexually transmitted diseases: Gonorrhea and chlamydia past history and treated Previous GYN Procedures: 2 vaginal deliveries, one D&C, 1 LEEP cervical conization for dysplasia  Last mammogram: normal Date: 2015 Last pap: normal Date: 2015 OB History: G 3, P 2 A1   Menstrual History: Menarche age: 11 Patient's last menstrual period was 11/27/2014 (approximate).    Past Medical History  Diagnosis Date  . Chronic headaches   . Depression     [facility]---suicidal ideation  . Bipolar depression ([place])   . Allergy   . Anxiety   . Chronic kidney disease     kidney stones    Past Surgical History  Procedure Laterality Date  . Tubal ligation    . Leep  2005  . Breast surgery      BREAST LUMP REMOVAL ??WHICH BREAST  . Other surgical history      RENAL STONE REMOVAL  . Mouth surgery    . Lipoma excision    . Kidney stones      Family History  Problem Relation Age of Onset  . Hypertension    . Stroke Paternal Grandmother   . Hypertension Paternal Grandmother   . Heart disease Paternal Grandmother   . Breast cancer Paternal Aunt   . Cancer Paternal Aunt     breast  . Stomach cancer Maternal Aunt   . Colon cancer Maternal Aunt   . Cancer Maternal Aunt     colon  . Uterine cancer Maternal Aunt   . Brain cancer Maternal Aunt   . Diabetes Maternal Grandfather   . Breast cancer Mother   . Colon cancer Mother   . Cancer Mother 76    colon  . Cancer Maternal Uncle  COLON  . Esophageal cancer Neg Hx   . Rectal cancer Neg Hx     Social History:  reports that she has never smoked. She has never used smokeless tobacco. She reports that she does not drink alcohol or use illicit drugs.  Allergies:  Allergies  Allergen Reactions  . Codeine     Itchy throat, whelps on skin  . Other Swelling and Rash    tomatoes     (Not in a hospital admission)  REVIEW OF SYSTEMS: A ROS was performed and pertinent positives  and negatives are included in the history.  GENERAL: No fevers or chills. HEENT: No change in vision, no earache, sore throat or sinus congestion. NECK: No pain or stiffness. CARDIOVASCULAR: No chest pain or pressure. No palpitations. PULMONARY: No shortness of breath, cough or wheeze. GASTROINTESTINAL: No abdominal pain, nausea, vomiting or diarrhea, melena or bright red blood per rectum. GENITOURINARY: No urinary frequency, urgency, hesitancy or dysuria. MUSCULOSKELETAL: No joint or muscle pain, no back pain, no recent trauma. DERMATOLOGIC: No rash, no itching, no lesions. ENDOCRINE: No polyuria, polydipsia, no heat or cold intolerance. No recent change in weight. HEMATOLOGICAL: No anemia or easy bruising or bleeding. NEUROLOGIC: No headache, seizures, numbness, tingling or weakness. PSYCHIATRIC: No depression, no loss of interest in normal activity or change in sleep pattern.     Blood pressure 108/60, height 5' 6.5" (1.689 m), weight 196 lb (88.905 kg), last menstrual period 11/27/2014.  Physical Exam:  HEENT:unremarkable Neck:Supple, midline, no thyroid megaly, no carotid bruits Lungs:  Clear to auscultation no rhonchi's or wheezes Heart:Regular rate and rhythm, no murmurs or gallops Breast Exam: Symmetrical in appearance no palpable masses or tenderness no supraclavicular axillary lymphadenopathy Abdomen: Soft nontender no rebound or guarding Pelvic:BUS within normal limits Vagina: Some blood noted in the vaginal vault but no lesions Cervix: No lesions seen no active bleeding Uterus: Uterus 8 week size irregular shaped, anterior lower uterine segment fibroid measuring about 3 cm was palpated on bimanual exam her bladder Adnexa: No palpable masses or tenderness Extremities: No cords, no edema Rectal: Not done    Assessment/Plan: 44 year old patient with dysfunctional uterine bleeding as well as complaining of pelvic pressure and urinary incontinence as a result of her leiomyomatous  uteri and right lower quadrant discomfort. Patient be scheduled for laparoscopic-assisted vaginal hysterectomy with bilateral salpingectomy. She will continue on iron supplementation Megace until at time of her surgery. She was provided with a prescription for Pyridium to take 200 mg tablet 1 tablet the night before surgery and wants that the morning of the surgery in an effort to assess ureteral patency at time of cystoscopy after her hysterectomy. She was also provided with prescription for Percocet and Reglan for postop pain and analgesia. The risks discussed with her surgery were as follows:                        Patient was counseled as to the risk of surgery to include the following:  1. Infection (prohylactic antibiotics will be administered)  2. DVT/Pulmonary Embolism (prophylactic pneumo compression stockings will be used)  3.Trauma to internal organs requiring additional surgical procedure to repair any injury to     Internal organs requiring perhaps additional hospitalization days.  4.Hemmorhage requiring transfusion and blood products which carry risks such as             anaphylactic reaction, hepatitis and AIDS  Patient had received literature information on the procedure scheduled and  all her questions were answered and fully accepts all risk.   FERNANDEZ,JUAN HMD12:41 PM

## 2015-03-10 NOTE — Patient Instructions (Signed)
Laparoscopically Assisted Vaginal Hysterectomy A laparoscopically assisted vaginal hysterectomy (LAVH) is a surgical procedure to remove the uterus and cervix, and sometimes the ovaries and fallopian tubes. During an LAVH, some of the surgical removal is done through the vagina, and the rest is done through a few small surgical cuts (incisions) in the abdomen.  This procedure is usually considered in women when a vaginal hysterectomy is not an option. Your health care provider will discuss the risks and benefits of the different surgical techniques at your appointment. Generally, recovery time is faster and there are fewer complications after laparoscopic procedures than after open incisional procedures. LET YOUR HEALTH CARE PROVIDER KNOW ABOUT:   Any allergies you have.  All medicines you are taking, including vitamins, herbs, eye drops, creams, and over-the-counter medicines.  Previous problems you or members of your family have had with the use of anesthetics.  Any blood disorders you have.  Previous surgeries you have had.  Medical conditions you have. RISKS AND COMPLICATIONS Generally, this is a safe procedure. However, as with any procedure, complications can occur. Possible complications include:  Allergies to medicines.  Difficulty breathing.  Bleeding.  Infection.  Damage to other structures near your uterus and cervix. BEFORE THE PROCEDURE  Ask your health care provider about changing or stopping your regular medicines.  Take certain medicines, such as a colon-emptying preparation, as directed.  Do not eat or drink anything for at least 8 hours before your surgery.  Stop smoking if you smoke. Stopping will improve your health after surgery.  Arrange for a ride home after surgery and for help at home during recovery. PROCEDURE   An IV tube will be put into one of your veins in order to give you fluids and medicines.  You will receive medicines to relax you and  medicines that make you sleep (general anesthetic).  You may have a flexible tube (catheter) put into your bladder to drain urine.  You may have a tube put through your nose or mouth that goes into your stomach (nasogastric tube). The nasogastric tube removes digestive fluids and prevents you from feeling nauseated and from vomiting.  Tight-fitting (compression) stockings will be placed on your legs to promote circulation.  Three to four small incisions will be made in your abdomen. An incision also will be made in your vagina. Probes and tools will be inserted into the small incisions. The uterus and cervix are removed (and possibly your ovaries and fallopian tubes) through your vagina as well as through the small incisions that were made in the abdomen.  Your vagina is then sewn back to normal. AFTER THE PROCEDURE  You may have a liquid diet temporarily. You will most likely return to, and tolerate, your usual diet the day after surgery.  You will be passing urine through a catheter. It will be removed the day after surgery.  Your temperature, breathing rate, heart rate, blood pressure, and oxygen level will be monitored regularly.  You will still wear compression stockings on your legs until you are able to move around.  You will use a special device or do breathing exercises to keep your lungs clear.  You will be encouraged to walk as soon as possible.   This information is not intended to replace advice given to you by your health care provider. Make sure you discuss any questions you have with your health care provider.   Document Released: 03/25/2011 Document Revised: 04/26/2014 Document Reviewed: 10/19/2012 Elsevier Interactive Patient Education 2016 Else[employer]   Inc[employer]  

## 2015-03-11 ENCOUNTER — Encounter (HOSPITAL_COMMUNITY): Payer: Self-pay

## 2015-03-11 ENCOUNTER — Encounter (HOSPITAL_COMMUNITY)
Admission: RE | Admit: 2015-03-11 | Discharge: 2015-03-11 | Disposition: A | Discharge status: Home / Self Care | Payer: BLUE CROSS/BLUE SHIELD | Source: Ambulatory Visit | Attending: Gynecology | Admitting: Gynecology

## 2015-03-11 DIAGNOSIS — D259 Leiomyoma of uterus, unspecified: Secondary | ICD-10-CM | POA: Insufficient documentation

## 2015-03-11 DIAGNOSIS — Z01812 Encounter for preprocedural laboratory examination: Secondary | ICD-10-CM | POA: Insufficient documentation

## 2015-03-11 HISTORY — DX: Renal tubulo-interstitial disease, unspecified: N15.9

## 2015-03-11 HISTORY — DX: Unspecified asthma, uncomplicated: J45.909

## 2015-03-11 HISTORY — DX: Gastro-esophageal reflux disease without esophagitis: K21.9

## 2015-03-11 HISTORY — DX: Sepsis, unspecified organism: A41.9

## 2015-03-11 HISTORY — DX: Urinary tract infection, site not specified: N39.0

## 2015-03-11 LAB — CBC
HCT: 38 % (ref 36.0–46.0)
Hemoglobin: 12.7 g/dL (ref 12.0–15.0)
MCH: 28.3 pg (ref 26.0–34.0)
MCHC: 33.4 g/dL (ref 30.0–36.0)
MCV: 84.6 fL (ref 78.0–100.0)
Platelets: 292 10*3/uL (ref 150–400)
RBC: 4.49 MIL/uL (ref 3.87–5.11)
RDW: 12.7 % (ref 11.5–15.5)
WBC: 5 10*3/uL (ref 4.0–10.5)

## 2015-03-11 NOTE — Patient Instructions (Signed)
Your procedure is scheduled on:  Tuesday, Nov. 29, 2016  Enter through the [employer] [facility] at:  6:00 AM  Pick up the phone at the desk and dial [phone].  Call this number if you have problems the morning of surgery: [phone].  Remember: Do NOT eat food or drink after:  Midnight Monday Take these medicines the morning of surgery with a SIP OF WATER: Adderall, Pyridium  *Bring in asthma inhaler day of surgery  Do NOT wear jewelry (body piercing), metal hair clips/bobby pins, make-up, or nail polish. Do NOT wear lotions, powders, or perfumes.  You may wear deoderant. Do NOT shave for 48 hours prior to surgery. Do NOT bring valuables to the hospital. Contacts, dentures, or bridgework may not be worn into surgery. Leave suitcase in car.  After surgery it may be brought to your room.  For patients admitted to the hospital, checkout time is 11:00 AM the day of discharge. Have a responsible adult drive you home and stay with you for 24 hours after your procedure

## 2015-03-18 ENCOUNTER — Ambulatory Visit (HOSPITAL_COMMUNITY): Payer: BLUE CROSS/BLUE SHIELD | Admitting: Anesthesiology

## 2015-03-18 ENCOUNTER — Observation Stay (HOSPITAL_COMMUNITY)
Admission: RE | Admit: 2015-03-18 | Discharge: 2015-03-19 | Disposition: A | Discharge status: Home / Self Care | Payer: BLUE CROSS/BLUE SHIELD | Source: Ambulatory Visit | Attending: Gynecology | Admitting: Gynecology

## 2015-03-18 ENCOUNTER — Encounter (HOSPITAL_COMMUNITY)
Admission: RE | Disposition: A | Discharge status: Home / Self Care | Payer: Self-pay | Source: Ambulatory Visit | Attending: Gynecology

## 2015-03-18 ENCOUNTER — Encounter (HOSPITAL_COMMUNITY): Payer: Self-pay | Admitting: *Deleted

## 2015-03-18 DIAGNOSIS — N938 Other specified abnormal uterine and vaginal bleeding: Secondary | ICD-10-CM | POA: Insufficient documentation

## 2015-03-18 DIAGNOSIS — R32 Unspecified urinary incontinence: Secondary | ICD-10-CM | POA: Insufficient documentation

## 2015-03-18 DIAGNOSIS — D251 Intramural leiomyoma of uterus: Principal | ICD-10-CM | POA: Insufficient documentation

## 2015-03-18 DIAGNOSIS — D252 Subserosal leiomyoma of uterus: Secondary | ICD-10-CM | POA: Diagnosis not present

## 2015-03-18 DIAGNOSIS — Z9889 Other specified postprocedural states: Secondary | ICD-10-CM

## 2015-03-18 HISTORY — PX: LAPAROSCOPIC BILATERAL SALPINGECTOMY: SHX5889

## 2015-03-18 HISTORY — PX: LAPAROSCOPIC VAGINAL HYSTERECTOMY WITH SALPINGO OOPHORECTOMY: SHX6681

## 2015-03-18 HISTORY — PX: CYSTOSCOPY: SHX5120

## 2015-03-18 LAB — PREGNANCY, URINE: Preg Test, Ur: NEGATIVE

## 2015-03-18 SURGERY — HYSTERECTOMY, VAGINAL, LAPAROSCOPY-ASSISTED, WITH SALPINGO-OOPHORECTOMY
Anesthesia: General | Site: Urethra

## 2015-03-18 MED ORDER — FENTANYL CITRATE (PF) 100 MCG/2ML IJ SOLN
25.0000 ug | INTRAMUSCULAR | Status: DC | PRN
  Administered 2015-03-18 (×2): 25 ug via INTRAVENOUS

## 2015-03-18 MED ORDER — DIPHENHYDRAMINE HCL 50 MG/ML IJ SOLN: 12.5000 mg | Freq: Four times a day (QID) | INTRAMUSCULAR | Status: DC | PRN

## 2015-03-18 MED ORDER — GLYCOPYRROLATE 0.2 MG/ML IJ SOLN
INTRAMUSCULAR | Status: AC
  Filled 2015-03-18: qty 3

## 2015-03-18 MED ORDER — HYDROMORPHONE HCL 1 MG/ML IJ SOLN
INTRAMUSCULAR | Status: DC | PRN
  Administered 2015-03-18 (×2): 0.5 mg via INTRAVENOUS

## 2015-03-18 MED ORDER — AMPHETAMINE-DEXTROAMPHET ER 30 MG PO CP24: 30.0000 mg | ORAL_CAPSULE | ORAL | Status: DC

## 2015-03-18 MED ORDER — DEXAMETHASONE SODIUM PHOSPHATE 4 MG/ML IJ SOLN
INTRAMUSCULAR | Status: AC
Start: 2015-03-18 — End: 2015-03-18
  Filled 2015-03-18: qty 1

## 2015-03-18 MED ORDER — LIDOCAINE-EPINEPHRINE 1 %-1:100000 IJ SOLN
INTRAMUSCULAR | Status: AC
  Filled 2015-03-18: qty 1

## 2015-03-18 MED ORDER — ACETAMINOPHEN 10 MG/ML IV SOLN
1000.0000 mg | Freq: Once | INTRAVENOUS | Status: AC
  Administered 2015-03-18: 1000 mg via INTRAVENOUS
  Filled 2015-03-18: qty 100

## 2015-03-18 MED ORDER — SODIUM CHLORIDE 0.9 % IJ SOLN
INTRAMUSCULAR | Status: AC
  Filled 2015-03-18: qty 3

## 2015-03-18 MED ORDER — GLYCOPYRROLATE 0.2 MG/ML IJ SOLN
INTRAMUSCULAR | Status: DC | PRN
  Administered 2015-03-18: 0.6 mg via INTRAVENOUS

## 2015-03-18 MED ORDER — LIDOCAINE HCL (CARDIAC) 20 MG/ML IV SOLN
INTRAVENOUS | Status: DC | PRN
  Administered 2015-03-18: 100 mg via INTRAVENOUS

## 2015-03-18 MED ORDER — FENTANYL CITRATE (PF) 100 MCG/2ML IJ SOLN
INTRAMUSCULAR | Status: AC
  Filled 2015-03-18: qty 2

## 2015-03-18 MED ORDER — METHYLENE BLUE 1 % INJ SOLN
INTRAMUSCULAR | Status: AC
  Filled 2015-03-18: qty 1

## 2015-03-18 MED ORDER — NEOSTIGMINE METHYLSULFATE 10 MG/10ML IV SOLN
INTRAVENOUS | Status: DC | PRN
  Administered 2015-03-18: 4 mg via INTRAVENOUS

## 2015-03-18 MED ORDER — MEPERIDINE HCL 50 MG PO TABS
50.0000 mg | ORAL_TABLET | ORAL | Status: DC | PRN
  Administered 2015-03-19: 50 mg via ORAL
  Filled 2015-03-18 (×2): qty 1

## 2015-03-18 MED ORDER — FENTANYL CITRATE (PF) 100 MCG/2ML IJ SOLN
INTRAMUSCULAR | Status: DC | PRN
  Administered 2015-03-18: 50 ug via INTRAVENOUS
  Administered 2015-03-18: 100 ug via INTRAVENOUS
  Administered 2015-03-18: 50 ug via INTRAVENOUS
  Administered 2015-03-18: 100 ug via INTRAVENOUS
  Administered 2015-03-18 (×3): 50 ug via INTRAVENOUS

## 2015-03-18 MED ORDER — STERILE WATER FOR IRRIGATION IR SOLN
Status: DC | PRN
  Administered 2015-03-18: 1000 mL

## 2015-03-18 MED ORDER — FENTANYL CITRATE (PF) 250 MCG/5ML IJ SOLN
INTRAMUSCULAR | Status: AC
  Filled 2015-03-18: qty 5

## 2015-03-18 MED ORDER — SCOPOLAMINE 1 MG/3DAYS TD PT72
MEDICATED_PATCH | TRANSDERMAL | Status: AC
  Filled 2015-03-18: qty 1

## 2015-03-18 MED ORDER — ROCURONIUM BROMIDE 100 MG/10ML IV SOLN
INTRAVENOUS | Status: AC
  Filled 2015-03-18: qty 1

## 2015-03-18 MED ORDER — HYDROMORPHONE HCL 1 MG/ML IJ SOLN
INTRAMUSCULAR | Status: AC
  Filled 2015-03-18: qty 1

## 2015-03-18 MED ORDER — NEOSTIGMINE METHYLSULFATE 10 MG/10ML IV SOLN
INTRAVENOUS | Status: AC
  Filled 2015-03-18: qty 1

## 2015-03-18 MED ORDER — BUPIVACAINE HCL (PF) 0.25 % IJ SOLN
INTRAMUSCULAR | Status: AC
  Filled 2015-03-18: qty 30

## 2015-03-18 MED ORDER — PROPOFOL 10 MG/ML IV BOLUS
INTRAVENOUS | Status: DC | PRN
  Administered 2015-03-18: 200 mg via INTRAVENOUS

## 2015-03-18 MED ORDER — ALBUTEROL SULFATE (2.5 MG/3ML) 0.083% IN NEBU: 3.0000 mL | INHALATION_SOLUTION | Freq: Four times a day (QID) | RESPIRATORY_TRACT | Status: DC | PRN

## 2015-03-18 MED ORDER — MEPERIDINE HCL 25 MG/ML IJ SOLN: 6.2500 mg | INTRAMUSCULAR | Status: DC | PRN

## 2015-03-18 MED ORDER — ROCURONIUM BROMIDE 100 MG/10ML IV SOLN
INTRAVENOUS | Status: DC | PRN
  Administered 2015-03-18: 40 mg via INTRAVENOUS
  Administered 2015-03-18: 10 mg via INTRAVENOUS

## 2015-03-18 MED ORDER — LACTATED RINGERS IV SOLN: INTRAVENOUS | Status: DC

## 2015-03-18 MED ORDER — DEXAMETHASONE SODIUM PHOSPHATE 10 MG/ML IJ SOLN
INTRAMUSCULAR | Status: DC | PRN
  Administered 2015-03-18: 4 mg via INTRAVENOUS

## 2015-03-18 MED ORDER — ONDANSETRON HCL 4 MG/2ML IJ SOLN
INTRAMUSCULAR | Status: DC | PRN
  Administered 2015-03-18: 4 mg via INTRAVENOUS

## 2015-03-18 MED ORDER — BUPIVACAINE HCL (PF) 0.25 % IJ SOLN
INTRAMUSCULAR | Status: DC | PRN
  Administered 2015-03-18: 10 mL

## 2015-03-18 MED ORDER — MIDAZOLAM HCL 2 MG/2ML IJ SOLN
INTRAMUSCULAR | Status: AC
  Filled 2015-03-18: qty 2

## 2015-03-18 MED ORDER — DEXTROSE 5 % IV SOLN
2.0000 g | Freq: Two times a day (BID) | INTRAVENOUS | Status: DC
  Administered 2015-03-18: 2 g via INTRAVENOUS
  Filled 2015-03-18 (×4): qty 2

## 2015-03-18 MED ORDER — MIDAZOLAM HCL 2 MG/2ML IJ SOLN
INTRAMUSCULAR | Status: DC | PRN
  Administered 2015-03-18: 2 mg via INTRAVENOUS

## 2015-03-18 MED ORDER — HEPARIN SODIUM (PORCINE) 5000 UNIT/ML IJ SOLN
INTRAMUSCULAR | Status: AC
  Filled 2015-03-18: qty 1

## 2015-03-18 MED ORDER — KETOROLAC TROMETHAMINE 30 MG/ML IJ SOLN
30.0000 mg | Freq: Once | INTRAMUSCULAR | Status: AC | PRN
  Administered 2015-03-18: 30 mg via INTRAVENOUS

## 2015-03-18 MED ORDER — LIDOCAINE-EPINEPHRINE 1 %-1:100000 IJ SOLN
INTRAMUSCULAR | Status: DC | PRN
  Administered 2015-03-18: 20 mL

## 2015-03-18 MED ORDER — PROPOFOL 10 MG/ML IV BOLUS
INTRAVENOUS | Status: AC
  Filled 2015-03-18: qty 20

## 2015-03-18 MED ORDER — LACTATED RINGERS IV SOLN
INTRAVENOUS | Status: DC
  Administered 2015-03-18 (×3): via INTRAVENOUS

## 2015-03-18 MED ORDER — ONDANSETRON HCL 4 MG/2ML IJ SOLN
INTRAMUSCULAR | Status: AC
  Filled 2015-03-18: qty 2

## 2015-03-18 MED ORDER — DIPHENHYDRAMINE HCL 12.5 MG/5ML PO ELIX: 12.5000 mg | ORAL_SOLUTION | Freq: Four times a day (QID) | ORAL | Status: DC | PRN

## 2015-03-18 MED ORDER — NALOXONE HCL 0.4 MG/ML IJ SOLN: 0.4000 mg | INTRAMUSCULAR | Status: DC | PRN

## 2015-03-18 MED ORDER — SCOPOLAMINE 1 MG/3DAYS TD PT72
1.0000 | MEDICATED_PATCH | Freq: Once | TRANSDERMAL | Status: DC
Start: 2015-03-18 — End: 2015-03-18
  Administered 2015-03-18: 1.5 mg via TRANSDERMAL

## 2015-03-18 MED ORDER — LACTATED RINGERS IR SOLN
Status: DC | PRN
  Administered 2015-03-18: 3000 mL

## 2015-03-18 MED ORDER — FENTANYL 40 MCG/ML IV SOLN
INTRAVENOUS | Status: DC
  Administered 2015-03-18: 14:00:00 via INTRAVENOUS
  Administered 2015-03-18: 150 ug via INTRAVENOUS
  Administered 2015-03-18: 140 ug via INTRAVENOUS
  Administered 2015-03-19: 50 ug via INTRAVENOUS
  Administered 2015-03-19: 40 ug via INTRAVENOUS
  Filled 2015-03-18: qty 25

## 2015-03-18 MED ORDER — PROMETHAZINE HCL 25 MG/ML IJ SOLN: 6.2500 mg | INTRAMUSCULAR | Status: DC | PRN

## 2015-03-18 MED ORDER — SODIUM CHLORIDE 0.9 % IJ SOLN: 9.0000 mL | INTRAMUSCULAR | Status: DC | PRN

## 2015-03-18 MED ORDER — LIDOCAINE HCL (CARDIAC) 20 MG/ML IV SOLN
INTRAVENOUS | Status: AC
  Filled 2015-03-18: qty 5

## 2015-03-18 MED ORDER — ONDANSETRON HCL 4 MG/2ML IJ SOLN: 4.0000 mg | Freq: Four times a day (QID) | INTRAMUSCULAR | Status: DC | PRN

## 2015-03-18 MED ORDER — KETOROLAC TROMETHAMINE 30 MG/ML IJ SOLN
INTRAMUSCULAR | Status: AC
  Filled 2015-03-18: qty 1

## 2015-03-18 SURGICAL SUPPLY — 61 items
BAG SPEC RTRVL LRG 6X4 10 (ENDOMECHANICALS)
BARRIER ADHS 3X4 INTERCEED (GAUZE/BANDAGES/DRESSINGS) IMPLANT
BRR ADH 4X3 ABS CNTRL BYND (GAUZE/BANDAGES/DRESSINGS)
CABLE HIGH FREQUENCY MONO STRZ (ELECTRODE) IMPLANT
CLOTH BEACON ORANGE TIMEOUT ST (SAFETY) ×4 IMPLANT
CONT PATH 16OZ SNAP LID 3702 (MISCELLANEOUS) ×4 IMPLANT
COVER BACK TABLE 60X90IN (DRAPES) ×4 IMPLANT
COVER MAYO STAND STRL (DRAPES) ×4 IMPLANT
DECANTER SPIKE VIAL GLASS SM (MISCELLANEOUS) ×2 IMPLANT
DRSG COVADERM PLUS 2X2 (GAUZE/BANDAGES/DRESSINGS) ×8 IMPLANT
DRSG OPSITE POSTOP 3X4 (GAUZE/BANDAGES/DRESSINGS) ×1 IMPLANT
DURAPREP 26ML APPLICATOR (WOUND CARE) ×4 IMPLANT
ELECT REM PT RETURN 9FT ADLT (ELECTROSURGICAL) ×4
ELECTRODE REM PT RTRN 9FT ADLT (ELECTROSURGICAL) IMPLANT
EVACUATOR SMOKE 8.L (FILTER) ×4 IMPLANT
FILTER SMOKE EVAC LAPAROSHD (FILTER) ×4 IMPLANT
GLOVE BIOGEL PI IND STRL 7.0 (GLOVE) ×9 IMPLANT
GLOVE BIOGEL PI IND STRL 8 (GLOVE) ×3 IMPLANT
GLOVE BIOGEL PI INDICATOR 7.0 (GLOVE) ×6
GLOVE BIOGEL PI INDICATOR 8 (GLOVE) ×1
GLOVE ECLIPSE 7.5 STRL STRAW (GLOVE) ×8 IMPLANT
GOWN STRL REUS W/TWL LRG LVL3 (GOWN DISPOSABLE) ×8 IMPLANT
HEMOSTAT SURGICEL 2X14 (HEMOSTASIS) ×4 IMPLANT
LEGGING LITHOTOMY PAIR STRL (DRAPES) ×4 IMPLANT
LIQUID BAND (GAUZE/BANDAGES/DRESSINGS) IMPLANT
NS IRRIG 1000ML POUR BTL (IV SOLUTION) ×4 IMPLANT
PACK LAPAROSCOPY BASIN (CUSTOM PROCEDURE TRAY) ×4 IMPLANT
PACK LAVH (CUSTOM PROCEDURE TRAY) ×4 IMPLANT
PACK ROBOTIC GOWN (GOWN DISPOSABLE) ×4 IMPLANT
PAD POSITIONING PINK XL (MISCELLANEOUS) ×4 IMPLANT
POUCH SPECIMEN RETRIEVAL 10MM (ENDOMECHANICALS) IMPLANT
SET CYSTO W/LG BORE CLAMP LF (SET/KITS/TRAYS/PACK) IMPLANT
SET IRRIG TUBING LAPAROSCOPIC (IRRIGATION / IRRIGATOR) ×4 IMPLANT
SHEARS HARMONIC ACE PLUS 36CM (ENDOMECHANICALS) ×4 IMPLANT
SLEEVE XCEL OPT CAN 5 100 (ENDOMECHANICALS) ×4 IMPLANT
SOLUTION ELECTROLUBE (MISCELLANEOUS) IMPLANT
STRIP CLOSURE SKIN 1/4X3 (GAUZE/BANDAGES/DRESSINGS) IMPLANT
SUT VIC AB 0 CT1 18XCR BRD8 (SUTURE) ×6 IMPLANT
SUT VIC AB 0 CT1 27 (SUTURE)
SUT VIC AB 0 CT1 27XBRD ANBCTR (SUTURE) IMPLANT
SUT VIC AB 0 CT1 36 (SUTURE) ×5 IMPLANT
SUT VIC AB 0 CT1 8-18 (SUTURE) ×8
SUT VIC AB 2-0 SH 27 (SUTURE)
SUT VIC AB 2-0 SH 27XBRD (SUTURE) IMPLANT
SUT VIC AB 3-0 CT1 27 (SUTURE)
SUT VIC AB 3-0 CT1 TAPERPNT 27 (SUTURE) IMPLANT
SUT VIC AB 3-0 PS2 18 (SUTURE) ×4
SUT VIC AB 3-0 PS2 18XBRD (SUTURE) ×3 IMPLANT
SUT VIC AB 3-0 SH 27 (SUTURE)
SUT VIC AB 3-0 SH 27X BRD (SUTURE) IMPLANT
SUT VICRYL 0 TIES 12 18 (SUTURE) ×4 IMPLANT
SUT VICRYL 0 UR6 27IN ABS (SUTURE) ×4 IMPLANT
SUT VICRYL RAPIDE 3 0 (SUTURE) ×6 IMPLANT
SYR BULB IRRIGATION 50ML (SYRINGE) ×4 IMPLANT
TOWEL OR 17X24 6PK STRL BLUE (TOWEL DISPOSABLE) ×8 IMPLANT
TRAY FOLEY CATH SILVER 14FR (SET/KITS/TRAYS/PACK) ×4 IMPLANT
TROCAR BALLN 12MMX100 BLUNT (TROCAR) IMPLANT
TROCAR XCEL NON-BLD 11X100MML (ENDOMECHANICALS) ×4 IMPLANT
TROCAR XCEL NON-BLD 5MMX100MML (ENDOMECHANICALS) ×4 IMPLANT
WARMER LAPAROSCOPE (MISCELLANEOUS) ×4 IMPLANT
WATER STERILE IRR 1000ML POUR (IV SOLUTION) ×3 IMPLANT

## 2015-03-18 NOTE — Anesthesia Postprocedure Evaluation (Signed)
Anesthesia Post Note  Patient: Mekenzi Mleczko Anderle  Procedure(s) Performed: Procedure(s) (LRB): LAPAROSCOPIC ASSISTED VAGINAL HYSTERECTOMY WITH SALPINGO OOPHORECTOMY (N/A) CYSTOSCOPY (N/A) LAPAROSCOPIC BILATERAL SALPINGECTOMY (Bilateral)  Patient location during evaluation: PACU Anesthesia Type: General Level of consciousness: sedated Pain management: pain level controlled Vital Signs Assessment: post-procedure vital signs reviewed and stable Respiratory status: spontaneous breathing Cardiovascular status: stable Postop Assessment: no signs of nausea or vomiting Anesthetic complications: no    Last Vitals:  Filed Vitals:   03/18/15 1100 03/18/15 1115  BP: 123/71 124/73  Pulse: 90 90  Temp:  36.7 C  Resp: 23 22    Last Pain:  Filed Vitals:   03/18/15 1126  PainSc: [place]

## 2015-03-18 NOTE — Transfer of Care (Signed)
Immediate Anesthesia Transfer of Care Note  Patient: Cheryl Patterson  Procedure(s) Performed: Procedure(s): LAPAROSCOPIC ASSISTED VAGINAL HYSTERECTOMY WITH SALPINGO OOPHORECTOMY (N/A) CYSTOSCOPY (N/A) LAPAROSCOPIC BILATERAL SALPINGECTOMY (Bilateral)  Patient Location: PACU  Anesthesia Type:General  Level of Consciousness: alert  and patient cooperative  Airway & Oxygen Therapy: Patient Spontanous Breathing and Patient connected to nasal cannula oxygen  Post-op Assessment: Report given to RN and Post -op Vital signs reviewed and stable  Post vital signs: Reviewed and stable  Last Vitals:  Filed Vitals:   03/18/15 0609  BP: 125/82  Pulse: 91  Temp: [id number] C    Complications: No apparent anesthesia complications

## 2015-03-18 NOTE — H&P (View-Only) (Signed)
Cheryl Patterson is an 44 y.o. who presented to the office for preoperative consultation and examination prior to her scheduled laparoscopic-assisted vaginal hysterectomy with bilateral salpingectomy as a result of symptomatic leiomyomatous uteri. Patient also been complaining of bloating sensation and at times having to wear a pad because of urinary incontinence. She had been referred to the urologist who did not feel at this time she needed any anti- incontinence surgery but to wait and monitor after her hysterectomy because of the thought that the fibroids were put pressure on her bladder.  Patient with previous tubal sterilization procedure had a sonohysterogram and endometrial biopsy in the office whereby the following was noted:  Uterus measured 10.5 x 7.9 x 6.8 cm with endometrial stripe 11.2 mm. Patient with several intramural fibroids the largest one measuring 44 x 36 mm. Right ovary had a thinwall echo-free follicle measured 14 x 18 mm the second 19 x 26 x 14 mm. small corpus luteum cyst was noted measuring 19 x 9 mm. Left ovary was normal. No fluid in the cul-de-sac. Thickened endometrium. The sonohysterogram did not demonstrate any intracavitary defect. Her uterus sounded to 8 cm. An endometrial biopsy was done at that office visit which demonstrated the following:  Her pathology report demonstrated the following: Diagnosis Endometrium, biopsy, uterus - DEGENERATING SECRETORY-TYPE ENDOMETRIUM, SEE COMMENT Microscopic Comment This pattern can be associated with menses, irregular shedding or breakthrough bleeding associated with hormonal therapy. Clinical correlation is suggested.  Her CBC this year demonstrated hemoglobin 13.2 Patient has been on Megace 40 mg 3 times a day to help control her bleeding until at time of her surgery.  Pertinent Gynecological History: Menses: Dysfunction uterine bleeding Bleeding: dysfunctional uterine bleeding Contraception: tubal ligation DES  exposure: unknown Blood transfusions: none Sexually transmitted diseases: Gonorrhea and chlamydia past history and treated Previous GYN Procedures: 2 vaginal deliveries, one D&C, 1 LEEP cervical conization for dysplasia  Last mammogram: normal Date: 2015 Last pap: normal Date: 2015 OB History: G 3, P 2 A1   Menstrual History: Menarche age: 79 Patient's last menstrual period was 11/27/2014 (approximate).    Past Medical History  Diagnosis Date  . Chronic headaches   . Depression     [facility]---suicidal ideation  . Bipolar depression ([place])   . Allergy   . Anxiety   . Chronic kidney disease     kidney stones    Past Surgical History  Procedure Laterality Date  . Tubal ligation    . Leep  2005  . Breast surgery      BREAST LUMP REMOVAL ??WHICH BREAST  . Other surgical history      RENAL STONE REMOVAL  . Mouth surgery    . Lipoma excision    . Kidney stones      Family History  Problem Relation Age of Onset  . Hypertension    . Stroke Paternal Grandmother   . Hypertension Paternal Grandmother   . Heart disease Paternal Grandmother   . Breast cancer Paternal Aunt   . Cancer Paternal Aunt     breast  . Stomach cancer Maternal Aunt   . Colon cancer Maternal Aunt   . Cancer Maternal Aunt     colon  . Uterine cancer Maternal Aunt   . Brain cancer Maternal Aunt   . Diabetes Maternal Grandfather   . Breast cancer Mother   . Colon cancer Mother   . Cancer Mother 88    colon  . Cancer Maternal Uncle  COLON  . Esophageal cancer Neg Hx   . Rectal cancer Neg Hx     Social History:  reports that she has never smoked. She has never used smokeless tobacco. She reports that she does not drink alcohol or use illicit drugs.  Allergies:  Allergies  Allergen Reactions  . Codeine     Itchy throat, whelps on skin  . Other Swelling and Rash    tomatoes     (Not in a hospital admission)  REVIEW OF SYSTEMS: A ROS was performed and pertinent positives  and negatives are included in the history.  GENERAL: No fevers or chills. HEENT: No change in vision, no earache, sore throat or sinus congestion. NECK: No pain or stiffness. CARDIOVASCULAR: No chest pain or pressure. No palpitations. PULMONARY: No shortness of breath, cough or wheeze. GASTROINTESTINAL: No abdominal pain, nausea, vomiting or diarrhea, melena or bright red blood per rectum. GENITOURINARY: No urinary frequency, urgency, hesitancy or dysuria. MUSCULOSKELETAL: No joint or muscle pain, no back pain, no recent trauma. DERMATOLOGIC: No rash, no itching, no lesions. ENDOCRINE: No polyuria, polydipsia, no heat or cold intolerance. No recent change in weight. HEMATOLOGICAL: No anemia or easy bruising or bleeding. NEUROLOGIC: No headache, seizures, numbness, tingling or weakness. PSYCHIATRIC: No depression, no loss of interest in normal activity or change in sleep pattern.     Blood pressure 108/60, height 5' 6.5" (1.689 m), weight 196 lb (88.905 kg), last menstrual period 11/27/2014.  Physical Exam:  HEENT:unremarkable Neck:Supple, midline, no thyroid megaly, no carotid bruits Lungs:  Clear to auscultation no rhonchi's or wheezes Heart:Regular rate and rhythm, no murmurs or gallops Breast Exam: Symmetrical in appearance no palpable masses or tenderness no supraclavicular axillary lymphadenopathy Abdomen: Soft nontender no rebound or guarding Pelvic:BUS within normal limits Vagina: Some blood noted in the vaginal vault but no lesions Cervix: No lesions seen no active bleeding Uterus: Uterus 8 week size irregular shaped, anterior lower uterine segment fibroid measuring about 3 cm was palpated on bimanual exam her bladder Adnexa: No palpable masses or tenderness Extremities: No cords, no edema Rectal: Not done    Assessment/Plan: 44 year old patient with dysfunctional uterine bleeding as well as complaining of pelvic pressure and urinary incontinence as a result of her leiomyomatous  uteri and right lower quadrant discomfort. Patient be scheduled for laparoscopic-assisted vaginal hysterectomy with bilateral salpingectomy. She will continue on iron supplementation Megace until at time of her surgery. She was provided with a prescription for Pyridium to take 200 mg tablet 1 tablet the night before surgery and wants that the morning of the surgery in an effort to assess ureteral patency at time of cystoscopy after her hysterectomy. She was also provided with prescription for Percocet and Reglan for postop pain and analgesia. The risks discussed with her surgery were as follows:                        Patient was counseled as to the risk of surgery to include the following:  1. Infection (prohylactic antibiotics will be administered)  2. DVT/Pulmonary Embolism (prophylactic pneumo compression stockings will be used)  3.Trauma to internal organs requiring additional surgical procedure to repair any injury to     Internal organs requiring perhaps additional hospitalization days.  4.Hemmorhage requiring transfusion and blood products which carry risks such as             anaphylactic reaction, hepatitis and AIDS  Patient had received literature information on the procedure scheduled and  all her questions were answered and fully accepts all risk.   FERNANDEZ,JUAN HMD12:41 PM

## 2015-03-18 NOTE — Anesthesia Preprocedure Evaluation (Signed)
Anesthesia Evaluation  Patient identified by MRN, date of birth, ID band  Reviewed: Allergy & Precautions, H&P , NPO status , Patient's Chart, lab work & pertinent test results, reviewed documented beta blocker date and time   Airway Mallampati: III  TM Distance: >3 FB Neck ROM: full    Dental  (+) Teeth Intact   Pulmonary    Pulmonary exam normal        Cardiovascular negative cardio ROS Normal cardiovascular exam     Neuro/Psych    GI/Hepatic Neg liver ROS,   Endo/Other  negative endocrine ROS  Renal/GU negative Renal ROS     Musculoskeletal   Abdominal Normal abdominal exam  (+)   Peds  Hematology negative hematology ROS (+)   Anesthesia Other Findings   Reproductive/Obstetrics negative OB ROS                             Anesthesia Physical Anesthesia Plan  ASA: II  Anesthesia Plan: General   Post-op Pain Management:    Induction: Intravenous  Airway Management Planned: Oral ETT  Additional Equipment:   Intra-op Plan:   Post-operative Plan: Extubation in OR  Informed Consent: I have reviewed the patients History and Physical, chart, labs and discussed the procedure including the risks, benefits and alternatives for the proposed anesthesia with the patient or authorized representative who has indicated his/her understanding and acceptance.   Dental Advisory Given  Plan Discussed with: CRNA and Surgeon  Anesthesia Plan Comments:         Anesthesia Quick Evaluation

## 2015-03-18 NOTE — Interval H&P Note (Signed)
History and Physical Interval Note:  03/18/2015 7:10 AM  Cheryl Patterson  has presented today for surgery, with the diagnosis of symptomatic fibroids  The various methods of treatment have been discussed with the patient and family. After consideration of risks, benefits and other options for treatment, the patient has consented to  Procedure(s): LAPAROSCOPIC ASSISTED VAGINAL HYSTERECTOMY WITH SALPINGO OOPHORECTOMY (N/A) CYSTOSCOPY (N/A) LAPAROSCOPIC BILATERAL SALPINGECTOMY (Bilateral) as a surgical intervention .  The patient's history has been reviewed, patient examined, no change in status, stable for surgery.  I have reviewed the patient's chart and labs.  Questions were answered to the patient's satisfaction.     Terrance Mass

## 2015-03-18 NOTE — Anesthesia Postprocedure Evaluation (Signed)
Anesthesia Post Note  Patient: Tyhesia Adger Finklea  Procedure(s) Performed: Procedure(s) (LRB): LAPAROSCOPIC ASSISTED VAGINAL HYSTERECTOMY WITH SALPINGO OOPHORECTOMY (N/A) CYSTOSCOPY (N/A) LAPAROSCOPIC BILATERAL SALPINGECTOMY (Bilateral)  Patient location during evaluation: [facility] Anesthesia Type: General Level of consciousness: awake, awake and alert, oriented and patient cooperative Pain management: pain level controlled Vital Signs Assessment: post-procedure vital signs reviewed and stable Respiratory status: spontaneous breathing, nonlabored ventilation, respiratory function stable and patient connected to nasal cannula oxygen Cardiovascular status: stable Postop Assessment: no signs of nausea or vomiting Anesthetic complications: no    Last Vitals:  Filed Vitals:   03/18/15 1331 03/18/15 1423  BP:  136/75  Pulse:  100  Temp:  36.8 C  Resp: 21 15    Last Pain:  Filed Vitals:   03/18/15 1447  PainSc: 9                  Elfa Wooton L

## 2015-03-18 NOTE — Addendum Note (Signed)
Addendum  created 03/18/15 1517 by Raenette Rover, CRNA   Modules edited: Clinical Notes   Clinical Notes:  File: [id number]

## 2015-03-18 NOTE — Op Note (Addendum)
Operative Note  03/18/2015  2:03 PM  PATIENT:  Cheryl Patterson  44 y.o. female  PRE-OPERATIVE DIAGNOSIS:  symptomatic fibroids  POST-OPERATIVE DIAGNOSIS:  symptomatic fibroids  PROCEDURE:  Procedure(s): LAPAROSCOPIC ASSISTED VAGINAL HYSTERECTOMY WITH BILATERAL SALPINGECTOMY /lysis of abdominal pelvic adhesions/CYSTOSCOPY   SURGEON:  Surgeon(s): Terrance Mass, MD Anastasio Auerbach, MD  ANESTHESIA:   general  FINDINGS: Multiple subserosal intramural and left broad ligament fibroids were noted. Otherwise normal appearing tubes and ovaries. Omentum adhesion to the anterior abdominal wall were noted.   DESCRIPTION OF OPERATION:After adequate general endotracheal anesthesia, the patient was placed in dorsal lithotomy position, prepped and draped in the usual manner for a laparoscopic procedure. Patient received her prophylactic antibiotic and had PAS stockings in place as well.  A time out was undertaken for proper identification of the patient and procedure to be performed. A speculum was placed into the vagina. A bimanual exam was performed and then then  A single tooth tenaculum was utilized to grasp the anterior lip of the uterine cervix. The uterus was sounded to  9cm The single-tooth tenaculum and speculum were removed from the vagina. A foley catheter was inserted to monitor urinary output.  At this time, an infraumbilical  skin incision was made through which a 10/11-mm  Opti-View trocar was introduced through the infraumbilical incision into the abdominal cavity. A pneumoperitoneum was established  With CO2 for approximately 2 1/2 liters. Through the trocar sheath, the laparoscope was inserted and adequate visualization of the pelvic structures was noted. Two  5-mm skin incision were made on the patients right and left lower abdomin under laparoscopic guidance and  two  5-mm trocars were  introduced into the abdominal cavity for instrumentation. Evaluation of the pelvis revealed  the following:   Multilobulated fibroid uterus with subserosal, intramural myomas. Evidence of previous tubal ligation was noted. Parasitic left pelvic sidewall fibroid. Normal-appearing ovaries. Abdominal pelvic adhesions.   The ureters were noted to be deep in the pelvis. The left fallopian tube and ovary were identified. With the use of the Harmonic Scalpel the nasal salpinx was coaptated and transected to the level of the uterotubal junction. With the Harmonic Scalpel the left utero-ovarian ligament was coaptated and transected leaving the ovary behind. At this time, The anterior leaf of the broad ligament was then dissected to the midline bilaterally, establishing a bladder flap with a combination of blunt and sharp dissection. Similar procedure was carried out on the contralateral side leaving the right ovary. At this time, attention was made to the vaginal hysterectomy. The laparoscope was removed and attention was made to the vaginal hysterectomy.  Hulka Tenaculum  was removed and the anterior and posterior leafs of the cervix were grasped with Lahey tenaculum. A circumferential injection with  1% Lidocaine with 1:100,000  Epinephrine dilution  Was injected into  the cervicovaginal portio for 10 cc's. . A circumferential incision was then made at the cervicovaginal portio. The anterior and posterior colpotomies were accomplished with a combination of blunt and sharp dissection without difficulty. The right uterosacral ligament was clamped, transected, and ligated with #0 Vicryl sutures. The left uterosacral ligament was clamped, transected, and ligated with #0 Vicryl suture. The parametrial tissue was then clamped bilaterally, transected, and ligated with #0 Vicryl suture bilaterally. The uterus was then removed after morcellating it as a result of the multiple fibroids  and passed off the operative field along with both fallopian tubes. Laparotomy pack was placed into the pelvis. The pedicles were  evaluated. There was no bleeding noted; therefore, the laparotomy pack was removed. The uterosacral ligaments were suture fixated into the vaginal cuff angles with #0 Vicryl sutures. The vaginal cuff was then closed Hemostasis was noted throughout. The posterior peritoneum was secured to the vagina with a running stitch of 0-Vicryl from 3-9 o'clock. The vaginal cuff was then closed with interrupted figure of eights with 0-Vicryl  At this point cystoscopy was performed with a 70 cystoscope and using normal saline to distend the bladder. Patient previously received Pyridium 200 mg by mouth the day before surgery and this morning in an effort to appreciate ureteral patency during cystoscopy. Both ureteral orifices were identified at the bladder trigone and was noted to be functioning properly and the rest of the lateral mucosa was intact.  .At this time, the laparoscope was reinserted into the abdomen. The abdomen was reinsufflated. Evaluation revealed no further bleeding. Irrigation with sterile water was performed and again no bleeding was noted. 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 The  5 mm trocar sheaths  were then removed under laparoscopic visualization. The laparoscope was removed. The carbon dioxide was allowed to escape from the abdomen and the infraumbilical trocar sheath was then removed. The skin incisions were closed with 0-vicryl at the subumbilical incision site and the skin as well as the 5 mm ports were reapproximated with Dermabon Glue. 0.25% Marcaine was infiltrated all 3 incision ports for total 10 cc for postoperative analgesia. Neosporin and small dressings applied. There were no complications. The instrument, sponge, and needle counts were correct. PAtient was extubated and transferred to  recovery room.     ESTIMATED BLOOD LOSS: 100 cc   Intake/Output Summary (Last 24 hours) at 03/18/15 1403 Last data filed at 03/18/15 0900  Gross per 24 hour  Intake   1400 ml  Output    400 ml  Net   1000 ml     BLOOD ADMINISTERED:none   LOCAL MEDICATIONS USED:  MARCAINE 0.25% injected at 3 incision ports for postop analgesia for a total of 10 cc. Also the cervical stroma 1% lidocaine with 1 100,000 was infiltrated at the 2, 4, 8, and 10:00 position.    SPECIMEN:  Source of Specimen:  Uterus, cervix, bilateral fallopian tubes  DISPOSITION OF SPECIMEN:  PATHOLOGY  COUNTS:  YES  PLAN OF CARE: Transfer to PACU  [facility] HMD2:03 PMTD@  l.

## 2015-03-18 NOTE — Anesthesia Procedure Notes (Signed)
Procedure Name: Intubation Date/Time: 03/18/2015 7:29 AM Performed by: Alaster Asfaw, Sheron Nightingale Pre-anesthesia Checklist: Patient identified, Timeout performed, Emergency Drugs available, Suction available and Patient being monitored Patient Re-evaluated:Patient Re-evaluated prior to inductionOxygen Delivery Method: Circle system utilized Preoxygenation: Pre-oxygenation with 100% oxygen Intubation Type: IV induction Ventilation: Mask ventilation without difficulty Laryngoscope Size: Mac and 3 Grade View: Grade II Tube type: Oral Number of attempts: 2 Placement Confirmation: ETT inserted through vocal cords under direct vision,  positive ETCO2 and breath sounds checked- equal and bilateral Secured at: 22 cm Dental Injury: Teeth and Oropharynx as per pre-operative assessment

## 2015-03-19 ENCOUNTER — Encounter (HOSPITAL_COMMUNITY): Payer: Self-pay | Admitting: Gynecology

## 2015-03-19 DIAGNOSIS — D251 Intramural leiomyoma of uterus: Secondary | ICD-10-CM | POA: Diagnosis not present

## 2015-03-19 LAB — CBC
HEMATOCRIT: 32.5 % — AB (ref 36.0–46.0)
HEMOGLOBIN: 11.2 g/dL — AB (ref 12.0–15.0)
MCH: 28.7 pg (ref 26.0–34.0)
MCHC: 34.5 g/dL (ref 30.0–36.0)
MCV: 83.3 fL (ref 78.0–100.0)
Platelets: 259 10*3/uL (ref 150–400)
RBC: 3.9 MIL/uL (ref 3.87–5.11)
RDW: 13.1 % (ref 11.5–15.5)
WBC: 9.3 10*3/uL (ref 4.0–10.5)

## 2015-03-19 NOTE — Discharge Summary (Signed)
Physician Discharge Summary  Patient ID: Cheryl Patterson MRN: [id number] DOB/AGE: [birth year] 44 y.o.  Admit date: 03/18/2015 Discharge date: 03/19/2015  Admission Diagnoses:  Discharge Diagnoses:  Active Problems:   Postoperative state   Discharged Condition: good  Hospital Course: Patient was admitted to the hospital November 29 when she underwent laparoscopic-assisted vaginal hysterectomy with bilateral salpingectomy secondary to symptomatic leiomyomatous uteri. Patient has done well. She had a PCA pump for less than 12 hours and was discontinued at 0500 hrs. along with her Foley catheter. Patient with urinary output. Patient's tolerated liquid diet and before discharge she'll have a regular diet. She has been up and ambulating. She is still not voided since the time of her Foley catheter since it was removed less than 3 hours ago. Patient has remained normotensive and afebrile and ready to be discharged home at noon today.  Consults: None  Significant Diagnostic Studies: labs: Preoperative hemoglobin 12.7 postop hemoglobin 11.2  Treatments: surgery: Laparoscopic-assisted vaginal hysterectomy with bilateral salpingectomy and cystoscopy.  Discharge Exam: Blood pressure 108/58, pulse 70, temperature 99 F (37.2 C), temperature source Oral, resp. rate 18, height 5\' 6"  (1.676 m), weight 195 lb (88.451 kg), SpO2 100 %. General appearance: alert and cooperative Cardio: regular rate and rhythm, S1, S2 normal, no murmur, click, rub or gallop GI: soft, non-tender; bowel sounds normal; no masses,  no organomegaly Extremities: extremities normal, atraumatic, no cyanosis or edema Incision/Wound: incision ports intact  Disposition: 01-Home or Self Care     Medication List    ASK your doctor about these medications        albuterol 108 (90 BASE) MCG/ACT inhaler  Commonly known as:  PROVENTIL HFA;VENTOLIN HFA  Inhale 2 puffs into the lungs every 6 (six) hours as needed for wheezing or  shortness of breath.     amphetamine-dextroamphetamine 30 MG 24 hr capsule  Commonly known as:  ADDERALL XR  Take 1 capsule (30 mg total) by mouth every morning.     calcium carbonate 750 MG chewable tablet  Commonly known as:  TUMS EX  Chew 1 tablet by mouth daily.     diclofenac 75 MG EC tablet  Commonly known as:  VOLTAREN  Take 75 mg by mouth daily as needed (For back pain.).     diphenhydrAMINE 25 MG tablet  Commonly known as:  BENADRYL  Take 25 mg by mouth daily as needed for allergies or sleep.     ibuprofen 200 MG tablet  Commonly known as:  ADVIL,MOTRIN  Take 800 mg by mouth every 6 (six) hours as needed for moderate pain.     IRON PO  Take 1 tablet by mouth daily.     megestrol 40 MG tablet  Commonly known as:  MEGACE  One PO TID until surgery #21     meperidine 50 MG tablet  Commonly known as:  DEMEROL  Take 1 tablet (50 mg total) by mouth every 4 (four) hours as needed for severe pain.     metoCLOPramide 10 MG tablet  Commonly known as:  REGLAN  Take 1 tablet (10 mg total) by mouth 3 (three) times daily with meals.     phenazopyridine 200 MG tablet  Commonly known as:  PYRIDIUM  Take one the nightr before surgery and one the morning of surgery         Signed: Terrance Mass 03/19/2015, 7:45 AM

## 2015-03-19 NOTE — Progress Notes (Signed)
Fentanyl PCA d/c. Waste 19 mL in med room sink.  Witnessed by Glorianne Manchester, RN.

## 2015-03-19 NOTE — Progress Notes (Signed)

## 2015-03-28 DIAGNOSIS — Z0289 Encounter for other administrative examinations: Secondary | ICD-10-CM

## 2015-04-03 ENCOUNTER — Encounter: Payer: Self-pay | Admitting: Gynecology

## 2015-04-03 ENCOUNTER — Ambulatory Visit (INDEPENDENT_AMBULATORY_CARE_PROVIDER_SITE_OTHER): Payer: BLUE CROSS/BLUE SHIELD | Admitting: Gynecology

## 2015-04-03 VITALS — BP 128/84

## 2015-04-03 DIAGNOSIS — D62 Acute posthemorrhagic anemia: Secondary | ICD-10-CM

## 2015-04-03 DIAGNOSIS — Z09 Encounter for follow-up examination after completed treatment for conditions other than malignant neoplasm: Secondary | ICD-10-CM

## 2015-04-03 LAB — CBC WITH DIFFERENTIAL/PLATELET
BASOS PCT: 0 % (ref 0–1)
Basophils Absolute: 0 10*3/uL (ref 0.0–0.1)
EOS ABS: 0.6 10*3/uL (ref 0.0–0.7)
EOS PCT: 8 % — AB (ref 0–5)
HCT: 37.5 % (ref 36.0–46.0)
HEMOGLOBIN: 12.4 g/dL (ref 12.0–15.0)
Lymphocytes Relative: 34 % (ref 12–46)
Lymphs Abs: 2.8 10*3/uL (ref 0.7–4.0)
MCH: 28 pg (ref 26.0–34.0)
MCHC: 33.1 g/dL (ref 30.0–36.0)
MCV: 84.7 fL (ref 78.0–100.0)
MONO ABS: 0.4 10*3/uL (ref 0.1–1.0)
MONOS PCT: 5 % (ref 3–12)
MPV: 8.6 fL (ref 8.6–12.4)
NEUTROS ABS: 4.3 10*3/uL (ref 1.7–7.7)
Neutrophils Relative %: 53 % (ref 43–77)
PLATELETS: 370 10*3/uL (ref 150–400)
RBC: 4.43 MIL/uL (ref 3.87–5.11)
RDW: 13.1 % (ref 11.5–15.5)
WBC: 8.1 10*3/uL (ref 4.0–10.5)

## 2015-04-03 MED ORDER — FLUCONAZOLE 150 MG PO TABS: 150.0000 mg | ORAL_TABLET | Freq: Once | ORAL | Status: DC

## 2015-04-03 NOTE — Progress Notes (Signed)
   Patient is a 44 year old who presented to the office for her two-week postop visit. On 03/18/2015 patient underwent a laparoscopic-assisted vaginal hysterectomy with bilateral salpingectomy as a result of symptomatic leiomyomatous uteri. Findings from surgery as well as pathology report and pictures show with the patient.  Patient was found to have intraoperatively several intramural and subserosal myoma as well as left broad ligament leiomyoma.  Pathology report reported the following: Diagnosis Uterus and bilateral fallopian tubes, cervix LEIOMYOMAS ENDOMETRIUM WITH PROGESTIN EFFECT CERVIX: NO PATHOLOGICAL ALTERATIONS BILATERAL FALLOPIAN TUBES: NO PATHOLOGICAL ALTERATIONS  Exam: Abdomen soft nontender no rebound or guarding Pelvic: Bartholin urethra Skene was within normal limits Vagina: No lesions or discharge Vaginal cuff intact Bimanual exam no palpable masses or tenderness Rectal exam not done  Assessment/plan patient 2 weeks status post laparoscopic-assisted vaginal hysterectomy bilateral salpingectomy for symptomatically leiomyomatous uteri patient on well patient was some mild external vulvar pruritus all: A prescription Diflucan 150 mg 1 by mouth today. Patient will return back to the office in 4 weeks for final postop visit.

## 2015-05-01 ENCOUNTER — Telehealth: Payer: Self-pay | Admitting: Family Medicine

## 2015-05-01 DIAGNOSIS — F900 Attention-deficit hyperactivity disorder, predominantly inattentive type: Secondary | ICD-10-CM

## 2015-05-01 MED ORDER — AMPHETAMINE-DEXTROAMPHET ER 30 MG PO CP24: 30.0000 mg | ORAL_CAPSULE | ORAL | Status: DC

## 2015-05-01 NOTE — Telephone Encounter (Signed)
VM left advising Rx faxed.   KP 

## 2015-05-01 NOTE — Telephone Encounter (Signed)
Caller name: Self   Can be reached: [phone]   Reason for call: Request refill on amphetamine-dextroamphetamine (ADDERALL XR) 30 MG 24 hr capsule [id number]  May leave VM when rx ready

## 2015-05-02 ENCOUNTER — Encounter: Payer: Self-pay | Admitting: Gynecology

## 2015-05-02 ENCOUNTER — Ambulatory Visit (INDEPENDENT_AMBULATORY_CARE_PROVIDER_SITE_OTHER): Payer: BLUE CROSS/BLUE SHIELD | Admitting: Gynecology

## 2015-05-02 VITALS — BP 128/76

## 2015-05-02 DIAGNOSIS — Z09 Encounter for follow-up examination after completed treatment for conditions other than malignant neoplasm: Secondary | ICD-10-CM

## 2015-05-02 NOTE — Progress Notes (Signed)
   Patient is a 45 year old who presented to the office for her six-week postop visit. Patient is doing well she is asymptomatic. On 03/18/2015 patient underwent a laparoscopic-assisted vaginal hysterectomy with bilateral salpingectomy as a result of symptomatic leiomyomatous uteri. Findings from surgery as well as pathology report and pictures show with the patient.  Patient was found to have intraoperatively several intramural and subserosal myoma as well as left broad ligament leiomyoma.  Pathology report reported the following: Diagnosis Uterus and bilateral fallopian tubes, cervix LEIOMYOMAS ENDOMETRIUM WITH PROGESTIN EFFECT CERVIX: NO PATHOLOGICAL ALTERATIONS BILATERAL FALLOPIAN TUBES: NO PATHOLOGICAL ALTERATIONS  Exam: Abdomen soft nontender no rebound or guarding Pelvic: Bartholin urethra Skene was within normal limits Vagina: No lesions or discharge Vaginal cuff intact Bimanual exam no palpable masses or tenderness Rectal exam not done  Assessment/plan: Patient 6 weeks status post laparoscopic-assisted vaginal hysterectomy bilateral salpingectomy for symptomatic leiomyomatous uteri doing well. Patient may resume full normal activity. Patient return in one year for annual exam or when necessary. Patient was provided with a requisition to schedule her overdue mammogram. Patient's hemoglobin and hematocrit 04/03/2015 were 12.4 37.5 respectively.

## 2015-05-22 ENCOUNTER — Other Ambulatory Visit: Payer: Self-pay | Admitting: Family Medicine

## 2015-05-22 DIAGNOSIS — N649 Disorder of breast, unspecified: Secondary | ICD-10-CM

## 2015-06-04 ENCOUNTER — Telehealth: Payer: Self-pay | Admitting: *Deleted

## 2015-06-04 NOTE — Telephone Encounter (Signed)
Received Physician Order; forwarded to provider/SLS 02/15

## 2015-06-06 ENCOUNTER — Other Ambulatory Visit: Payer: BLUE CROSS/BLUE SHIELD

## 2015-06-12 ENCOUNTER — Ambulatory Visit
Admission: RE | Admit: 2015-06-12 | Discharge: 2015-06-12 | Disposition: A | Discharge status: Home / Self Care | Payer: BLUE CROSS/BLUE SHIELD | Source: Ambulatory Visit | Attending: Family Medicine | Admitting: Family Medicine

## 2015-06-12 DIAGNOSIS — N649 Disorder of breast, unspecified: Secondary | ICD-10-CM

## 2015-06-17 ENCOUNTER — Telehealth: Payer: Self-pay | Admitting: Family Medicine

## 2015-06-17 DIAGNOSIS — F900 Attention-deficit hyperactivity disorder, predominantly inattentive type: Secondary | ICD-10-CM

## 2015-06-17 MED ORDER — AMPHETAMINE-DEXTROAMPHET ER 30 MG PO CP24: 30.0000 mg | ORAL_CAPSULE | ORAL | Status: DC

## 2015-06-17 NOTE — Telephone Encounter (Signed)
Caller name: Self  Can be reached: [phone]   Reason for call: Refill on amphetamine-dextroamphetamine (ADDERALL XR) 30 MG 24 hr capsule [id number]

## 2015-06-17 NOTE — Telephone Encounter (Signed)
Patient has been made aware Rx ready for pick up.    KP

## 2015-06-23 ENCOUNTER — Other Ambulatory Visit: Payer: Self-pay | Admitting: Women's Health

## 2015-07-10 ENCOUNTER — Ambulatory Visit: Payer: BLUE CROSS/BLUE SHIELD | Admitting: Family Medicine

## 2015-07-15 ENCOUNTER — Encounter: Payer: Self-pay | Admitting: Family Medicine

## 2015-08-05 ENCOUNTER — Telehealth: Payer: Self-pay | Admitting: Family Medicine

## 2015-08-05 DIAGNOSIS — F900 Attention-deficit hyperactivity disorder, predominantly inattentive type: Secondary | ICD-10-CM

## 2015-08-05 MED ORDER — AMPHETAMINE-DEXTROAMPHET ER 30 MG PO CP24: 30.0000 mg | ORAL_CAPSULE | ORAL | Status: DC

## 2015-08-05 NOTE — Telephone Encounter (Signed)
Relation to [id number] Call back [phone]: Pharmacy:  Reason for call:  Patient requesting a refill amphetamine-dextroamphetamine (ADDERALL XR) 30 MG 24 hr capsule

## 2015-08-05 NOTE — Telephone Encounter (Signed)
VM left advising Rx is ready for pick up.      KP 

## 2015-09-16 ENCOUNTER — Ambulatory Visit: Payer: BLUE CROSS/BLUE SHIELD | Admitting: Family Medicine

## 2015-09-16 ENCOUNTER — Telehealth: Payer: Self-pay | Admitting: Family Medicine

## 2015-09-18 NOTE — Telephone Encounter (Signed)
Pt was no show 09/16/15 for acute appt, 1st no show + 1 cancellation w/in 12 months, pt has not rescheduled, charge or no charge?

## 2015-09-18 NOTE — Telephone Encounter (Signed)
No charge. 

## 2015-10-02 ENCOUNTER — Other Ambulatory Visit: Payer: Self-pay | Admitting: Family Medicine

## 2015-10-02 DIAGNOSIS — F900 Attention-deficit hyperactivity disorder, predominantly inattentive type: Secondary | ICD-10-CM

## 2015-10-02 MED ORDER — AMPHETAMINE-DEXTROAMPHET ER 30 MG PO CP24: 30.0000 mg | ORAL_CAPSULE | ORAL | Status: DC

## 2015-10-02 NOTE — Telephone Encounter (Signed)
Patient aware Rx ready for pick up.      KP 

## 2015-10-02 NOTE — Telephone Encounter (Signed)
Pt is requesting a refill for Adderall. Pt says that her insurance will only cover the Brand name. ALSO, pt says that she would like to discuss trying the weight loss med Belvique.   Please call back to advise.   CB: [phone]

## 2015-10-02 NOTE — Telephone Encounter (Signed)
Last adderall RX: 07/20/15, #30 UDS: 06/19/15 low risk, next due 12/2015 Next OV: was due 06/2015 but pt cancelled. Last OV: 01/10/15  Pt will need office visit to discuss. Scheduled pt for follow up on 10/30/15. Pt states she is out of Adderall now. Rx printed and forwarded to PCP for signature.

## 2015-10-30 ENCOUNTER — Ambulatory Visit: Payer: BLUE CROSS/BLUE SHIELD | Admitting: Family Medicine

## 2015-10-30 DIAGNOSIS — Z0289 Encounter for other administrative examinations: Secondary | ICD-10-CM

## 2015-10-31 ENCOUNTER — Encounter: Payer: Self-pay | Admitting: Family Medicine

## 2015-12-11 ENCOUNTER — Ambulatory Visit (HOSPITAL_BASED_OUTPATIENT_CLINIC_OR_DEPARTMENT_OTHER)
Admission: RE | Admit: 2015-12-11 | Discharge: 2015-12-11 | Disposition: A | Discharge status: Home / Self Care | Payer: BLUE CROSS/BLUE SHIELD | Source: Ambulatory Visit | Attending: Family Medicine | Admitting: Family Medicine

## 2015-12-11 ENCOUNTER — Encounter: Payer: Self-pay | Admitting: Family Medicine

## 2015-12-11 ENCOUNTER — Ambulatory Visit (INDEPENDENT_AMBULATORY_CARE_PROVIDER_SITE_OTHER): Payer: BLUE CROSS/BLUE SHIELD | Admitting: Family Medicine

## 2015-12-11 VITALS — BP 118/68 | HR 84 | Temp 98.9°F | Wt 199.4 lb

## 2015-12-11 DIAGNOSIS — R059 Cough, unspecified: Secondary | ICD-10-CM

## 2015-12-11 DIAGNOSIS — R05 Cough: Secondary | ICD-10-CM | POA: Insufficient documentation

## 2015-12-11 DIAGNOSIS — J208 Acute bronchitis due to other specified organisms: Secondary | ICD-10-CM

## 2015-12-11 DIAGNOSIS — F900 Attention-deficit hyperactivity disorder, predominantly inattentive type: Secondary | ICD-10-CM | POA: Diagnosis not present

## 2015-12-11 MED ORDER — PROMETHAZINE-DM 6.25-15 MG/5ML PO SYRP: 5.0000 mL | ORAL_SOLUTION | Freq: Four times a day (QID) | ORAL | 0 refills | Status: DC | PRN

## 2015-12-11 MED ORDER — AZITHROMYCIN 250 MG PO TABS: ORAL_TABLET | ORAL | 0 refills | Status: DC

## 2015-12-11 MED ORDER — AMPHETAMINE-DEXTROAMPHET ER 30 MG PO CP24: 30.0000 mg | ORAL_CAPSULE | ORAL | 0 refills | Status: DC

## 2015-12-11 MED FILL — ADDERALL XR 30 MG CAP SA: 30 | 30 days supply | Qty: 30 | Fill #0

## 2015-12-11 NOTE — Progress Notes (Signed)
Pre visit review using our clinic review tool, if applicable. No additional management support is needed unless otherwise documented below in the visit note. 

## 2015-12-11 NOTE — Patient Instructions (Signed)

## 2015-12-11 NOTE — Progress Notes (Signed)
Patient ID: Cheryl Patterson, female    DOB: 02/27/1971  Age: 45 y.o. MRN: [id number]    Subjective:  Subjective  HPI Cheryl Patterson presents for f/u bronchitis.  Pt went to [facility] in July for bronchitis --- she was given prednisone and an abx but never finished the abx because it caused a yeast infection.   She is still coughing, nonproductive-- no wheezing, no fever.  Cough is worse at night in bed.   Pt was taking Mucinex dm  Review of Systems  Constitutional: Positive for chills. Negative for fever.  HENT: Positive for congestion, postnasal drip and sinus pressure. Negative for rhinorrhea.   Respiratory: Positive for cough, chest tightness and shortness of breath. Negative for wheezing.   Cardiovascular: Negative for chest pain, palpitations and leg swelling.  Allergic/Immunologic: Negative for environmental allergies.    History Past Medical History:  Diagnosis Date  . Allergy   . Anxiety   . Asthma   . Bipolar depression ([place])   . Chronic headaches   . Chronic kidney disease    kidney stones  . Depression    [facility]---suicidal ideation  . GERD (gastroesophageal reflux disease)   . Kidney infection   . Sepsis ([place])   . UTI (urinary tract infection)     She has a past surgical history that includes Tubal ligation; LEEP (2005); Breast surgery; Other surgical history; Mouth surgery; Lipoma excision; kidney stones; Laparoscopic vaginal hysterectomy with salpingo oophorectomy (N/A, 03/18/2015); Cystoscopy (N/A, 03/18/2015); and Laparoscopic bilateral salpingectomy (Bilateral, 03/18/2015).   Her family history includes Brain cancer in her maternal aunt; Breast cancer in her mother and paternal aunt; Cancer in her maternal aunt, maternal uncle, and paternal aunt; Cancer (age of onset: 64) in her mother; Colon cancer in her maternal aunt and mother; Diabetes in her maternal grandfather; Heart disease in her paternal grandmother; Hypertension in her paternal grandmother;  Stomach cancer in her maternal aunt; Stroke in her paternal grandmother; Uterine cancer in her maternal aunt.She reports that she has never smoked. She has never used smokeless tobacco. She reports that she does not drink alcohol or use drugs.  Current Outpatient Prescriptions on File Prior to Visit  Medication Sig Dispense Refill  . albuterol (PROVENTIL HFA;VENTOLIN HFA) 108 (90 BASE) MCG/ACT inhaler Inhale 2 puffs into the lungs every 6 (six) hours as needed for wheezing or shortness of breath. 1 Inhaler 2  . calcium carbonate (TUMS EX) 750 MG chewable tablet Chew 1 tablet by mouth daily. Reported on 04/03/2015    . diclofenac (VOLTAREN) 75 MG EC tablet Take 75 mg by mouth daily as needed (For back pain.). Reported on 04/03/2015    . diphenhydrAMINE (BENADRYL) 25 MG tablet Take 25 mg by mouth daily as needed for allergies or sleep. Reported on 05/02/2015    . ibuprofen (ADVIL,MOTRIN) 200 MG tablet Take 800 mg by mouth every 6 (six) hours as needed for moderate pain. Reported on 04/03/2015    . IRON PO Take 1 tablet by mouth daily. Reported on 05/02/2015    . meperidine (DEMEROL) 50 MG tablet Take 1 tablet (50 mg total) by mouth every 4 (four) hours as needed for severe pain. 30 tablet 0  . metoCLOPramide (REGLAN) 10 MG tablet Take 1 tablet (10 mg total) by mouth 3 (three) times daily with meals. 30 tablet 1  . fluconazole (DIFLUCAN) 150 MG tablet Take 1 tablet (150 mg total) by mouth once. (Patient not taking: Reported on 12/11/2015) 1 tablet 0  No current facility-administered medications on file prior to visit.      Objective:  Objective  Physical Exam  Constitutional: She is oriented to person, place, and time. She appears well-developed and well-nourished.  HENT:  Right Ear: External ear normal.  Left Ear: External ear normal.  + PND + errythema  Eyes: Conjunctivae are normal. Right eye exhibits no discharge. Left eye exhibits no discharge.  Cardiovascular: Normal rate, regular rhythm  and normal heart sounds.   No murmur heard. Pulmonary/Chest: Effort normal and breath sounds normal. No respiratory distress. She has no wheezes. She has no rales. She exhibits no tenderness.  Musculoskeletal: She exhibits no edema.  Neurological: She is alert and oriented to person, place, and time.  Nursing note and vitals reviewed.  BP 118/68 (BP Location: Left Arm, Patient Position: Sitting, Cuff Size: Normal)   Pulse 84   Temp 98.9 F (37.2 C) (Oral)   Wt 199 lb 6.4 oz (90.4 kg)   LMP 12/02/2014   SpO2 97%   BMI 32.18 kg/m  Wt Readings from Last 3 Encounters:  12/11/15 199 lb 6.4 oz (90.4 kg)  03/18/15 195 lb (88.5 kg)  03/11/15 195 lb 6 oz (88.6 kg)     Lab Results  Component Value Date   WBC 8.1 04/03/2015   HGB 12.4 04/03/2015   HCT 37.5 04/03/2015   PLT 370 04/03/2015   GLUCOSE 87 01/10/2015   CHOL 190 01/10/2015   TRIG 52.0 01/10/2015   HDL 40.20 01/10/2015   LDLDIRECT 144.3 06/25/2010   LDLCALC 139 (H) 01/10/2015   ALT 12 01/10/2015   AST 13 01/10/2015   NA 139 01/10/2015   K 4.6 01/10/2015   CL 104 01/10/2015   CREATININE 0.89 01/10/2015   BUN 14 01/10/2015   CO2 26 01/10/2015   TSH 1.13 01/10/2015    US Breast Ltd Uni Left Inc Axilla  Result Date: 06/12/2015 CLINICAL DATA:  Annual examination of both breasts and follow-up of probable fibroadenoma left breast. The patient is asymptomatic. She does not palpate a mass in the left breast. EXAM: DIGITAL DIAGNOSTIC BILATERAL MAMMOGRAM WITH 3D TOMOSYNTHESIS WITH CAD ULTRASOUND LEFT BREAST COMPARISON:  01/23/2014, 01/16/2014 ACR Breast Density Category c: The breast tissue is heterogeneously dense, which may obscure small masses. FINDINGS: The circumscribed mass in the far upper outer left breast posteriorly identified on the screening mammogram of 01/16/2014 and evaluated with ultrasound on January 23, 2014 has significantly decreased in size on mammography. No new or suspicious mass, distortion, or suspicious  microcalcifications identified in either breast to suggest malignancy. Mammographic images were processed with CAD. On physical exam, no mass is palpated in the 2 o'clock region of the left breast. Targeted ultrasound is performed, showing a circumscribed oval hypoechoic mass at 2 o'clock position 10 cm from the nipple. This mass now measures 1.2 x 0.7 x 0.9 cm. There is no internal vascular flow. This mass has significantly decreased in size on ultrasound, as it previously measured 1.8 x 2.1 x 1.2 cm in October of 2015. Ultrasound findings of interval decrease in size correlate well with the appearances on today's mammogram. IMPRESSION: Significant interval decrease in size of the circumscribed hypoechoic mass 2 o'clock position left breast. Findings are consistent with benignity. Fibroadenomas do not typically to decrease in size in this short time interval, so other considerations such as an intramammary lymph node or complicated cyst are also considered. No evidence of malignancy in either breast. Patient to return to the screening population. RECOMMENDATION: Screening  mammogram in one year.(Code:SM-B-01Y) I have discussed the findings and recommendations with the patient. Results were also provided in writing at the conclusion of the visit. If applicable, a reminder letter will be sent to the patient regarding the next appointment. BI-RADS CATEGORY  2: Benign. Electronically Signed   By: Curlene Dolphin M.D.   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 The patient is asymptomatic. She does not palpate a mass in the left breast. EXAM: DIGITAL DIAGNOSTIC BILATERAL MAMMOGRAM WITH 3D TOMOSYNTHESIS WITH CAD ULTRASOUND LEFT BREAST COMPARISON:  01/23/2014, 01/16/2014 ACR Breast Density Category c: The breast tissue is heterogeneously dense, which may obscure small masses.  FINDINGS: The circumscribed mass in the far upper outer left breast posteriorly identified on the screening mammogram of 01/16/2014 and evaluated with ultrasound on January 23, 2014 has significantly decreased in size on mammography. No new or suspicious mass, distortion, or suspicious microcalcifications identified in either breast to suggest malignancy. Mammographic images were processed with CAD. On physical exam, no mass is palpated in the 2 o'clock region of the left breast. Targeted ultrasound is performed, showing a circumscribed oval hypoechoic mass at 2 o'clock position 10 cm from the nipple. This mass now measures 1.2 x 0.7 x 0.9 cm. There is no internal vascular flow. This mass has significantly decreased in size on ultrasound, as it previously measured 1.8 x 2.1 x 1.2 cm in October of 2015. Ultrasound findings of interval decrease in size correlate well with the appearances on today's mammogram. IMPRESSION: Significant interval decrease in size of the circumscribed hypoechoic mass 2 o'clock position left breast. Findings are consistent with benignity. Fibroadenomas do not typically to decrease in size in this short time interval, so other considerations such as an intramammary lymph node or complicated cyst are also considered. No evidence of malignancy in either breast. Patient to return to the screening population. RECOMMENDATION: Screening mammogram in one year.(Code:SM-B-01Y) I have discussed the findings and recommendations with the patient. Results were also provided in writing at the conclusion of the visit. If applicable, a reminder letter will be sent to the patient regarding the next appointment. BI-RADS CATEGORY  2: Benign. Electronically Signed   By: Curlene Dolphin M.D.   On: 06/12/2015 11:08     Assessment & Plan:  Plan  I am having Cheryl Patterson start on azithromycin, promethazine-dextromethorphan, amphetamine-dextroamphetamine, and amphetamine-dextroamphetamine. I am also having her maintain  her albuterol, ibuprofen, diphenhydrAMINE, IRON PO, diclofenac, calcium carbonate, meperidine, metoCLOPramide, fluconazole, and amphetamine-dextroamphetamine.  Meds ordered this encounter  Medications  . azithromycin (ZITHROMAX Z-PAK) 250 MG tablet    Sig: As directed    Dispense:  6 each    Refill:  0  . promethazine-dextromethorphan (PROMETHAZINE-DM) 6.25-15 MG/5ML syrup    Sig: Take 5 mLs by mouth 4 (four) times daily as needed.    Dispense:  118 mL    Refill:  0  . amphetamine-dextroamphetamine (ADDERALL XR) 30 MG 24 hr capsule    Sig: Take 1 capsule (30 mg total) by mouth every morning.    Dispense:  30 capsule    Refill:  0    PLEASE DISPENSE BRAND NAME ONLY  . amphetamine-dextroamphetamine (ADDERALL XR) 30 MG 24 hr capsule    Sig: Take 1 capsule (30 mg total) by mouth every morning.    Dispense:  30 capsule    Refill:  0  Do not fill until Jan 11, 2016  . amphetamine-dextroamphetamine (ADDERALL XR) 30 MG 24 hr capsule    Sig: Take 1 capsule (30 mg total) by mouth every morning.    Dispense:  30 capsule    Refill:  0    Do not fill until Feb 10, 2016    Problem List Items Addressed This Visit    None    Visit Diagnoses    Cough    -  Primary   Relevant Orders   DG Chest 2 View (Completed)   Acute bronchitis due to other specified organisms       Relevant Medications   azithromycin (ZITHROMAX Z-PAK) 250 MG tablet   promethazine-dextromethorphan (PROMETHAZINE-DM) 6.25-15 MG/5ML syrup   Attention deficit hyperactivity disorder (ADHD), predominantly inattentive type       Relevant Medications   amphetamine-dextroamphetamine (ADDERALL XR) 30 MG 24 hr capsule   amphetamine-dextroamphetamine (ADDERALL XR) 30 MG 24 hr capsule   amphetamine-dextroamphetamine (ADDERALL XR) 30 MG 24 hr capsule      Follow-up: Return in about 6 months (around 06/12/2016).  Ann Held, DO

## 2016-04-01 ENCOUNTER — Ambulatory Visit: Payer: BLUE CROSS/BLUE SHIELD | Admitting: Family Medicine

## 2016-04-02 ENCOUNTER — Encounter: Payer: Self-pay | Admitting: Family Medicine

## 2016-04-02 ENCOUNTER — Telehealth: Payer: Self-pay

## 2016-04-02 ENCOUNTER — Ambulatory Visit (INDEPENDENT_AMBULATORY_CARE_PROVIDER_SITE_OTHER): Payer: BLUE CROSS/BLUE SHIELD | Admitting: Family Medicine

## 2016-04-02 VITALS — BP 108/78 | HR 84 | Temp 98.9°F | Resp 16 | Ht 67.0 in | Wt 205.4 lb

## 2016-04-02 DIAGNOSIS — Z800 Family history of malignant neoplasm of digestive organs: Secondary | ICD-10-CM

## 2016-04-02 DIAGNOSIS — F988 Other specified behavioral and emotional disorders with onset usually occurring in childhood and adolescence: Secondary | ICD-10-CM | POA: Diagnosis not present

## 2016-04-02 DIAGNOSIS — R32 Unspecified urinary incontinence: Secondary | ICD-10-CM

## 2016-04-02 LAB — POC URINALSYSI DIPSTICK (AUTOMATED)
BILIRUBIN UA: NEGATIVE
Blood, UA: NEGATIVE
GLUCOSE UA: NEGATIVE
KETONES UA: NEGATIVE
Leukocytes, UA: NEGATIVE
Nitrite, UA: NEGATIVE
Protein, UA: NEGATIVE
SPEC GRAV UA: 1.025
Urobilinogen, UA: 0.2
pH, UA: 6

## 2016-04-02 MED ORDER — AMPHETAMINE-DEXTROAMPHETAMINE 30 MG PO TABS: 30.0000 mg | ORAL_TABLET | Freq: Every day | ORAL | 0 refills | Status: DC

## 2016-04-02 MED ORDER — SOLIFENACIN SUCCINATE 10 MG PO TABS: 10.0000 mg | ORAL_TABLET | Freq: Every day | ORAL | 2 refills | Status: DC

## 2016-04-02 NOTE — Progress Notes (Signed)
Patient ID: Cheryl Patterson, female    DOB: July 04, 1970  Age: 45 y.o. MRN: [id number]    Subjective:  Subjective  HPI Cheryl Patterson presents for f/u add.  She is doing well with the med but the xr is too expensive and she would like to go back to immediate release She is also c/o incontinence of urine, esp at night.  No dysuria, no blood, no pelvic pain. She is also concerned because her mom passed away from colon cancer and she is concerned she may need a colonoscopy sooner.  Review of Systems  Constitutional: Negative for activity change, appetite change, fatigue and unexpected weight change.  Respiratory: Negative for cough and shortness of breath.   Cardiovascular: Negative for chest pain and palpitations.  Psychiatric/Behavioral: Negative for behavioral problems and dysphoric mood. The patient is not nervous/anxious.     History Past Medical History:  Diagnosis Date  . Allergy   . Anxiety   . Asthma   . Bipolar depression ([place])   . Chronic headaches   . Chronic kidney disease    kidney stones  . Depression    [facility]---suicidal ideation  . GERD (gastroesophageal reflux disease)   . Kidney infection   . Sepsis ([place])   . UTI (urinary tract infection)     She has a past surgical history that includes Tubal ligation; LEEP (2005); Breast surgery; Other surgical history; Mouth surgery; Lipoma excision; kidney stones; Laparoscopic vaginal hysterectomy with salpingo oophorectomy (N/A, 03/18/2015); Cystoscopy (N/A, 03/18/2015); and Laparoscopic bilateral salpingectomy (Bilateral, 03/18/2015).   Her family history includes Brain cancer in her maternal aunt; Breast cancer in her mother and paternal aunt; Cancer in her maternal aunt, maternal uncle, and paternal aunt; Cancer (age of onset: 24) in her mother; Colon cancer in her maternal aunt; Colon cancer (age of onset: 40) in her mother; Diabetes in her maternal grandfather; Heart disease in her paternal grandmother;  Hypertension in her paternal grandmother; Stomach cancer in her maternal aunt; Stroke in her paternal grandmother; Uterine cancer in her maternal aunt.She reports that she has never smoked. She has never used smokeless tobacco. She reports that she does not drink alcohol or use drugs.  Current Outpatient Prescriptions on File Prior to Visit  Medication Sig Dispense Refill  . albuterol (PROVENTIL HFA;VENTOLIN HFA) 108 (90 BASE) MCG/ACT inhaler Inhale 2 puffs into the lungs every 6 (six) hours as needed for wheezing or shortness of breath. 1 Inhaler 2  . calcium carbonate (TUMS EX) 750 MG chewable tablet Chew 1 tablet by mouth daily. Reported on 04/03/2015    . diclofenac (VOLTAREN) 75 MG EC tablet Take 75 mg by mouth daily as needed (For back pain.). Reported on 04/03/2015    . diphenhydrAMINE (BENADRYL) 25 MG tablet Take 25 mg by mouth daily as needed for allergies or sleep. Reported on 05/02/2015    . fluconazole (DIFLUCAN) 150 MG tablet Take 1 tablet (150 mg total) by mouth once. 1 tablet 0  . ibuprofen (ADVIL,MOTRIN) 200 MG tablet Take 800 mg by mouth every 6 (six) hours as needed for moderate pain. Reported on 04/03/2015    . IRON PO Take 1 tablet by mouth daily. Reported on 05/02/2015    . meperidine (DEMEROL) 50 MG tablet Take 1 tablet (50 mg total) by mouth every 4 (four) hours as needed for severe pain. 30 tablet 0  . metoCLOPramide (REGLAN) 10 MG tablet Take 1 tablet (10 mg total) by mouth 3 (three) times daily with meals.  30 tablet 1  . promethazine-dextromethorphan (PROMETHAZINE-DM) 6.25-15 MG/5ML syrup Take 5 mLs by mouth 4 (four) times daily as needed. 118 mL 0  . azithromycin (ZITHROMAX Z-PAK) 250 MG tablet As directed (Patient not taking: Reported on 04/02/2016) 6 each 0   No current facility-administered medications on file prior to visit.      Objective:  Objective  Physical Exam  Constitutional: She is oriented to person, place, and time. She appears well-developed and  well-nourished.  HENT:  Head: Normocephalic and atraumatic.  Eyes: Conjunctivae and EOM are normal.  Neck: Normal range of motion. Neck supple. No JVD present. Carotid bruit is not present. No thyromegaly present.  Cardiovascular: Normal rate, regular rhythm and normal heart sounds.   No murmur heard. Pulmonary/Chest: Effort normal and breath sounds normal. No respiratory distress. She has no wheezes. She has no rales. She exhibits no tenderness.  Musculoskeletal: She exhibits no edema.  Neurological: She is alert and oriented to person, place, and time.  Psychiatric: She has a normal mood and affect. Her behavior is normal. Judgment and thought content normal.  Nursing note and vitals reviewed.  BP 108/78 (BP Location: Right Arm, Patient Position: Sitting, Cuff Size: Large)   Pulse 84   Temp 98.9 F (37.2 C) (Oral)   Resp 16   Ht 5\' 7"  (1.702 m)   Wt 205 lb 6.4 oz (93.2 kg)   LMP 12/02/2014   SpO2 98%   BMI 32.17 kg/m  Wt Readings from Last 3 Encounters:  04/02/16 205 lb 6.4 oz (93.2 kg)  12/11/15 199 lb 6.4 oz (90.4 kg)  03/18/15 195 lb (88.5 kg)     Lab Results  Component Value Date   WBC 8.1 04/03/2015   HGB 12.4 04/03/2015   HCT 37.5 04/03/2015   PLT 370 04/03/2015   GLUCOSE 87 01/10/2015   CHOL 190 01/10/2015   TRIG 52.0 01/10/2015   HDL 40.20 01/10/2015   LDLDIRECT 144.3 06/25/2010   LDLCALC 139 (H) 01/10/2015   ALT 12 01/10/2015   AST 13 01/10/2015   NA 139 01/10/2015   K 4.6 01/10/2015   CL 104 01/10/2015   CREATININE 0.89 01/10/2015   BUN 14 01/10/2015   CO2 26 01/10/2015   TSH 1.13 01/10/2015    Dg Chest 2 View  Result Date: 12/11/2015 CLINICAL DATA:  Cough for 2 months, bronchitis EXAM: CHEST  2 VIEW COMPARISON:  08/23/2014 FINDINGS: The heart size and mediastinal contours are within normal limits. Both lungs are clear. The visualized skeletal structures are unremarkable. IMPRESSION: No active cardiopulmonary disease. Electronically Signed   By:  Cheryl Patterson M.D.   On: 12/11/2015 15:24     Assessment & Plan:  Plan  I have discontinued Ms. Cheryl Patterson's amphetamine-dextroamphetamine, amphetamine-dextroamphetamine, and amphetamine-dextroamphetamine. I am also having her start on amphetamine-dextroamphetamine, amphetamine-dextroamphetamine, amphetamine-dextroamphetamine, and solifenacin. Additionally, I am having her maintain her albuterol, ibuprofen, diphenhydrAMINE, IRON PO, diclofenac, calcium carbonate, meperidine, metoCLOPramide, fluconazole, azithromycin, and promethazine-dextromethorphan.  Meds ordered this encounter  Medications  . amphetamine-dextroamphetamine (ADDERALL) 30 MG tablet    Sig: Take 1 tablet by mouth daily.    Dispense:  30 tablet    Refill:  0  . amphetamine-dextroamphetamine (ADDERALL) 30 MG tablet    Sig: Take 1 tablet by mouth daily.    Dispense:  30 tablet    Refill:  0    Do not fill until Jan 2018  . amphetamine-dextroamphetamine (ADDERALL) 30 MG tablet    Sig: Take 1 tablet by mouth daily. Do  not fill until Feb 2018    Dispense:  30 tablet    Refill:  0  . solifenacin (VESICARE) 10 MG tablet    Sig: Take 1 tablet (10 mg total) by mouth daily.    Dispense:  30 tablet    Refill:  2    Problem List Items Addressed This Visit    None    Visit Diagnoses    Attention deficit disorder, unspecified hyperactivity presence    -  Primary   Relevant Medications   amphetamine-dextroamphetamine (ADDERALL) 30 MG tablet   amphetamine-dextroamphetamine (ADDERALL) 30 MG tablet   amphetamine-dextroamphetamine (ADDERALL) 30 MG tablet   Incontinence in female       Relevant Medications   solifenacin (VESICARE) 10 MG tablet   Other Relevant Orders   POCT Urinalysis Dipstick (Automated) (Completed)   Family history of colon cancer        sent message to GI about new fam hx at pt request Follow-up: Return in about 6 months (around 10/01/2016) for annual exam, fasting.  Ann Held, DO

## 2016-04-02 NOTE — Progress Notes (Signed)
Pre visit review using our clinic review tool, if applicable. No additional management support is needed unless otherwise documented below in the visit note. 

## 2016-04-02 NOTE — Patient Instructions (Signed)
Urinary Incontinence Urinary incontinence is the involuntary loss of urine from your bladder. CAUSES  There are many causes of urinary incontinence. They include:  Medicines.  Infections.  Prostatic enlargement, leading to overflow of urine from your bladder.  Surgery.  Neurological diseases.  Emotional factors. SIGNS AND SYMPTOMS Urinary Incontinence can be divided into four types: 1. Urge incontinence. Urge incontinence is the involuntary loss of urine before you have the opportunity to go to the bathroom. There is a sudden urge to void but not enough time to reach a bathroom. 2. Stress incontinence. Stress incontinence is the sudden loss of urine with any activity that forces urine to pass. It is commonly caused by anatomical changes to the pelvis and sphincter areas of your body. 3. Overflow incontinence. Overflow incontinence is the loss of urine from an obstructed opening to your bladder. This results in a backup of urine and a resultant buildup of pressure within the bladder. When the pressure within the bladder exceeds the closing pressure of the sphincter, the urine overflows, which causes incontinence, similar to water overflowing a dam. 4. Total incontinence. Total incontinence is the loss of urine as a result of the inability to store urine within your bladder. DIAGNOSIS  Evaluating the cause of incontinence may require:  A thorough and complete medical and obstetric history.  A complete physical exam.  Laboratory tests such as a urine culture and sensitivities. When additional tests are indicated, they can include:  An ultrasound exam.  Kidney and bladder X-rays.  Cystoscopy. This is an exam of the bladder using a narrow scope.  Urodynamic testing to test the nerve function to the bladder and sphincter areas. TREATMENT  Treatment for urinary incontinence depends on the cause:  For urge incontinence caused by a bacterial infection, antibiotics will be prescribed.  If the urge incontinence is related to medicines you take, your health care provider may have you change the medicine.  For stress incontinence, surgery to re-establish anatomical support to the bladder or sphincter, or both, will often correct the condition.  For overflow incontinence caused by an enlarged prostate, an operation to open the channel through the enlarged prostate will allow the flow of urine out of the bladder. In women with fibroids, a hysterectomy may be recommended.  For total incontinence, surgery on your urinary sphincter may help. An artificial urinary sphincter (an inflatable cuff placed around the urethra) may be required. In women who have developed a hole-like passage between their bladder and vagina (vesicovaginal fistula), surgery to close the fistula often is required. HOME CARE INSTRUCTIONS  Normal daily hygiene and the use of pads or adult diapers that are changed regularly will help prevent odors and skin damage.  Avoid caffeine. It can overstimulate your bladder.  Use the bathroom regularly. Try about every 2-3 hours to go to the bathroom, even if you do not feel the need to do so. Take time to empty your bladder completely. After urinating, wait a minute. Then try to urinate again.  For causes involving nerve dysfunction, keep a log of the medicines you take and a journal of the times you go to the bathroom. SEEK MEDICAL CARE IF:  You experience worsening of pain instead of improvement in pain after your procedure.  Your incontinence becomes worse instead of better. SEE IMMEDIATE MEDICAL CARE IF:  You experience fever or shaking chills.  You are unable to pass your urine.  You have redness spreading into your groin or down into your thighs. MAKE SURE   YOU:  ?Understand these instructions.   Will watch your condition.  Will get help right away if you are not doing well or get worse. This information is not intended to replace advice given to you by  your health care provider. Make sure you discuss any questions you have with your health care provider. Document Released: 05/13/2004 Document Revised: 04/26/2014 Document Reviewed: 09/12/2012 Elsevier Interactive Patient Education  2017 Elsevier Inc.  

## 2016-04-02 NOTE — Telephone Encounter (Signed)
-----   Message from Milus Banister, MD sent at 04/02/2016 12:56 PM EST ----- Got it, thanks. When I first read your message I thought I had missed a cancer that was diagnosed 1 year later. That would be bad, obviously. Thanks for clarifying.  As you know, Haylea now has a significant FH of colon cancer and so should have colonoscopy 5 years from her 2013 exam instead of 10.  We will contact her about that and get it scheduled.  Thanks  Saje Gallop, Can you contact her, let her know that Dr. Etter Sjogren informed us of her mother having colon cancer and that means that she needs colonoscopy in next few months since now she has a signficant FH of colon cancer.  ([place]).  Thanks   ----- Message ----- From: Ann Held, DO Sent: 04/02/2016  12:38 PM To: Milus Banister, MD  You did not --- Dr Burr Medico was her oncologist at [facility], Dr Zella Richer was surgeon-----her name is carolyn Joya Gaskins  dob 01/09/52---Kerriann was POA with her sister -- she said she is ok with you looking it up.  yvonne ----- Message ----- From: Milus Banister, MD Sent: 04/02/2016  10:30 AM To: [place], Can you clarify?  Did I do a colonoscopy on the mother as well?  If so, what was her name dob, I'd like to look into it.     DJ     ----- Message ----- From: Ann Held, DO Sent: 04/02/2016  10:04 AM To: Milus Banister, MD  The patient wanted me to inform you that her mother died last year from colon cancer at age 69 The diagnosis was made in 2014 after her colonoscopy with you  Have a great Holiday!   Kendrick Fries

## 2016-04-05 NOTE — Telephone Encounter (Signed)
Left message on machine to call back  

## 2016-04-08 NOTE — Telephone Encounter (Signed)
Pt has been notified of the family history of colon cancer and the need for Previsit and colon.  Both have been scheduled and pt aware

## 2016-04-20 ENCOUNTER — Ambulatory Visit (AMBULATORY_SURGERY_CENTER): Payer: BLUE CROSS/BLUE SHIELD | Admitting: *Deleted

## 2016-04-20 VITALS — Ht 67.0 in | Wt 213.4 lb

## 2016-04-20 DIAGNOSIS — Z800 Family history of malignant neoplasm of digestive organs: Secondary | ICD-10-CM

## 2016-04-20 MED ORDER — NA SULFATE-K SULFATE-MG SULF 17.5-3.13-1.6 GM/177ML PO SOLN: ORAL | 0 refills | Status: DC

## 2016-04-20 NOTE — Progress Notes (Signed)
No egg or soy allergy  No anesthesia or intubation problems per pt  No diet medications taken  No home oxygen used or hx of sleep apnea  $15 off Suprep coupon given to pt

## 2016-04-27 ENCOUNTER — Encounter: Payer: Self-pay | Admitting: Gastroenterology

## 2016-04-27 ENCOUNTER — Ambulatory Visit (AMBULATORY_SURGERY_CENTER): Payer: BLUE CROSS/BLUE SHIELD | Admitting: Gastroenterology

## 2016-04-27 VITALS — BP 134/52 | HR 92 | Temp 98.2°F | Resp 25 | Ht 67.0 in | Wt 213.0 lb

## 2016-04-27 DIAGNOSIS — Z800 Family history of malignant neoplasm of digestive organs: Secondary | ICD-10-CM

## 2016-04-27 DIAGNOSIS — Z1212 Encounter for screening for malignant neoplasm of rectum: Secondary | ICD-10-CM | POA: Diagnosis not present

## 2016-04-27 DIAGNOSIS — Z1211 Encounter for screening for malignant neoplasm of colon: Secondary | ICD-10-CM | POA: Diagnosis not present

## 2016-04-27 MED ORDER — SODIUM CHLORIDE 0.9 % IV SOLN: 500.0000 mL | INTRAVENOUS | Status: DC

## 2016-04-27 NOTE — Patient Instructions (Signed)
YOU HAD AN ENDOSCOPIC PROCEDURE TODAY AT THE [facility]:   Refer to the procedure report that was given to you for any specific questions about what was found during the examination.  If the procedure report does not answer your questions, please call your gastroenterologist to clarify.  If you requested that your care partner not be given the details of your procedure findings, then the procedure report has been included in a sealed envelope for you to review at your convenience later.  YOU SHOULD EXPECT: Some feelings of bloating in the abdomen. Passage of more gas than usual.  Walking can help get rid of the air that was put into your GI tract during the procedure and reduce the bloating. If you had a lower endoscopy (such as a colonoscopy or flexible sigmoidoscopy) you may notice spotting of blood in your stool or on the toilet paper. If you underwent a bowel prep for your procedure, you may not have a normal bowel movement for a few days.  Please Note:  You might notice some irritation and congestion in your nose or some drainage.  This is from the oxygen used during your procedure.  There is no need for concern and it should clear up in a day or so.  SYMPTOMS TO REPORT IMMEDIATELY:   Following lower endoscopy (colonoscopy or flexible sigmoidoscopy):  Excessive amounts of blood in the stool  Significant tenderness or worsening of abdominal pains  Swelling of the abdomen that is new, acute  Fever of 100F or higher  For urgent or emergent issues, a gastroenterologist can be reached at any hour by calling [phone].   DIET:  We do recommend a small meal at first, but then you may proceed to your regular diet.  Drink plenty of fluids but you should avoid alcoholic beverages for 24 hours.  ACTIVITY:  You should plan to take it easy for the rest of today and you should NOT DRIVE or use heavy machinery until tomorrow (because of the sedation medicines used during the test).     FOLLOW UP: Our staff will call the number listed on your records the next business day following your procedure to check on you and address any questions or concerns that you may have regarding the information given to you following your procedure. If we do not reach you, we will leave a message.  However, if you are feeling well and you are not experiencing any problems, there is no need to return our call.  We will assume that you have returned to your regular daily activities without incident.  If any biopsies were taken you will be contacted by phone or by letter within the next 1-3 weeks.  Please call us at [phone] if you have not heard about the biopsies in 3 weeks.    SIGNATURES/CONFIDENTIALITY: You and/or your care partner have signed paperwork which will be entered into your electronic medical record.  These signatures attest to the fact that that the information above on your After Visit Summary has been reviewed and is understood.  Full responsibility of the confidentiality of this discharge information lies with you and/or your care-partner.  Read all of the handouts given to you by your recovery room nurse today. 

## 2016-04-27 NOTE — Progress Notes (Signed)
Report to PACU, RN, vss, BBS= Clear.  

## 2016-04-27 NOTE — Op Note (Signed)
[place] Patient Name: Cheryl Patterson Procedure Date: 04/27/2016 3:27 PM MRN: [id number] Endoscopist: Milus Banister , MD Age: 46 Referring MD:  Date of Birth: 1970/04/30 Gender: Female Account #: [id number] Procedure:                Colonoscopy Indications:              Screening in patient at increased risk: Family                            history of 1st-degree relative with colorectal                            cancer (mother at age 43), colonoscopy 2013 with [facility]                            polyp Medicines:                Monitored Anesthesia Care Procedure:                Pre-Anesthesia Assessment:                           - Prior to the procedure, a History and Physical                            was performed, and patient medications and                            allergies were reviewed. The patient's tolerance of                            previous anesthesia was also reviewed. The risks                            and benefits of the procedure and the sedation                            options and risks were discussed with the patient.                            All questions were answered, and informed consent                            was obtained. Prior Anticoagulants: The patient has                            taken no previous anticoagulant or antiplatelet                            agents. ASA Grade Assessment: II - A patient with                            mild systemic disease. After reviewing the risks  and benefits, the patient was deemed in                            satisfactory condition to undergo the procedure.                           After obtaining informed consent, the colonoscope                            was passed under direct vision. Throughout the                            procedure, the patient's blood pressure, pulse, and                            oxygen saturations were monitored continuously. The                     Colonoscope was introduced through the anus and                            advanced to the the cecum, identified by                            appendiceal orifice and ileocecal valve. The                            colonoscopy was performed without difficulty. The                            patient tolerated the procedure well. The quality                            of the bowel preparation was good. The ileocecal                            valve, appendiceal orifice, and rectum were                            photographed. Scope In: 3:35:48 PM Scope Out: 3:45:02 PM Scope Withdrawal Time: 0 hours 5 minutes 28 seconds  Total Procedure Duration: 0 hours 9 minutes 14 seconds  Findings:                 The entire examined colon appeared normal on direct                            and retroflexion views. Complications:            No immediate complications. Estimated blood loss:                            None. Estimated Blood Loss:     Estimated blood loss: none. Impression:               - The entire examined colon is normal on direct and  retroflexion views.                           - No polyps or cancers. Recommendation:           - Patient has a contact number available for                            emergencies. The signs and symptoms of potential                            delayed complications were discussed with the                            patient. Return to normal activities tomorrow.                            Written discharge instructions were provided to the                            patient.                           - Resume previous diet.                           - Continue present medications.                           - Repeat colonoscopy in 5 years for screening                            purposes. Milus Banister, MD 04/27/2016 3:48:42 PM This report has been signed electronically.

## 2016-04-28 ENCOUNTER — Telehealth: Payer: Self-pay

## 2016-04-28 NOTE — Telephone Encounter (Signed)
  Follow up Call-  Call back number 04/27/2016  Post procedure Call Back phone  # [phone]  Permission to leave phone message Yes  Some recent data might be hidden     Patient questions:  Do you have a fever, pain , or abdominal swelling? No. Pain Score  0 *  Have you tolerated food without any problems? Yes.    Have you been able to return to your normal activities? Yes.    Do you have any questions about your discharge instructions: Diet   No. Medications  No. Follow up visit  No.  Do you have questions or concerns about your Care? No.  Actions: * If pain score is 4 or above: No action needed, pain <4.

## 2016-07-20 ENCOUNTER — Encounter: Payer: Self-pay | Admitting: Women's Health

## 2016-07-20 ENCOUNTER — Ambulatory Visit (INDEPENDENT_AMBULATORY_CARE_PROVIDER_SITE_OTHER): Payer: BLUE CROSS/BLUE SHIELD | Admitting: Women's Health

## 2016-07-20 VITALS — BP 132/80 | Ht 67.0 in | Wt 207.0 lb

## 2016-07-20 DIAGNOSIS — B3731 Acute candidiasis of vulva and vagina: Secondary | ICD-10-CM

## 2016-07-20 DIAGNOSIS — Z13220 Encounter for screening for lipoid disorders: Secondary | ICD-10-CM | POA: Diagnosis not present

## 2016-07-20 DIAGNOSIS — B373 Candidiasis of vulva and vagina: Secondary | ICD-10-CM | POA: Diagnosis not present

## 2016-07-20 DIAGNOSIS — Z01419 Encounter for gynecological examination (general) (routine) without abnormal findings: Secondary | ICD-10-CM | POA: Diagnosis not present

## 2016-07-20 LAB — CBC WITH DIFFERENTIAL/PLATELET
BASOS PCT: 0 %
Basophils Absolute: 0 cells/uL (ref 0–200)
EOS ABS: 51 {cells}/uL (ref 15–500)
Eosinophils Relative: 1 %
HEMATOCRIT: 40.3 % (ref 35.0–45.0)
HEMOGLOBIN: 13.2 g/dL (ref 11.7–15.5)
Lymphocytes Relative: 31 %
Lymphs Abs: 1581 cells/uL (ref 850–3900)
MCH: 28.3 pg (ref 27.0–33.0)
MCHC: 32.8 g/dL (ref 32.0–36.0)
MCV: 86.5 fL (ref 80.0–100.0)
MONO ABS: 255 {cells}/uL (ref 200–950)
MPV: 9.1 fL (ref 7.5–12.5)
Monocytes Relative: 5 %
NEUTROS ABS: 3213 {cells}/uL (ref 1500–7800)
Neutrophils Relative %: 63 %
Platelets: 313 10*3/uL (ref 140–400)
RBC: 4.66 MIL/uL (ref 3.80–5.10)
RDW: 13.6 % (ref 11.0–15.0)
WBC: 5.1 10*3/uL (ref 3.8–10.8)

## 2016-07-20 LAB — WET PREP FOR TRICH, YEAST, CLUE
Clue Cells Wet Prep HPF POC: NONE SEEN
TRICH WET PREP: NONE SEEN
Yeast Wet Prep HPF POC: NONE SEEN

## 2016-07-20 LAB — LIPID PANEL
Cholesterol: 219 mg/dL — ABNORMAL HIGH (ref <200)
HDL: 49 mg/dL — AB (ref >50)
LDL CALC: 157 mg/dL — AB (ref <100)
TRIGLYCERIDES: 67 mg/dL (ref <150)
Total CHOL/HDL Ratio: 4.5 Ratio (ref <5.0)
VLDL: 13 mg/dL (ref <30)

## 2016-07-20 LAB — COMPREHENSIVE METABOLIC PANEL
ALBUMIN: 3.9 g/dL (ref 3.6–5.1)
ALT: 10 U/L (ref 6–29)
AST: 14 U/L (ref 10–35)
Alkaline Phosphatase: 84 U/L (ref 33–115)
BUN: 7 mg/dL (ref 7–25)
CALCIUM: 9.2 mg/dL (ref 8.6–10.2)
CHLORIDE: 102 mmol/L (ref 98–110)
CO2: 24 mmol/L (ref 20–31)
Creat: 0.84 mg/dL (ref 0.50–1.10)
Glucose, Bld: 92 mg/dL (ref 65–99)
POTASSIUM: 4.2 mmol/L (ref 3.5–5.3)
Sodium: 138 mmol/L (ref 135–146)
TOTAL PROTEIN: 7.1 g/dL (ref 6.1–8.1)
Total Bilirubin: 0.3 mg/dL (ref 0.2–1.2)

## 2016-07-20 MED ORDER — FLUCONAZOLE 150 MG PO TABS: 150.0000 mg | ORAL_TABLET | Freq: Once | ORAL | 1 refills | Status: AC

## 2016-07-20 NOTE — Patient Instructions (Addendum)
Health Maintenance, Female Adopting a healthy lifestyle and getting preventive care can go a long way to promote health and wellness. Talk with your health care provider about what schedule of regular examinations is right for you. This is a good chance for you to check in with your provider about disease prevention and staying healthy. In between checkups, there are plenty of things you can do on your own. Experts have done a lot of research about which lifestyle changes and preventive measures are most likely to keep you healthy. Ask your health care provider for more information. Weight and diet Eat a healthy diet  Be sure to include plenty of vegetables, fruits, low-fat dairy products, and lean protein.  Do not eat a lot of foods high in solid fats, added sugars, or salt.  Get regular exercise. This is one of the most important things you can do for your health.  Most adults should exercise for at least 150 minutes each week. The exercise should increase your heart rate and make you sweat (moderate-intensity exercise).  Most adults should also do strengthening exercises at least twice a week. This is in addition to the moderate-intensity exercise. Maintain a healthy weight  Body mass index (BMI) is a measurement that can be used to identify possible weight problems. It estimates body fat based on height and weight. Your health care provider can help determine your BMI and help you achieve or maintain a healthy weight.  For females 76 years of age and older:  A BMI below 18.5 is considered underweight.  A BMI of 18.5 to 24.9 is normal.  A BMI of 25 to 29.9 is considered overweight.  A BMI of 30 and above is considered obese. Watch levels of cholesterol and blood lipids  You should start having your blood tested for lipids and cholesterol at 46 years of age, then have this test every 5 years.  You may need to have your cholesterol levels checked more often if:  Your lipid or  cholesterol levels are high.  You are older than 46 years of age.  You are at high risk for heart disease. Cancer screening Lung Cancer  Lung cancer screening is recommended for adults 64-42 years old who are at high risk for lung cancer because of a history of smoking.  A yearly low-dose CT scan of the lungs is recommended for people who:  Currently smoke.  Have quit within the past 15 years.  Have at least a 30-pack-year history of smoking. A pack year is smoking an average of one pack of cigarettes a day for 1 year.  Yearly screening should continue until it has been 15 years since you quit.  Yearly screening should stop if you develop a health problem that would prevent you from having lung cancer treatment. Breast Cancer  Practice breast self-awareness. This means understanding how your breasts normally appear and feel.  It also means doing regular breast self-exams. Let your health care provider know about any changes, no matter how small.  If you are in your 20s or 30s, you should have a clinical breast exam (CBE) by a health care provider every 1-3 years as part of a regular health exam.  If you are 34 or older, have a CBE every year. Also consider having a breast X-ray (mammogram) every year.  If you have a family history of breast cancer, talk to your health care provider about genetic screening.  If you are at high risk for breast cancer, talk  to your health care provider about having an MRI and a mammogram every year.  Breast cancer gene (BRCA) assessment is recommended for women who have family members with BRCA-related cancers. BRCA-related cancers include:  Breast.  Ovarian.  Tubal.  Peritoneal cancers.  Results of the assessment will determine the need for genetic counseling and BRCA1 and BRCA2 testing. Cervical Cancer  Your health care provider may recommend that you be screened regularly for cancer of the pelvic organs (ovaries, uterus, and vagina).  This screening involves a pelvic examination, including checking for microscopic changes to the surface of your cervix (Pap test). You may be encouraged to have this screening done every 3 years, beginning at age 24.  For women ages 66-65, health care providers may recommend pelvic exams and Pap testing every 3 years, or they may recommend the Pap and pelvic exam, combined with testing for human papilloma virus (HPV), every 5 years. Some types of HPV increase your risk of cervical cancer. Testing for HPV may also be done on women of any age with unclear Pap test results.  Other health care providers may not recommend any screening for nonpregnant women who are considered low risk for pelvic cancer and who do not have symptoms. Ask your health care provider if a screening pelvic exam is right for you.  If you have had past treatment for cervical cancer or a condition that could lead to cancer, you need Pap tests and screening for cancer for at least 20 years after your treatment. If Pap tests have been discontinued, your risk factors (such as having a new sexual partner) need to be reassessed to determine if screening should resume. Some women have medical problems that increase the chance of getting cervical cancer. In these cases, your health care provider may recommend more frequent screening and Pap tests. Colorectal Cancer  This type of cancer can be detected and often prevented.  Routine colorectal cancer screening usually begins at 46 years of age and continues through 46 years of age.  Your health care provider may recommend screening at an earlier age if you have risk factors for colon cancer.  Your health care provider may also recommend using home test kits to check for hidden blood in the stool.  A small camera at the end of a tube can be used to examine your colon directly (sigmoidoscopy or colonoscopy). This is done to check for the earliest forms of colorectal cancer.  Routine  screening usually begins at age 41.  Direct examination of the colon should be repeated every 5-10 years through 46 years of age. However, you may need to be screened more often if early forms of precancerous polyps or small growths are found. Skin Cancer  Check your skin from head to toe regularly.  Tell your health care provider about any new moles or changes in moles, especially if there is a change in a mole's shape or color.  Also tell your health care provider if you have a mole that is larger than the size of a pencil eraser.  Always use sunscreen. Apply sunscreen liberally and repeatedly throughout the day.  Protect yourself by wearing long sleeves, pants, a wide-brimmed hat, and sunglasses whenever you are outside. Heart disease, diabetes, and high blood pressure  High blood pressure causes heart disease and increases the risk of stroke. High blood pressure is more likely to develop in:  People who have blood pressure in the high end of the normal range (130-139/85-89 mm Hg).  People who are overweight or obese.  People who are African American.  If you are 59-24 years of age, have your blood pressure checked every 3-5 years. If you are 34 years of age or older, have your blood pressure checked every year. You should have your blood pressure measured twice-once when you are at a hospital or clinic, and once when you are not at a hospital or clinic. Record the average of the two measurements. To check your blood pressure when you are not at a hospital or clinic, you can use:  An automated blood pressure machine at a pharmacy.  A home blood pressure monitor.  If you are between 29 years and 60 years old, ask your health care provider if you should take aspirin to prevent strokes.  Have regular diabetes screenings. This involves taking a blood sample to check your fasting blood sugar level.  If you are at a normal weight and have a low risk for diabetes, have this test once  every three years after 46 years of age.  If you are overweight and have a high risk for diabetes, consider being tested at a younger age or more often. Preventing infection Hepatitis B  If you have a higher risk for hepatitis B, you should be screened for this virus. You are considered at high risk for hepatitis B if:  You were born in a country where hepatitis B is common. Ask your health care provider which countries are considered high risk.  Your parents were born in a high-risk country, and you have not been immunized against hepatitis B (hepatitis B vaccine).  You have HIV or AIDS.  You use needles to inject street drugs.  You live with someone who has hepatitis B.  You have had sex with someone who has hepatitis B.  You get hemodialysis treatment.  You take certain medicines for conditions, including cancer, organ transplantation, and autoimmune conditions. Hepatitis C  Blood testing is recommended for:  Everyone born from [birth year] through 1965.  Anyone with known risk factors for hepatitis C. Sexually transmitted infections (STIs)  You should be screened for sexually transmitted infections (STIs) including gonorrhea and chlamydia if:  You are sexually active and are younger than 46 years of age.  You are older than 46 years of age and your health care provider tells you that you are at risk for this type of infection.  Your sexual activity has changed since you were last screened and you are at an increased risk for chlamydia or gonorrhea. Ask your health care provider if you are at risk.  If you do not have HIV, but are at risk, it may be recommended that you take a prescription medicine daily to prevent HIV infection. This is called pre-exposure prophylaxis (PrEP). You are considered at risk if:  You are sexually active and do not regularly use condoms or know the HIV status of your partner(s).  You take drugs by injection.  You are sexually active with a partner  who has HIV. Talk with your health care provider about whether you are at high risk of being infected with HIV. If you choose to begin PrEP, you should first be tested for HIV. You should then be tested every 3 months for as long as you are taking PrEP. Pregnancy  If you are premenopausal and you may become pregnant, ask your health care provider about preconception counseling.  If you may become pregnant, take 400 to 800 micrograms (mcg) of folic acid  every day.  If you want to prevent pregnancy, talk to your health care provider about birth control (contraception). Osteoporosis and menopause  Osteoporosis is a disease in which the bones lose minerals and strength with aging. This can result in serious bone fractures. Your risk for osteoporosis can be identified using a bone density scan.  If you are 65 years of age or older, or if you are at risk for osteoporosis and fractures, ask your health care provider if you should be screened.  Ask your health care provider whether you should take a calcium or vitamin D supplement to lower your risk for osteoporosis.  Menopause may have certain physical symptoms and risks.  Hormone replacement therapy may reduce some of these symptoms and risks. Talk to your health care provider about whether hormone replacement therapy is right for you. Follow these instructions at home:  Schedule regular health, dental, and eye exams.  Stay current with your immunizations.  Do not use any tobacco products including cigarettes, chewing tobacco, or electronic cigarettes.  If you are pregnant, do not drink alcohol.  If you are breastfeeding, limit how much and how often you drink alcohol.  Limit alcohol intake to no more than 1 drink per day for nonpregnant women. One drink equals 12 ounces of beer, 5 ounces of wine, or 1 ounces of hard liquor.  Do not use street drugs.  Do not share needles.  Ask your health care provider for help if you need support  or information about quitting drugs.  Tell your health care provider if you often feel depressed.  Tell your health care provider if you have ever been abused or do not feel safe at home. This information is not intended to replace advice given to you by your health care provider. Make sure you discuss any questions you have with your health care provider. Document Released: 10/19/2010 Document Revised: 09/11/2015 Document Reviewed: 01/07/2015 Elsevier Interactive Patient Education  2017 [employer] Carbohydrate Counting for Diabetes Mellitus, Adult Carbohydrate counting is a method for keeping track of how many carbohydrates you eat. Eating carbohydrates naturally increases the amount of sugar (glucose) in the blood. Counting how many carbohydrates you eat helps keep your blood glucose within normal limits, which helps you manage your diabetes (diabetes mellitus). It is important to know how many carbohydrates you can safely have in each meal. This is different for every person. A diet and nutrition specialist (registered dietitian) can help you make a meal plan and calculate how many carbohydrates you should have at each meal and snack. Carbohydrates are found in the following foods:  Grains, such as breads and cereals.  Dried beans and soy products.  Starchy vegetables, such as potatoes, peas, and corn.  Fruit and fruit juices.  Milk and yogurt.  Sweets and snack foods, such as cake, cookies, candy, chips, and soft drinks. How do I count carbohydrates? There are two ways to count carbohydrates in food. You can use either of the methods or a combination of both. Reading "Nutrition Facts" on packaged food  The "Nutrition Facts" list is included on the labels of almost all packaged foods and beverages in the [place] It includes:  The serving size.  Information about nutrients in each serving, including the grams (g) of carbohydrate per serving. To use the "Nutrition Facts":  Decide  how many servings you will have.  Multiply the number of servings by the number of carbohydrates per serving.  The resulting number is the total amount of   carbohydrates that you will be having. Learning standard serving sizes of other foods  When you eat foods containing carbohydrates that are not packaged or do not include "Nutrition Facts" on the label, you need to measure the servings in order to count the amount of carbohydrates:  Measure the foods that you will eat with a food scale or measuring cup, if needed.  Decide how many standard-size servings you will eat.  Multiply the number of servings by 15. Most carbohydrate-rich foods have about 15 g of carbohydrates per serving.  For example, if you eat 8 oz (170 g) of strawberries, you will have eaten 2 servings and 30 g of carbohydrates (2 servings x 15 g = 30 g).  For foods that have more than one food mixed, such as soups and casseroles, you must count the carbohydrates in each food that is included. The following list contains standard serving sizes of common carbohydrate-rich foods. Each of these servings has about 15 g of carbohydrates:   hamburger bun or  English muffin.   oz (15 mL) syrup.   oz (14 g) jelly.  1 slice of bread.  1 six-inch tortilla.  3 oz (85 g) cooked rice or pasta.  4 oz (113 g) cooked dried beans.  4 oz (113 g) starchy vegetable, such as peas, corn, or potatoes.  4 oz (113 g) hot cereal.  4 oz (113 g) mashed potatoes or  of a large baked potato.  4 oz (113 g) canned or frozen fruit.  4 oz (120 mL) fruit juice.  4-6 crackers.  6 chicken nuggets.  6 oz (170 g) unsweetened dry cereal.  6 oz (170 g) plain fat-free yogurt or yogurt sweetened with artificial sweeteners.  8 oz (240 mL) milk.  8 oz (170 g) fresh fruit or one small piece of fruit.  24 oz (680 g) popped popcorn. Example of carbohydrate counting Sample meal   3 oz (85 g) chicken breast.  6 oz (170 g) brown  rice.  4 oz (113 g) corn.  8 oz (240 mL) milk.  8 oz (170 g) strawberries with sugar-free whipped topping. Carbohydrate calculation  1. Identify the foods that contain carbohydrates:  Rice.  Corn.  Milk.  Strawberries. 2. Calculate how many servings you have of each food:  2 servings rice.  1 serving corn.  1 serving milk.  1 serving strawberries. 3. Multiply each number of servings by 15 g:  2 servings rice x 15 g = 30 g.  1 serving corn x 15 g = 15 g.  1 serving milk x 15 g = 15 g.  1 serving strawberries x 15 g = 15 g. 4. Add together all of the amounts to find the total grams of carbohydrates eaten:  30 g + 15 g + 15 g + 15 g = 75 g of carbohydrates total. This information is not intended to replace advice given to you by your health care provider. Make sure you discuss any questions you have with your health care provider. Document Released: 04/05/2005 Document Revised: 10/24/2015 Document Reviewed: 09/17/2015 Elsevier Interactive Patient Education  2017 [employer] Inc.  

## 2016-07-20 NOTE — Progress Notes (Signed)
Cheryl Patterson 05/17/1970 [id number]    History:    Presents for annual exam.  02/2015 [facility] with bilateral salpingectomies for fibroids. Normal Pap and mammogram history. 2013 colon polyp, 04/2016 negative colonoscopy, mother colon cancer deceased.  Past medical history, past surgical history, family history and social history were all reviewed and documented in the EPIC chart. Works full time. Son and daughter both doing well.  ROS:  A ROS was performed and pertinent positives and negatives are included.  Exam:  Vitals:   07/20/16 0911  BP: 132/80  Weight: 207 lb (93.9 kg)  Height: 5\' 7"  (1.702 m)   Body mass index is 32.42 kg/m.   General appearance:  Normal Thyroid:  Symmetrical, normal in size, without palpable masses or nodularity. Respiratory  Auscultation:  Clear without wheezing or rhonchi Cardiovascular  Auscultation:  Regular rate, without rubs, murmurs or gallops  Edema/varicosities:  Not grossly evident Abdominal  Soft,nontender, without masses, guarding or rebound.  Liver/spleen:  No organomegaly noted  Hernia:  None appreciated  Skin  Inspection:  Grossly normal   Breasts: Examined lying and sitting.     Right: Without masses, retractions, discharge or axillary adenopathy.     Left: Without masses, retractions, discharge or axillary adenopathy. Gentitourinary   Inguinal/mons:  Normal without inguinal adenopathy  External genitalia:  Normal  BUS/Urethra/Skene's glands:  Normal  Vagina:  Normal  Cervix:  And uterus absent  Adnexa/parametria:     Rt: Without masses or tenderness.   Lt: Without masses or tenderness.  Anus and perineum: Normal  Digital rectal exam: Normal sphincter tone without palpated masses or tenderness  Assessment/Plan:  46 y.o. MBF G3 P2 for annual exam with no complaints.  02/2015 [facility] with bilateral salpingectomies for fibroids Obesity History of depression-on no medication  Plan: SBE's, continue annual screening mammogram,  calcium rich diet, vitamin D 2000 daily encouraged. Reviewed importance of increasing exercising and decreasing calories for weight loss. Weight Watchers encouraged. Reviewed importance of self-care, leisure activities, denies need for counseling at this time. CBC, CMP, lipid panel,    Huel Cote WHNP, 1:31 PM 07/20/2016

## 2016-07-21 ENCOUNTER — Other Ambulatory Visit: Payer: Self-pay | Admitting: *Deleted

## 2016-07-21 DIAGNOSIS — E7800 Pure hypercholesterolemia, unspecified: Secondary | ICD-10-CM

## 2016-08-01 ENCOUNTER — Encounter: Payer: Self-pay | Admitting: Family Medicine

## 2016-09-01 ENCOUNTER — Encounter: Payer: Self-pay | Admitting: Gynecology

## 2016-11-02 ENCOUNTER — Encounter: Payer: Self-pay | Admitting: Family Medicine

## 2016-11-02 ENCOUNTER — Ambulatory Visit: Payer: BLUE CROSS/BLUE SHIELD | Admitting: Family Medicine

## 2016-11-02 ENCOUNTER — Ambulatory Visit (INDEPENDENT_AMBULATORY_CARE_PROVIDER_SITE_OTHER): Payer: BLUE CROSS/BLUE SHIELD | Admitting: Family Medicine

## 2016-11-02 VITALS — BP 115/57 | HR 91 | Temp 99.0°F | Resp 16 | Ht 67.0 in | Wt 205.6 lb

## 2016-11-02 DIAGNOSIS — R300 Dysuria: Secondary | ICD-10-CM

## 2016-11-02 DIAGNOSIS — N23 Unspecified renal colic: Secondary | ICD-10-CM | POA: Diagnosis not present

## 2016-11-02 LAB — POC URINALSYSI DIPSTICK (AUTOMATED)
BILIRUBIN UA: NEGATIVE
Glucose, UA: NEGATIVE
KETONES UA: NEGATIVE
Nitrite, UA: NEGATIVE
Protein, UA: 30
SPEC GRAV UA: 1.01 (ref 1.010–1.025)
UROBILINOGEN UA: 0.2 U/dL
pH, UA: 6 (ref 5.0–8.0)

## 2016-11-02 MED ORDER — TRAMADOL HCL 50 MG PO TABS: 50.0000 mg | ORAL_TABLET | Freq: Four times a day (QID) | ORAL | 0 refills | Status: DC | PRN

## 2016-11-02 MED ORDER — CIPROFLOXACIN HCL 500 MG PO TABS: 500.0000 mg | ORAL_TABLET | Freq: Two times a day (BID) | ORAL | 0 refills | Status: DC

## 2016-11-02 MED ORDER — PHENAZOPYRIDINE HCL 100 MG PO TABS: 100.0000 mg | ORAL_TABLET | Freq: Three times a day (TID) | ORAL | 0 refills | Status: DC | PRN

## 2016-11-02 NOTE — Patient Instructions (Signed)
Renal Colic Renal colic is pain that is caused by passing a kidney stone. The pain can be sharp and severe. It may be felt in the back, abdomen, side (flank), or groin. It can cause nausea. Renal colic can come and go. Follow these instructions at home: Watch your condition for any changes. The following actions may help to lessen any discomfort that you are feeling:  Take medicines only as directed by your health care provider.  Ask your health care provider if it is okay to take over-the-counter pain medicine.  Drink enough fluid to keep your urine clear or pale yellow. Drink 6-8 glasses of water each day.  Limit the amount of salt that you eat to less than 2 grams per day.  Reduce the amount of protein in your diet. Eat less meat, fish, nuts, and dairy.  Avoid foods such as spinach, rhubarb, nuts, or bran. These may make kidney stones more likely to form.  Contact a health care provider if:  You have a fever or chills.  Your urine smells bad or looks cloudy.  You have pain or burning when you pass urine. Get help right away if:  Your flank pain or groin pain suddenly worsens.  You become confused or disoriented or you lose consciousness. This information is not intended to replace advice given to you by your health care provider. Make sure you discuss any questions you have with your health care provider. Document Released: 01/13/2005 Document Revised: 09/09/2015 Document Reviewed: 02/13/2014 Elsevier Interactive Patient Education  2018 [employer]  

## 2016-11-02 NOTE — Progress Notes (Signed)
Patient ID: Cheryl Patterson, female   DOB: Dec 29, 1970, 46 y.o.   MRN: [id number]     Subjective:  I acted as a Education administrator for Dr. Carollee Herter.  Guerry Bruin, [place]   Patient ID: Cheryl Patterson, female    DOB: April 01, 1971, 46 y.o.   MRN: [id number]  Chief Complaint  Patient presents with  . Dysuria    Dysuria   This is a new problem. Episode onset: 4 days ago. The problem has been rapidly worsening. The quality of the pain is described as aching and burning. Associated symptoms include flank pain, frequency, hematuria and urgency. Pertinent negatives include no chills, discharge, hesitancy, nausea, possible pregnancy, sweats or vomiting.    Patient is in today for dysuria   Patient Care Team: Carollee Herter, Alferd Apa, DO as PCP - General Terrance Mass, MD as Consulting Physician (Gynecology) Huel Cote, NP as Nurse Practitioner (Obstetrics and Gynecology)   Past Medical History:  Diagnosis Date  . Allergy   . Asthma    per pt- bronchitis brings on the symptoms  . Bipolar depression ([place])   . Chronic headaches   . Chronic kidney disease    kidney stones  . Depression    [facility]---suicidal ideation  . GERD (gastroesophageal reflux disease)   . Hyperlipidemia    no meds taken as of 04-20-16  . Kidney infection   . Ruptured disc, thoracic   . Sepsis ([place])    2014-  d/t urinary infection  . UTI (urinary tract infection)     Past Surgical History:  Procedure Laterality Date  . BREAST SURGERY     left breast lump removed  . COLONOSCOPY    . CYSTOSCOPY N/A 03/18/2015   Procedure: CYSTOSCOPY;  Surgeon: Terrance Mass, MD;  Location: [facility];  Service: Gynecology;  Laterality: N/A;  . LAPAROSCOPIC BILATERAL SALPINGECTOMY Bilateral 03/18/2015   Procedure: LAPAROSCOPIC BILATERAL SALPINGECTOMY;  Surgeon: Terrance Mass, MD;  Location: [facility];  Service: Gynecology;  Laterality: Bilateral;  . LAPAROSCOPIC VAGINAL HYSTERECTOMY WITH SALPINGO OOPHORECTOMY N/A 03/18/2015   Procedure: LAPAROSCOPIC ASSISTED VAGINAL HYSTERECTOMY WITH SALPINGO OOPHORECTOMY;  Surgeon: Terrance Mass, MD;  Location: [facility];  Service: Gynecology;  Laterality: N/A;  . LEEP  2005  . LIPOMA EXCISION    . MOUTH SURGERY    . OTHER SURGICAL HISTORY     RENAL STONE REMOVAL  . TUBAL LIGATION      Family History  Problem Relation Age of Onset  . Colon cancer Maternal Aunt   . Cancer Maternal Aunt        colon  . Breast cancer Mother   . Colon cancer Mother 74  . Cancer Mother 88       colon  . Hypertension Unknown   . Stroke Paternal Grandmother   . Hypertension Paternal Grandmother   . Heart disease Paternal Grandmother   . Breast cancer Paternal Aunt   . Cancer Paternal Aunt        breast  . Uterine cancer Maternal Aunt   . Brain cancer Maternal Aunt   . Diabetes Maternal Grandfather   . Cancer Maternal Uncle        COLON  . Colon cancer Paternal Uncle   . Esophageal cancer Neg Hx   . Rectal cancer Neg Hx   . Stomach cancer Neg Hx     Social History   Social History  . Marital status: Married    Spouse name: N/A  . Number of  children: 2  . Years of education: N/A   Occupational History  . levelor [employer]   Social History Main Topics  . Smoking status: Never Smoker  . Smokeless tobacco: Never Used  . Alcohol use No  . Drug use: No  . Sexual activity: Yes    Partners: Male    Birth control/ protection: None   Other Topics Concern  . Not on file   Social History Narrative   Exercise--walk qd                    Video--  rare    Outpatient Medications Prior to Visit  Medication Sig Dispense Refill  . albuterol (PROVENTIL HFA;VENTOLIN HFA) 108 (90 BASE) MCG/ACT inhaler Inhale 2 puffs into the lungs every 6 (six) hours as needed for wheezing or shortness of breath. 1 Inhaler 2  . amphetamine-dextroamphetamine (ADDERALL) 30 MG tablet Take 1 tablet by mouth daily. 30 tablet 0  . calcium carbonate (TUMS EX) 750 MG chewable tablet Chew 1 tablet by  mouth daily. Reported on 04/03/2015    . diclofenac (VOLTAREN) 75 MG EC tablet Take 75 mg by mouth daily as needed (For back pain.). Reported on 04/03/2015    . diphenhydrAMINE (BENADRYL) 25 MG tablet Take 25 mg by mouth daily as needed for allergies or sleep. Reported on 05/02/2015    . ibuprofen (ADVIL,MOTRIN) 200 MG tablet Take 800 mg by mouth every 6 (six) hours as needed for moderate pain. Reported on 04/03/2015    . Multiple Vitamins-Minerals (HAIR SKIN AND NAILS FORMULA PO) Take by mouth daily.    . Na Sulfate-K Sulfate-Mg Sulf (SUPREP BOWEL PREP KIT) 17.5-3.13-1.6 GM/180ML SOLN Suprep as directed, no substitutions (Patient not taking: Reported on 07/20/2016) 354 mL 0  . solifenacin (VESICARE) 10 MG tablet Take 1 tablet (10 mg total) by mouth daily. (Patient not taking: Reported on 04/27/2016) 30 tablet 2   Facility-Administered Medications Prior to Visit  Medication Dose Route Frequency Provider Last Rate Last Dose  . 0.9 %  sodium chloride infusion  500 mL Intravenous Continuous Milus Banister, MD        Allergies  Allergen Reactions  . Codeine     Itchy throat, whelps on skin  . Other Swelling and Rash    tomatoes    Review of Systems  Constitutional: Negative for chills and fever.  HENT: Negative for congestion.   Eyes: Negative for blurred vision.  Respiratory: Negative for cough.   Cardiovascular: Negative for chest pain and palpitations.  Gastrointestinal: Negative for nausea and vomiting.  Genitourinary: Positive for dysuria, flank pain, frequency, hematuria and urgency. Negative for hesitancy.  Musculoskeletal: Negative for back pain.  Skin: Negative for rash.  Neurological: Negative for loss of consciousness and headaches.       Objective:    Physical Exam  Constitutional: She is oriented to person, place, and time. She appears well-developed and well-nourished.  HENT:  Head: Normocephalic and atraumatic.  Eyes: Conjunctivae and EOM are normal.  Neck: Normal  range of motion. Neck supple. No JVD present. Carotid bruit is not present. No thyromegaly present.  Cardiovascular: Normal rate, regular rhythm and normal heart sounds.   No murmur heard. Pulmonary/Chest: Effort normal and breath sounds normal. No respiratory distress. She has no wheezes. She has no rales. She exhibits no tenderness.  Abdominal: Soft. There is no hepatosplenomegaly, splenomegaly or hepatomegaly. There is tenderness in the suprapubic area. There is CVA tenderness. There is no rigidity, no rebound,  no guarding, no tenderness at McBurney's point and negative Murphy's sign.  Musculoskeletal: She exhibits no edema.  Neurological: She is alert and oriented to person, place, and time.  Psychiatric: She has a normal mood and affect.  Nursing note and vitals reviewed.   BP (!) 115/57 (BP Location: Left Arm, Cuff Size: Normal)   Pulse 91   Temp 99 F (37.2 C) (Oral)   Resp 16   Ht 5' 7" (1.702 m)   Wt 205 lb 9.6 oz (93.3 kg)   LMP 12/02/2014 Comment: continous bleeding  SpO2 98%   BMI 32.20 kg/m  Wt Readings from Last 3 Encounters:  11/02/16 205 lb 9.6 oz (93.3 kg)  07/20/16 207 lb (93.9 kg)  04/27/16 213 lb (96.6 kg)   BP Readings from Last 3 Encounters:  11/02/16 (!) 115/57  07/20/16 132/80  04/27/16 (!) 134/52     Immunization History  Administered Date(s) Administered  . Td 08/07/2004    Health Maintenance  Topic Date Due  . TETANUS/TDAP  08/08/2014  . MAMMOGRAM  06/11/2016  . PAP SMEAR  09/26/2016  . INFLUENZA VACCINE  04/02/2017 (Originally 11/17/2016)  . COLONOSCOPY  04/27/2021  . HIV Screening  Completed    Lab Results  Component Value Date   WBC 5.1 07/20/2016   HGB 13.2 07/20/2016   HCT 40.3 07/20/2016   PLT 313 07/20/2016   GLUCOSE 92 07/20/2016   CHOL 219 (H) 07/20/2016   TRIG 67 07/20/2016   HDL 49 (L) 07/20/2016   LDLDIRECT 144.3 06/25/2010   LDLCALC 157 (H) 07/20/2016   ALT 10 07/20/2016   AST 14 07/20/2016   NA 138 07/20/2016   K 4.2  07/20/2016   CL 102 07/20/2016   CREATININE 0.84 07/20/2016   BUN 7 07/20/2016   CO2 24 07/20/2016   TSH 1.13 01/10/2015    Lab Results  Component Value Date   TSH 1.13 01/10/2015   Lab Results  Component Value Date   WBC 5.1 07/20/2016   HGB 13.2 07/20/2016   HCT 40.3 07/20/2016   MCV 86.5 07/20/2016   PLT 313 07/20/2016   Lab Results  Component Value Date   NA 138 07/20/2016   K 4.2 07/20/2016   CO2 24 07/20/2016   GLUCOSE 92 07/20/2016   BUN 7 07/20/2016   CREATININE 0.84 07/20/2016   BILITOT 0.3 07/20/2016   ALKPHOS 84 07/20/2016   AST 14 07/20/2016   ALT 10 07/20/2016   PROT 7.1 07/20/2016   ALBUMIN 3.9 07/20/2016   CALCIUM 9.2 07/20/2016   GFR 88.32 01/10/2015   Lab Results  Component Value Date   CHOL 219 (H) 07/20/2016   Lab Results  Component Value Date   HDL 49 (L) 07/20/2016   Lab Results  Component Value Date   LDLCALC 157 (H) 07/20/2016   Lab Results  Component Value Date   TRIG 67 07/20/2016   Lab Results  Component Value Date   CHOLHDL 4.5 07/20/2016   No results found for: HGBA1C       Assessment & Plan:   Problem List Items Addressed This Visit    None    Visit Diagnoses    Dysuria    -  Primary   Relevant Medications   ciprofloxacin (CIPRO) 500 MG tablet   phenazopyridine (PYRIDIUM) 100 MG tablet   Other Relevant Orders   POCT Urinalysis Dipstick (Automated) (Completed)   Urine Culture   Renal colic on left side       Relevant Medications  traMADol (ULTRAM) 50 MG tablet    strain urine Culture pending If pain worsens rto or go to er   I have discontinued Ms. Lozito's solifenacin and Na Sulfate-K Sulfate-Mg Sulf. I am also having her start on traMADol, ciprofloxacin, and phenazopyridine. Additionally, I am having her maintain her albuterol, ibuprofen, diphenhydrAMINE, diclofenac, calcium carbonate, amphetamine-dextroamphetamine, Multiple Vitamins-Minerals (HAIR SKIN AND NAILS FORMULA PO), Vitamin D (Ergocalciferol),  and NAPROXEN DR. We will continue to administer sodium chloride.  Meds ordered this encounter  Medications  . Vitamin D, Ergocalciferol, (DRISDOL) 50000 units CAPS capsule  . NAPROXEN DR 500 MG EC tablet  . traMADol (ULTRAM) 50 MG tablet    Sig: Take 1 tablet (50 mg total) by mouth every 6 (six) hours as needed.    Dispense:  20 tablet    Refill:  0  . ciprofloxacin (CIPRO) 500 MG tablet    Sig: Take 1 tablet (500 mg total) by mouth 2 (two) times daily.    Dispense:  10 tablet    Refill:  0  . phenazopyridine (PYRIDIUM) 100 MG tablet    Sig: Take 1 tablet (100 mg total) by mouth 3 (three) times daily as needed for pain.    Dispense:  6 tablet    Refill:  0    CMA served as scribe during this visit. History, Physical and Plan performed by medical provider. Documentation and orders reviewed and attested to.  Ann Held, DO

## 2016-11-04 LAB — URINE CULTURE

## 2016-11-11 ENCOUNTER — Encounter (INDEPENDENT_AMBULATORY_CARE_PROVIDER_SITE_OTHER): Payer: BLUE CROSS/BLUE SHIELD | Admitting: Family Medicine

## 2016-11-23 ENCOUNTER — Ambulatory Visit (INDEPENDENT_AMBULATORY_CARE_PROVIDER_SITE_OTHER): Payer: BLUE CROSS/BLUE SHIELD | Admitting: Family Medicine

## 2016-11-23 ENCOUNTER — Encounter (INDEPENDENT_AMBULATORY_CARE_PROVIDER_SITE_OTHER): Payer: Self-pay | Admitting: Family Medicine

## 2016-11-23 VITALS — BP 104/69 | HR 65 | Temp 98.9°F | Ht 66.0 in | Wt 203.0 lb

## 2016-11-23 DIAGNOSIS — Z1389 Encounter for screening for other disorder: Secondary | ICD-10-CM | POA: Diagnosis not present

## 2016-11-23 DIAGNOSIS — E669 Obesity, unspecified: Secondary | ICD-10-CM | POA: Diagnosis not present

## 2016-11-23 DIAGNOSIS — Z6832 Body mass index (BMI) 32.0-32.9, adult: Secondary | ICD-10-CM

## 2016-11-23 DIAGNOSIS — R5383 Other fatigue: Secondary | ICD-10-CM | POA: Diagnosis not present

## 2016-11-23 DIAGNOSIS — Z0289 Encounter for other administrative examinations: Secondary | ICD-10-CM

## 2016-11-23 DIAGNOSIS — R0602 Shortness of breath: Secondary | ICD-10-CM | POA: Diagnosis not present

## 2016-11-23 DIAGNOSIS — Z1331 Encounter for screening for depression: Secondary | ICD-10-CM

## 2016-11-24 LAB — CBC WITH DIFFERENTIAL
BASOS ABS: 0 10*3/uL (ref 0.0–0.2)
BASOS: 0 %
EOS (ABSOLUTE): 0.1 10*3/uL (ref 0.0–0.4)
EOS: 1 %
HEMATOCRIT: 38.8 % (ref 34.0–46.6)
Hemoglobin: 13 g/dL (ref 11.1–15.9)
IMMATURE GRANULOCYTES: 0 %
Immature Grans (Abs): 0 10*3/uL (ref 0.0–0.1)
LYMPHS ABS: 1.9 10*3/uL (ref 0.7–3.1)
LYMPHS: 37 %
MCH: 28.8 pg (ref 26.6–33.0)
MCHC: 33.5 g/dL (ref 31.5–35.7)
MCV: 86 fL (ref 79–97)
MONOCYTES: 5 %
MONOS ABS: 0.3 10*3/uL (ref 0.1–0.9)
NEUTROS ABS: 2.9 10*3/uL (ref 1.4–7.0)
Neutrophils: 57 %
RBC: 4.52 x10E6/uL (ref 3.77–5.28)
RDW: 13.8 % (ref 12.3–15.4)
WBC: 5.2 10*3/uL (ref 3.4–10.8)

## 2016-11-24 LAB — HEMOGLOBIN A1C
Est. average glucose Bld gHb Est-mCnc: 120 mg/dL
HEMOGLOBIN A1C: 5.8 % — AB (ref 4.8–5.6)

## 2016-11-24 LAB — COMPREHENSIVE METABOLIC PANEL
ALK PHOS: 96 IU/L (ref 39–117)
ALT: 12 IU/L (ref 0–32)
AST: 14 IU/L (ref 0–40)
Albumin/Globulin Ratio: 1.5 (ref 1.2–2.2)
Albumin: 4.4 g/dL (ref 3.5–5.5)
BILIRUBIN TOTAL: 0.4 mg/dL (ref 0.0–1.2)
BUN/Creatinine Ratio: 8 — ABNORMAL LOW (ref 9–23)
BUN: 7 mg/dL (ref 6–24)
CHLORIDE: 99 mmol/L (ref 96–106)
CO2: 24 mmol/L (ref 20–29)
CREATININE: 0.86 mg/dL (ref 0.57–1.00)
Calcium: 9.4 mg/dL (ref 8.7–10.2)
GFR calc Af Amer: 94 mL/min/{1.73_m2} (ref >59)
GFR calc non Af Amer: 81 mL/min/{1.73_m2} (ref >59)
GLOBULIN, TOTAL: 3 g/dL (ref 1.5–4.5)
GLUCOSE: 85 mg/dL (ref 65–99)
Potassium: 4.5 mmol/L (ref 3.5–5.2)
SODIUM: 138 mmol/L (ref 134–144)
Total Protein: 7.4 g/dL (ref 6.0–8.5)

## 2016-11-24 LAB — T3: T3 TOTAL: 133 ng/dL (ref 71–180)

## 2016-11-24 LAB — LIPID PANEL WITH LDL/HDL RATIO
Cholesterol, Total: 222 mg/dL — ABNORMAL HIGH (ref 100–199)
HDL: 44 mg/dL (ref >39)
LDL CALC: 163 mg/dL — AB (ref 0–99)
LDL/HDL RATIO: 3.7 ratio — AB (ref 0.0–3.2)
Triglycerides: 76 mg/dL (ref 0–149)
VLDL CHOLESTEROL CAL: 15 mg/dL (ref 5–40)

## 2016-11-24 LAB — T4, FREE: FREE T4: 1.09 ng/dL (ref 0.82–1.77)

## 2016-11-24 LAB — VITAMIN D 25 HYDROXY (VIT D DEFICIENCY, FRACTURES): VIT D 25 HYDROXY: 28.5 ng/mL — AB (ref 30.0–100.0)

## 2016-11-24 LAB — INSULIN, RANDOM: INSULIN: 9.3 u[IU]/mL (ref 2.6–24.9)

## 2016-11-24 LAB — TSH: TSH: 1.21 u[IU]/mL (ref 0.450–4.500)

## 2016-11-24 LAB — FOLATE: FOLATE: 12.3 ng/mL (ref >3.0)

## 2016-11-24 LAB — VITAMIN B12: Vitamin B-12: 1823 pg/mL — ABNORMAL HIGH (ref 232–1245)

## 2016-11-24 NOTE — Progress Notes (Signed)
Office: [phone]  /  Fax: [phone]   Dear Dr. Charlett Blake,   Thank you for referring Cheryl Patterson to our clinic. The following note includes my evaluation and treatment recommendations.  HPI:   Chief Complaint: Cheryl Patterson has been referred by Erline Levine A. Charlett Blake for consultation regarding her obesity and obesity related comorbidities.    Cheryl Patterson (MR# [id number]) is a 46 y.o. female who presents on 11/24/2016 for obesity evaluation and treatment. Current BMI is Body mass index is 32.77 kg/m.Cheryl Patterson has been struggling with her weight for many years and has been unsuccessful in either losing weight, maintaining weight loss, or reaching her healthy weight goal.     Cheryl Patterson attended our information session and states she is currently in the action stage of change and ready to dedicate time achieving and maintaining a healthier weight. [place] is interested in becoming our patient and working on intensive lifestyle modifications including (but not limited to) diet, exercise and weight loss.    Cheryl Patterson states her family eats meals together she thinks her family will eat healthier with  her she struggles with family and or coworkers weight loss sabotage her desired weight loss is 49 lbs she started gaining weight around 2015 her heaviest weight ever was 212 or 218 lbs. she has significant food cravings issues  she snacks frequently in the evenings she skips meals frequently she is frequently drinking liquids with calories she frequently makes poor food choices she has problems with excessive hunger  she frequently eats larger portions than normal  she has binge eating behaviors she struggles with emotional eating    Fatigue Cheryl Patterson feels her energy is lower than it should be. This has worsened with weight gain and has not worsened recently. Cheryl Patterson admits to daytime somnolence and  admits to waking up still tired. Patient is at risk for obstructive sleep  apnea. Cheryl Patterson has a history of symptoms of daytime fatigue, morning fatigue and morning headache. Patient generally gets 5 hours of sleep per night, and states they generally have restless sleep. Snoring is present. Apneic episodes are present. Epworth Sleepiness Score is 13  Dyspnea on exertion Cheryl Patterson notes increasing shortness of breath with exercising and seems to be worsening over time with weight gain. She notes getting out of breath sooner with activity than she used to. This has not gotten worse recently. Cheryl Patterson denies orthopnea.  Depression Screen Cheryl Patterson's Food and Mood (modified PHQ-9) score was  Depression screen PHQ 2/9 11/23/2016  Decreased Interest 3  Down, Depressed, Hopeless 1  PHQ - 2 Score 4  Altered sleeping 2  Tired, decreased energy 3  Change in appetite 3  Feeling bad or failure about yourself  2  Trouble concentrating 2  Moving slowly or fidgety/restless 0  Suicidal thoughts 0  PHQ-9 Score 16  Difficult doing work/chores -    ALLERGIES: Allergies  Allergen Reactions  . Codeine     Itchy throat, whelps on skin  . Other Swelling and Rash    tomatoes    MEDICATIONS: Current Outpatient Prescriptions on File Prior to Visit  Medication Sig Dispense Refill  . Multiple Vitamins-Minerals (HAIR SKIN AND NAILS FORMULA PO) Take by mouth daily.    . Vitamin D, Ergocalciferol, (DRISDOL) 50000 units CAPS capsule      Current Facility-Administered Medications on File Prior to Visit  Medication Dose Route Frequency Provider Last Rate Last Dose  . 0.9 %  sodium chloride infusion  500 mL Intravenous  Continuous Milus Banister, MD        PAST MEDICAL HISTORY: Past Medical History:  Diagnosis Date  . ADD (attention deficit disorder)   . Allergy   . Anxiety   . Asthma    per pt- bronchitis brings on the symptoms  . Bipolar depression ([place])   . Chronic headaches   . Chronic kidney disease    kidney stones  . Constipation   . Depression    [facility]---suicidal ideation  . GERD (gastroesophageal reflux disease)   . Hyperlipidemia    no meds taken as of 04-20-16  . IBS (irritable bowel syndrome)   . Joint pain   . Kidney infection   . Lactose intolerance   . Ruptured disc, thoracic   . Sepsis ([place])    2014-  d/t urinary infection  . UTI (urinary tract infection)     PAST SURGICAL HISTORY: Past Surgical History:  Procedure Laterality Date  . BREAST SURGERY     left breast lump removed  . COLONOSCOPY    . CYSTOSCOPY N/A 03/18/2015   Procedure: CYSTOSCOPY;  Surgeon: Terrance Mass, MD;  Location: [facility];  Service: Gynecology;  Laterality: N/A;  . LAPAROSCOPIC BILATERAL SALPINGECTOMY Bilateral 03/18/2015   Procedure: LAPAROSCOPIC BILATERAL SALPINGECTOMY;  Surgeon: Terrance Mass, MD;  Location: [facility];  Service: Gynecology;  Laterality: Bilateral;  . LAPAROSCOPIC VAGINAL HYSTERECTOMY WITH SALPINGO OOPHORECTOMY N/A 03/18/2015   Procedure: LAPAROSCOPIC ASSISTED VAGINAL HYSTERECTOMY WITH SALPINGO OOPHORECTOMY;  Surgeon: Terrance Mass, MD;  Location: [facility];  Service: Gynecology;  Laterality: N/A;  . LEEP  2005  . LIPOMA EXCISION    . MOUTH SURGERY    . OTHER SURGICAL HISTORY     RENAL STONE REMOVAL  . TUBAL LIGATION      SOCIAL HISTORY: Social History  Substance Use Topics  . Smoking status: Never Smoker  . Smokeless tobacco: Never Used  . Alcohol use No    FAMILY HISTORY: Family History  Problem Relation Age of Onset  . Colon cancer Maternal Aunt   . Cancer Maternal Aunt        colon  . Breast cancer Mother   . Colon cancer Mother 51  . Cancer Mother 60       colon  . Anxiety disorder Mother   . Hypertension Father   . Hyperlipidemia Father   . Hypertension Unknown   . Stroke Paternal Grandmother   . Hypertension Paternal Grandmother   . Heart disease Paternal Grandmother   . Breast cancer Paternal Aunt   . Cancer Paternal Aunt        breast  . Uterine cancer Maternal Aunt   . Brain cancer  Maternal Aunt   . Diabetes Maternal Grandfather   . Cancer Maternal Uncle        COLON  . Colon cancer Paternal Uncle   . Esophageal cancer Neg Hx   . Rectal cancer Neg Hx   . Stomach cancer Neg Hx     ROS: Review of Systems  Constitutional: Positive for malaise/fatigue.       Breasts Lumps  HENT: Positive for tinnitus.   Eyes:       Wear Glasses or Contacts  Respiratory: Positive for shortness of breath (with activity).   Cardiovascular: Negative for orthopnea.       Calf/Leg Pain with Walking Leg Cramping  Gastrointestinal: Positive for constipation, heartburn and nausea.  Genitourinary: Positive for frequency.  Musculoskeletal: Positive for back pain.  Muscle Stiffness  Skin: Positive for itching.       Hair or Nail Changes  Endo/Heme/Allergies:       Polyphagia  Psychiatric/Behavioral: The patient has insomnia.        Stress    PHYSICAL EXAM: Blood pressure 104/69, pulse 65, temperature 98.9 F (37.2 C), temperature source Oral, height 5\' 6"  (1.676 m), weight 203 lb (92.1 kg), last menstrual period 12/02/2014, SpO2 99 %. Body mass index is 32.77 kg/m. Physical Exam  Constitutional: She is oriented to person, place, and time. She appears well-developed and well-nourished.  Cardiovascular: Normal rate.   Pulmonary/Chest: Effort normal.  Musculoskeletal: Normal range of motion.  Neurological: She is oriented to person, place, and time.  Skin: Skin is warm and dry.  Psychiatric: She has a normal mood and affect. Her behavior is normal.  Vitals reviewed.   RECENT LABS AND TESTS: BMET    Component Value Date/Time   NA 138 07/20/2016 0941   K 4.2 07/20/2016 0941   CL 102 07/20/2016 0941   CO2 24 07/20/2016 0941   GLUCOSE 92 07/20/2016 0941   BUN 7 07/20/2016 0941   CREATININE 0.84 07/20/2016 0941   CALCIUM 9.2 07/20/2016 0941   GFRNONAA 79 (L) 05/22/2012 0501   GFRAA >90 05/22/2012 0501   No results found for: HGBA1C No results found for:  INSULIN CBC    Component Value Date/Time   WBC 5.1 07/20/2016 0941   RBC 4.66 07/20/2016 0941   HGB 13.2 07/20/2016 0941   HCT 40.3 07/20/2016 0941   PLT 313 07/20/2016 0941   MCV 86.5 07/20/2016 0941   MCH 28.3 07/20/2016 0941   MCHC 32.8 07/20/2016 0941   RDW 13.6 07/20/2016 0941   LYMPHSABS 1,581 07/20/2016 0941   MONOABS 255 07/20/2016 0941   EOSABS 51 07/20/2016 0941   BASOSABS 0 07/20/2016 0941   Iron/TIBC/Ferritin/ %Sat    Component Value Date/Time   IRON 104 05/31/2008 0911   FERRITIN 33.9 05/31/2008 0911   IRONPCTSAT 27.3 05/31/2008 0911   Lipid Panel     Component Value Date/Time   CHOL 219 (H) 07/20/2016 0941   TRIG 67 07/20/2016 0941   HDL 49 (L) 07/20/2016 0941   CHOLHDL 4.5 07/20/2016 0941   VLDL 13 07/20/2016 0941   LDLCALC 157 (H) 07/20/2016 0941   LDLDIRECT 144.3 06/25/2010 0958   Hepatic Function Panel     Component Value Date/Time   PROT 7.1 07/20/2016 0941   ALBUMIN 3.9 07/20/2016 0941   AST 14 07/20/2016 0941   ALT 10 07/20/2016 0941   ALKPHOS 84 07/20/2016 0941   BILITOT 0.3 07/20/2016 0941   BILIDIR 0.1 08/23/2014 1020      Component Value Date/Time   TSH 1.13 01/10/2015 1007   TSH 0.96 01/03/2014 1408   TSH 0.73 08/27/2011 0936    ECG  shows NSR with a rate of 73 BPM INDIRECT CALORIMETER done today shows a VO2 of 176 and a REE of 1227.  Her calculated basal metabolic rate is 8299 thus her basal metabolic rate is worse than expected.    ASSESSMENT AND PLAN: Other fatigue - Plan: EKG 12-Lead, Vitamin B12, CBC With Differential, Comprehensive metabolic panel, Folate, Hemoglobin A1c, Insulin, random, Lipid Panel With LDL/HDL Ratio, T3, T4, free, TSH, VITAMIN D 25 Hydroxy (Vit-D Deficiency, Fractures)  Shortness of breath on exertion  Depression screening  Class 1 obesity with serious comorbidity and body mass index (BMI) of 32.0 to 32.9 in adult, unspecified obesity type  PLAN: Fatigue  Cheryl Patterson was informed that her fatigue may  be related to obesity, depression or many other causes. Labs will be ordered, and in the meanwhile Cheryl Patterson has agreed to work on diet, exercise and weight loss to help with fatigue. Proper sleep hygiene was discussed including the need for 7-8 hours of quality sleep each night. A sleep study was not ordered based on symptoms and Epworth score.  Dyspnea on exertion Cheryl Patterson's shortness of breath appears to be obesity related and exercise induced. She has agreed to work on weight loss and gradually increase exercise to treat her exercise induced shortness of breath. If Cheryl Patterson follows our instructions and loses weight without improvement of her shortness of breath, we will plan to refer to pulmonology. We will monitor this condition regularly. Cheryl Patterson agrees to this plan.  Depression Screen Cheryl Patterson had a strongly positive depression screening. Depression is commonly associated with obesity and often results in emotional eating behaviors. We will monitor this closely and work on CBT to help improve the non-hunger eating patterns. Referral to Psychology may be required if no improvement is seen as she continues in our clinic.  Obesity Cheryl Patterson is currently in the action stage of change and her goal is to continue with weight loss efforts. I recommend Cheryl Patterson begin the structured treatment plan as follows:  She has agreed to follow the Category 2 plan Cheryl Patterson has been instructed to eventually work up to a goal of 150 minutes of combined cardio and strengthening exercise per week for weight loss and overall health benefits. We discussed the following Behavioral Modification Strategies today: meal planning & cooking strategies, increasing lean protein intake, decreasing simple carbohydrates  and dealing with family or coworker sabotage   She was informed of the importance of frequent follow up visits to maximize her success with intensive lifestyle modifications for her multiple health  conditions. She was informed we would discuss her lab results at her next visit unless there is a critical issue that needs to be addressed sooner. Cheryl Patterson agreed to keep her next visit at the agreed upon time to discuss these results.  I, Cheryl Patterson, am acting as transcriptionist for Dennard Nip, MD  I have reviewed the above documentation for accuracy and completeness, and I agree with the above. -Dennard Nip, MD   OBESITY BEHAVIORAL INTERVENTION VISIT  Today's visit was # 1 out of 86.  Starting weight: 203 lbs Starting date: 11/23/16 Today's weight : 203 lbs  Today's date: 11/23/2016 Total lbs lost to date: 0 (Patients must lose 7 lbs in the first 6 months to continue with counseling)   ASK: We discussed the diagnosis of obesity with Cheryl Obey Lamboy today and Nhung agreed to give [place] permission to discuss obesity behavioral modification therapy today.  ASSESS: Avionna has the diagnosis of obesity and her BMI today is 32.8 Leeanna is in the action stage of change   ADVISE: Cass was educated on the multiple health risks of obesity as well as the benefit of weight loss to improve her health. She was advised of the need for long term treatment and the importance of lifestyle modifications.  AGREE: Multiple dietary modification options and treatment options were discussed and  Andrey agreed to follow the Category 2 plan We discussed the following Behavioral Modification Strategies today: meal planning & cooking strategies, increasing lean protein intake, decreasing simple carbohydrates  and dealing with family or coworker sabotage

## 2016-12-13 ENCOUNTER — Ambulatory Visit (INDEPENDENT_AMBULATORY_CARE_PROVIDER_SITE_OTHER): Payer: BLUE CROSS/BLUE SHIELD | Admitting: Family Medicine

## 2016-12-13 VITALS — BP 108/69 | HR 80 | Temp 98.8°F | Ht 66.0 in | Wt 205.0 lb

## 2016-12-13 DIAGNOSIS — E669 Obesity, unspecified: Secondary | ICD-10-CM | POA: Insufficient documentation

## 2016-12-13 DIAGNOSIS — Z6833 Body mass index (BMI) 33.0-33.9, adult: Secondary | ICD-10-CM | POA: Insufficient documentation

## 2016-12-13 DIAGNOSIS — E559 Vitamin D deficiency, unspecified: Secondary | ICD-10-CM | POA: Diagnosis not present

## 2016-12-13 DIAGNOSIS — R7303 Prediabetes: Secondary | ICD-10-CM | POA: Insufficient documentation

## 2016-12-13 MED ORDER — METFORMIN HCL 500 MG PO TABS: 500.0000 mg | ORAL_TABLET | Freq: Every morning | ORAL | 0 refills | Status: DC

## 2016-12-13 NOTE — Progress Notes (Signed)
Office: [phone]  /  Fax: [phone]   HPI:   Chief Complaint: OBESITY Cheryl Patterson is here to discuss her progress with her obesity treatment plan. She is on the Category 2 plan and is following her eating plan approximately 30 % of the time. She states she is walking stairs for 30 minutes 3 to 4 times per week. Cheryl Patterson was unable to recently start plan as her husband had emergency surgery to amputate foot. She recognizes she did more emotional eating and planning meals didn't happen. She is ready to start the category 2 plan again. Her weight is 205 lb (93 kg) today and has had a weight gain of 2 pounds over a period of 3 weeks since her last visit. She has gained 2 lbs since starting treatment with [place].  Vitamin D deficiency Cheryl Patterson has a diagnosis of vitamin D deficiency. She had been on vit D but is not taking any longer and she is not at goal. Cheryl Patterson denies nausea, vomiting or muscle weakness.  Pre-Diabetes Cheryl Patterson has a diagnosis of pre-diabetes based on her elevated Hgb A1c at 5.8 and was informed this puts her at greater risk of developing diabetes. She is familiar with diabetes due to her husband being an uncontrolled diabetic who has had multiple amputations as a result. She is not taking metformin currently and continues to work on diet and exercise to decrease risk of diabetes. She admits polyphagia and denies nausea or hypoglycemia.    ALLERGIES: Allergies  Allergen Reactions   Codeine     Itchy throat, whelps on skin   Other Swelling and Rash    tomatoes    MEDICATIONS: Current Outpatient Prescriptions on File Prior to Visit  Medication Sig Dispense Refill   Multiple Vitamins-Minerals (HAIR SKIN AND NAILS FORMULA PO) Take by mouth daily.     vitamin B-12 (CYANOCOBALAMIN) 100 MCG tablet Take 100 mcg by mouth daily.     Current Facility-Administered Medications on File Prior to Visit  Medication Dose Route Frequency Provider Last Rate Last Dose   0.9 %   sodium chloride infusion  500 mL Intravenous Continuous Milus Banister, Cheryl Patterson        PAST MEDICAL HISTORY: Past Medical History:  Diagnosis Date   ADD (attention deficit disorder)    Allergy    Anxiety    Asthma    per pt- bronchitis brings on the symptoms   Bipolar depression ([place])    Chronic headaches    Chronic kidney disease    kidney stones   Constipation    Depression    [facility]---suicidal ideation   GERD (gastroesophageal reflux disease)    Hyperlipidemia    no meds taken as of 04-20-16   IBS (irritable bowel syndrome)    Joint pain    Kidney infection    Lactose intolerance    Ruptured disc, thoracic    Sepsis ([place])    2014-  d/t urinary infection   UTI (urinary tract infection)     PAST SURGICAL HISTORY: Past Surgical History:  Procedure Laterality Date   BREAST SURGERY     left breast lump removed   COLONOSCOPY     CYSTOSCOPY N/A 03/18/2015   Procedure: CYSTOSCOPY;  Surgeon: Terrance Mass, Cheryl Patterson;  Location: [facility];  Service: Gynecology;  Laterality: N/A;   LAPAROSCOPIC BILATERAL SALPINGECTOMY Bilateral 03/18/2015   Procedure: LAPAROSCOPIC BILATERAL SALPINGECTOMY;  Surgeon: Terrance Mass, Cheryl Patterson;  Location: [facility];  Service: Gynecology;  Laterality: Bilateral;   LAPAROSCOPIC  VAGINAL HYSTERECTOMY WITH SALPINGO OOPHORECTOMY N/A 03/18/2015   Procedure: LAPAROSCOPIC ASSISTED VAGINAL HYSTERECTOMY WITH SALPINGO OOPHORECTOMY;  Surgeon: Terrance Mass, Cheryl Patterson;  Location: [facility];  Service: Gynecology;  Laterality: N/A;   LEEP  2005   LIPOMA EXCISION     MOUTH SURGERY     OTHER SURGICAL HISTORY     RENAL STONE REMOVAL   TUBAL LIGATION      SOCIAL HISTORY: Social History  Substance Use Topics   Smoking status: Never Smoker   Smokeless tobacco: Never Used   Alcohol use No    FAMILY HISTORY: Family History  Problem Relation Age of Onset   Colon cancer Maternal Aunt    Cancer Maternal Aunt        colon   Breast  cancer Mother    Colon cancer Mother 7   Cancer Mother 66       colon   Anxiety disorder Mother    Hypertension Father    Hyperlipidemia Father    Hypertension Unknown    Stroke Paternal Grandmother    Hypertension Paternal Grandmother    Heart disease Paternal Grandmother    Breast cancer Paternal Aunt    Cancer Paternal Aunt        breast   Uterine cancer Maternal Aunt    Brain cancer Maternal Aunt    Diabetes Maternal Grandfather    Cancer Maternal Uncle        COLON   Colon cancer Paternal Uncle    Esophageal cancer Neg Hx    Rectal cancer Neg Hx    Stomach cancer Neg Hx     ROS: Review of Systems  Constitutional: Negative for weight loss.  Gastrointestinal: Negative for nausea and vomiting.  Musculoskeletal:       Negative muscle weakness  Endo/Heme/Allergies:       Polyphagia Negative hypoglycemia    PHYSICAL EXAM: Blood pressure 108/69, pulse 80, temperature 98.8 F (37.1 C), temperature source Oral, height 5\' 6"  (1.676 m), weight 205 lb (93 kg), last menstrual period 12/02/2014, SpO2 100 %. Body mass index is 33.09 kg/m. Physical Exam  Constitutional: She is oriented to person, place, and time. She appears well-developed and well-nourished.  Cardiovascular: Normal rate.   Pulmonary/Chest: Effort normal.  Musculoskeletal: Normal range of motion.  Neurological: She is oriented to person, place, and time.  Skin: Skin is warm and dry.  Psychiatric: She has a normal mood and affect. Her behavior is normal.  Vitals reviewed.   RECENT LABS AND TESTS: BMET    Component Value Date/Time   NA 138 11/23/2016 1122   K 4.5 11/23/2016 1122   CL 99 11/23/2016 1122   CO2 24 11/23/2016 1122   GLUCOSE 85 11/23/2016 1122   GLUCOSE 92 07/20/2016 0941   BUN 7 11/23/2016 1122   CREATININE 0.86 11/23/2016 1122   CREATININE 0.84 07/20/2016 0941   CALCIUM 9.4 11/23/2016 1122   GFRNONAA 81 11/23/2016 1122   GFRAA 94 11/23/2016 1122   Lab Results    Component Value Date   HGBA1C 5.8 (H) 11/23/2016   Lab Results  Component Value Date   INSULIN 9.3 11/23/2016   CBC    Component Value Date/Time   WBC 5.2 11/23/2016 1122   WBC 5.1 07/20/2016 0941   RBC 4.52 11/23/2016 1122   RBC 4.66 07/20/2016 0941   HGB 13.0 11/23/2016 1122   HCT 38.8 11/23/2016 1122   PLT 313 07/20/2016 0941   MCV 86 11/23/2016 1122   MCH 28.8 11/23/2016 1122  MCH 28.3 07/20/2016 0941   MCHC 33.5 11/23/2016 1122   MCHC 32.8 07/20/2016 0941   RDW 13.8 11/23/2016 1122   LYMPHSABS 1.9 11/23/2016 1122   MONOABS 255 07/20/2016 0941   EOSABS 0.1 11/23/2016 1122   BASOSABS 0.0 11/23/2016 1122   Iron/TIBC/Ferritin/ %Sat    Component Value Date/Time   IRON 104 05/31/2008 0911   FERRITIN 33.9 05/31/2008 0911   IRONPCTSAT 27.3 05/31/2008 0911   Lipid Panel     Component Value Date/Time   CHOL 222 (H) 11/23/2016 1122   TRIG 76 11/23/2016 1122   HDL 44 11/23/2016 1122   CHOLHDL 4.5 07/20/2016 0941   VLDL 13 07/20/2016 0941   LDLCALC 163 (H) 11/23/2016 1122   LDLDIRECT 144.3 06/25/2010 0958   Hepatic Function Panel     Component Value Date/Time   PROT 7.4 11/23/2016 1122   ALBUMIN 4.4 11/23/2016 1122   AST 14 11/23/2016 1122   ALT 12 11/23/2016 1122   ALKPHOS 96 11/23/2016 1122   BILITOT 0.4 11/23/2016 1122   BILIDIR 0.1 08/23/2014 1020      Component Value Date/Time   TSH 1.210 11/23/2016 1122   TSH 1.13 01/10/2015 1007   TSH 0.96 01/03/2014 1408    ASSESSMENT AND PLAN: Prediabetes - Plan: metFORMIN (GLUCOPHAGE) 500 MG tablet  Vitamin D deficiency  Class 1 obesity without serious comorbidity with body mass index (BMI) of 33.0 to 33.9 in adult, unspecified obesity type  PLAN:  Vitamin D Deficiency Cheryl Patterson was informed that low vitamin D levels contributes to fatigue and are associated with obesity, breast, and colon cancer. She agrees to start to take prescription Vit D @50 ,000 IU every week #4 with no refills and will follow up for  routine testing of vitamin D, at least 2-3 times per year. She was informed of the risk of over-replacement of vitamin D and agrees to not increase her dose unless he discusses this with [place] first. Cheryl Patterson agrees to follow up with our clinic in 2 weeks.  Pre-Diabetes Cheryl Patterson will continue to work on weight loss, exercise, and decreasing simple carbohydrates in her diet to help decrease the risk of diabetes. We dicussed metformin including benefits and risks. She was informed that eating too many simple carbohydrates or too many calories at one sitting increases the likelihood of GI side effects. Cheryl Patterson agrees to start metformin for now and a prescription was written today for metformin 500 mg every morning #30 with no refills. Cheryl Patterson agreed to follow up with [place] as directed to monitor her progress.  Obesity Cheryl Patterson is currently in the action stage of change. As such, her goal is to continue with weight loss efforts She has agreed to follow the Category 2 plan Cheryl Patterson has been instructed to work up to a goal of 150 minutes of combined cardio and strengthening exercise per week for weight loss and overall health benefits. We discussed the following Behavioral Modification Strategies today: meal planning & cooking strategies, increasing lean protein intake and decreasing simple carbohydrates    Cheryl Patterson has agreed to follow up with our clinic in 2 weeks. She was informed of the importance of frequent follow up visits to maximize her success with intensive lifestyle modifications for her multiple health conditions.  Cheryl Patterson, Cheryl Patterson, am acting as transcriptionist for Cheryl Nip, Cheryl Patterson  Cheryl Patterson have reviewed the above documentation for accuracy and completeness, and Cheryl Patterson agree with the above. -Cheryl Nip, Cheryl Patterson   OBESITY BEHAVIORAL INTERVENTION VISIT  Today's visit was # 2 out of  22.  Starting weight: 203 lbs Starting date: 11/23/16 Today's weight : 205 lbs Today's date: 12/21/2016 Total lbs lost to  date: 0 (Patients must lose 7 lbs in the first 6 months to continue with counseling)   ASK: We discussed the diagnosis of obesity with Cheryl Patterson today and Cheryl Patterson agreed to give [place] permission to discuss obesity behavioral modification therapy today.  ASSESS: Cheryl Patterson has the diagnosis of obesity and her BMI today is 33.1 Cheryl Patterson is in the action stage of change   ADVISE: Cheryl Patterson was educated on the multiple health risks of obesity as well as the benefit of weight loss to improve her health. She was advised of the need for long term treatment and the importance of lifestyle modifications.  AGREE: Multiple dietary modification options and treatment options were discussed and  Cheryl Patterson agreed to follow the Category 2 plan We discussed the following Behavioral Modification Strategies today: meal planning & cooking strategies, increasing lean protein intake and decreasing simple carbohydrates

## 2016-12-27 ENCOUNTER — Ambulatory Visit (INDEPENDENT_AMBULATORY_CARE_PROVIDER_SITE_OTHER): Payer: BLUE CROSS/BLUE SHIELD | Admitting: Family Medicine

## 2016-12-27 VITALS — BP 101/68 | HR 73 | Temp 98.4°F | Ht 66.0 in | Wt 205.0 lb

## 2016-12-27 DIAGNOSIS — Z6833 Body mass index (BMI) 33.0-33.9, adult: Secondary | ICD-10-CM | POA: Diagnosis not present

## 2016-12-27 DIAGNOSIS — E669 Obesity, unspecified: Secondary | ICD-10-CM

## 2016-12-27 DIAGNOSIS — F3289 Other specified depressive episodes: Secondary | ICD-10-CM

## 2016-12-27 DIAGNOSIS — R7303 Prediabetes: Secondary | ICD-10-CM | POA: Diagnosis not present

## 2016-12-27 DIAGNOSIS — Z9189 Other specified personal risk factors, not elsewhere classified: Secondary | ICD-10-CM

## 2016-12-27 MED ORDER — BUPROPION HCL ER (SR) 150 MG PO TB12: 150.0000 mg | ORAL_TABLET | Freq: Every day | ORAL | 0 refills | Status: DC

## 2016-12-28 ENCOUNTER — Encounter (INDEPENDENT_AMBULATORY_CARE_PROVIDER_SITE_OTHER): Payer: BLUE CROSS/BLUE SHIELD | Admitting: Family Medicine

## 2016-12-28 NOTE — Progress Notes (Signed)
Office: [phone]  /  Fax: [phone]   HPI:   Chief Complaint: OBESITY Cheryl Patterson is here to discuss her progress with her obesity treatment plan. She is on the Category 2 plan and is following her eating plan approximately 40 % of the time. She states she is walking for 60 minutes 3 times per week. [name] is stable on the category 2 plan but she notes increased emotional eating and then skips her healthy food to help control calories. Her weight is 205 lb (93 kg) today and has maintained weight over a period of 2 weeks since her last visit. She has gained 2 lbs since starting treatment with [place].  Pre-Diabetes Cheryl Patterson has a diagnosis of pre-diabetes based on her elevated Hgb A1c and was informed this puts her at greater risk of developing diabetes. She is stable on metformin but she had some mild GI upset for the first 2 to 3 days and this has since resolved. She continues to work on diet and exercise to decrease risk of diabetes. She denies nausea or hypoglycemia.  Depression with emotional eating behaviors Cheryl Patterson notes increased emotional eating that is worse in the pm. She often turns to simple carbohydrates even when she is not hungry. Cheryl Patterson struggles with emotional eating and using food for comfort to the extent that it is negatively impacting her health. She often snacks when she is not hungry. Cheryl Patterson sometimes feels she is out of control and then feels guilty that she made poor food choices. She has been working on behavior modification techniques to help reduce her emotional eating and has been somewhat successful. She shows no sign of suicidal or homicidal ideations.  Depression screen [facility] 2/9 11/23/2016 04/02/2016  Decreased Interest 3 2  Down, Depressed, Hopeless 1 2  PHQ - 2 Score 4 4  Altered sleeping 2 3  Tired, decreased energy 3 3  Change in appetite 3 3  Feeling bad or failure about yourself  2 0  Trouble concentrating 2 3  Moving slowly or fidgety/restless 0  0  Suicidal thoughts 0 0  PHQ-9 Score 16 16  Difficult doing work/chores - Somewhat difficult        ALLERGIES: Allergies  Allergen Reactions   Codeine     Itchy throat, whelps on skin   Other Swelling and Rash    tomatoes    MEDICATIONS: Current Outpatient Prescriptions on File Prior to Visit  Medication Sig Dispense Refill   metFORMIN (GLUCOPHAGE) 500 MG tablet Take 1 tablet (500 mg total) by mouth every morning. 30 tablet 0   Multiple Vitamins-Minerals (HAIR SKIN AND NAILS FORMULA PO) Take by mouth daily.     Current Facility-Administered Medications on File Prior to Visit  Medication Dose Route Frequency Provider Last Rate Last Dose   0.9 %  sodium chloride infusion  500 mL Intravenous Continuous Milus Banister, MD        PAST MEDICAL HISTORY: Past Medical History:  Diagnosis Date   ADD (attention deficit disorder)    Allergy    Anxiety    Asthma    per pt- bronchitis brings on the symptoms   Bipolar depression ([place])    Chronic headaches    Chronic kidney disease    kidney stones   Constipation    Depression    [facility]---suicidal ideation   GERD (gastroesophageal reflux disease)    Hyperlipidemia    no meds taken as of 04-20-44   IBS (irritable bowel syndrome)  Joint pain    Kidney infection    Lactose intolerance    Ruptured disc, thoracic    Sepsis ([place])    2014-  d/t urinary infection   UTI (urinary tract infection)     PAST SURGICAL HISTORY: Past Surgical History:  Procedure Laterality Date   BREAST SURGERY     left breast lump removed   COLONOSCOPY     CYSTOSCOPY N/A 03/18/2015   Procedure: CYSTOSCOPY;  Surgeon: Terrance Mass, MD;  Location: [facility];  Service: Gynecology;  Laterality: N/A;   LAPAROSCOPIC BILATERAL SALPINGECTOMY Bilateral 03/18/2015   Procedure: LAPAROSCOPIC BILATERAL SALPINGECTOMY;  Surgeon: Terrance Mass, MD;  Location: [facility];  Service: Gynecology;  Laterality: Bilateral;     LAPAROSCOPIC VAGINAL HYSTERECTOMY WITH SALPINGO OOPHORECTOMY N/A 03/18/2015   Procedure: LAPAROSCOPIC ASSISTED VAGINAL HYSTERECTOMY WITH SALPINGO OOPHORECTOMY;  Surgeon: Terrance Mass, MD;  Location: [facility];  Service: Gynecology;  Laterality: N/A;   LEEP  2005   LIPOMA EXCISION     MOUTH SURGERY     OTHER SURGICAL HISTORY     RENAL STONE REMOVAL   TUBAL LIGATION      SOCIAL HISTORY: Social History  Substance Use Topics   Smoking status: Never Smoker   Smokeless tobacco: Never Used   Alcohol use No    FAMILY HISTORY: Family History  Problem Relation Age of Onset   Colon cancer Maternal Aunt    Cancer Maternal Aunt        colon   Breast cancer Mother    Colon cancer Mother 80   Cancer Mother 55       colon   Anxiety disorder Mother    Hypertension Father    Hyperlipidemia Father    Hypertension Unknown    Stroke Paternal Grandmother    Hypertension Paternal Grandmother    Heart disease Paternal Grandmother    Breast cancer Paternal Aunt    Cancer Paternal Aunt        breast   Uterine cancer Maternal Aunt    Brain cancer Maternal Aunt    Diabetes Maternal Grandfather    Cancer Maternal Uncle        COLON   Colon cancer Paternal Uncle    Esophageal cancer Neg Hx    Rectal cancer Neg Hx    Stomach cancer Neg Hx     ROS: Review of Systems  Constitutional: Negative for malaise/fatigue.  Gastrointestinal: Negative for nausea.  Endo/Heme/Allergies:       Negative hypoglycemia  Psychiatric/Behavioral: Positive for depression. Negative for suicidal ideas.    PHYSICAL EXAM: Blood pressure 101/68, pulse 73, temperature 98.4 F (36.9 C), temperature source Oral, height 5\' 6"  (1.676 m), weight 205 lb (93 kg), last menstrual period 12/02/2014, SpO2 99 %. Body mass index is 33.09 kg/m. Physical Exam  Constitutional: She is oriented to person, place, and time. She appears well-developed and well-nourished.  Cardiovascular: Normal  rate.   Pulmonary/Chest: Effort normal.  Musculoskeletal: Normal range of motion.  Neurological: She is oriented to person, place, and time.  Skin: Skin is warm and dry.  Psychiatric: She has a normal mood and affect. Her behavior is normal.  Vitals reviewed.   RECENT LABS AND TESTS: BMET    Component Value Date/Time   NA 138 11/23/2016 1122   K 4.5 11/23/2016 1122   CL 99 11/23/2016 1122   CO2 24 11/23/2016 1122   GLUCOSE 85 11/23/2016 1122   GLUCOSE 92 07/20/2016 0941   BUN 7 11/23/2016 1122  CREATININE 0.86 11/23/2016 1122   CREATININE 0.84 07/20/2016 0941   CALCIUM 9.4 11/23/2016 1122   GFRNONAA 81 11/23/2016 1122   GFRAA 94 11/23/2016 1122   Lab Results  Component Value Date   HGBA1C 5.8 (H) 11/23/2016   Lab Results  Component Value Date   INSULIN 9.3 11/23/2016   CBC    Component Value Date/Time   WBC 5.2 11/23/2016 1122   WBC 5.1 07/20/2016 0941   RBC 4.52 11/23/2016 1122   RBC 4.66 07/20/2016 0941   HGB 13.0 11/23/2016 1122   HCT 38.8 11/23/2016 1122   PLT 313 07/20/2016 0941   MCV 86 11/23/2016 1122   MCH 28.8 11/23/2016 1122   MCH 28.3 07/20/2016 0941   MCHC 33.5 11/23/2016 1122   MCHC 32.8 07/20/2016 0941   RDW 13.8 11/23/2016 1122   LYMPHSABS 1.9 11/23/2016 1122   MONOABS 255 07/20/2016 0941   EOSABS 0.1 11/23/2016 1122   BASOSABS 0.0 11/23/2016 1122   Iron/TIBC/Ferritin/ %Sat    Component Value Date/Time   IRON 104 05/31/2008 0911   FERRITIN 33.9 05/31/2008 0911   IRONPCTSAT 27.3 05/31/2008 0911   Lipid Panel     Component Value Date/Time   CHOL 222 (H) 11/23/2016 1122   TRIG 76 11/23/2016 1122   HDL 44 11/23/2016 1122   CHOLHDL 4.5 07/20/2016 0941   VLDL 13 07/20/2016 0941   LDLCALC 163 (H) 11/23/2016 1122   LDLDIRECT 144.3 06/25/2010 0958   Hepatic Function Panel     Component Value Date/Time   PROT 7.4 11/23/2016 1122   ALBUMIN 4.4 11/23/2016 1122   AST 14 11/23/2016 1122   ALT 12 11/23/2016 1122   ALKPHOS 96 11/23/2016  1122   BILITOT 0.4 11/23/2016 1122   BILIDIR 0.1 08/23/2014 1020      Component Value Date/Time   TSH 1.210 11/23/2016 1122   TSH 1.13 01/10/2015 1007   TSH 0.96 01/03/2014 1408    ASSESSMENT AND PLAN: Other depression - Plan: buPROPion (WELLBUTRIN SR) 150 MG 12 hr tablet  Prediabetes  At risk for obstructive sleep apnea  Class 1 obesity with serious comorbidity and body mass index (BMI) of 33.0 to 33.9 in adult, unspecified obesity type  PLAN:  Pre-Diabetes Fahima will continue to work on weight loss, exercise, and decreasing simple carbohydrates in her diet to help decrease the risk of diabetes. We dicussed metformin including benefits and risks. She was informed that eating too many simple carbohydrates or too many calories at one sitting increases the likelihood of GI side effects. Sherryn agrees to continue metformin for now and a prescription was not written today. Jeanie agreed to follow up with [place] as directed to monitor her progress.  Depression with Emotional Eating Behaviors We discussed behavior modification techniques today to help Jaielle deal with her emotional eating and depression. She has agreed to take Wellbutrin SR 150 mg every morning #30 with no refills and will follow up as directed.  Obstructive Sleep Apnea Risk Counseling Stefhanie was given extended (at least 15 minutes) coronary artery disease prevention counseling today. She is 46 y.o. female and has risk factors for obstructive sleep apnea including obesity. We discussed intensive lifestyle modifications today with an emphasis on specific weight loss instructions and strategies.  Obesity Calyn is currently in the action stage of change. As such, her goal is to continue with weight loss efforts She has agreed to follow the Category 2 plan Iyani has been instructed to work up to a goal of 150 minutes  of combined cardio and strengthening exercise per week for weight loss and overall health  benefits. We discussed the following Behavioral Modification Strategies today: increasing lean protein intake, decreasing simple carbohydrates and emotional eating strategies  Fiana has agreed to follow up with our clinic in 2 weeks. She was informed of the importance of frequent follow up visits to maximize her success with intensive lifestyle modifications for her multiple health conditions.  I, Doreene Nest, am acting as transcriptionist for Dennard Nip, MD  I have reviewed the above documentation for accuracy and completeness, and I agree with the above. -Dennard Nip, MD   OBESITY BEHAVIORAL INTERVENTION VISIT  Today's visit was # 3 out of 22.  Starting weight: 203 lbs Starting date: 11/23/16 Today's weight : 205 lbs  Today's date: 12/27/2016 Total lbs lost to date: 0 (Patients must lose 7 lbs in the first 6 months to continue with counseling)   ASK: We discussed the diagnosis of obesity with Erik Obey Rings today and Smriti agreed to give [place] permission to discuss obesity behavioral modification therapy today.  ASSESS: Shelene has the diagnosis of obesity and her BMI today is 33.1 [place] is in the action stage of change   ADVISE: Beulah was educated on the multiple health risks of obesity as well as the benefit of weight loss to improve her health. She was advised of the need for long term treatment and the importance of lifestyle modifications.  AGREE: Multiple dietary modification options and treatment options were discussed and  Ashten agreed to follow the Category 2 plan We discussed the following Behavioral Modification Strategies today: increasing lean protein intake, decreasing simple carbohydrates and emotional eating strategies

## 2017-01-11 ENCOUNTER — Ambulatory Visit (INDEPENDENT_AMBULATORY_CARE_PROVIDER_SITE_OTHER): Payer: BLUE CROSS/BLUE SHIELD | Admitting: Physician Assistant

## 2017-01-25 ENCOUNTER — Encounter (INDEPENDENT_AMBULATORY_CARE_PROVIDER_SITE_OTHER): Payer: Self-pay

## 2017-01-25 ENCOUNTER — Ambulatory Visit (INDEPENDENT_AMBULATORY_CARE_PROVIDER_SITE_OTHER): Payer: BLUE CROSS/BLUE SHIELD | Admitting: Physician Assistant

## 2017-03-02 ENCOUNTER — Telehealth: Payer: Self-pay | Admitting: Family Medicine

## 2017-03-02 NOTE — Telephone Encounter (Signed)
Will this be ok with you?

## 2017-03-02 NOTE — Telephone Encounter (Signed)
Pt would like to have cpe before the end of the year. PCP  Is scheduled out through the end of the year and would like to know if Cheryl Patterson would complete her cpe?  Please advise for scheduling  cb [phone]

## 2017-03-03 NOTE — Telephone Encounter (Signed)
Patient has been scheduled

## 2017-03-03 NOTE — Telephone Encounter (Signed)
That is fine 

## 2017-03-08 ENCOUNTER — Encounter: Payer: BLUE CROSS/BLUE SHIELD | Admitting: Medical

## 2017-03-14 ENCOUNTER — Encounter: Payer: BLUE CROSS/BLUE SHIELD | Admitting: Medical

## 2017-03-14 ENCOUNTER — Encounter: Payer: Self-pay | Admitting: Family Medicine

## 2017-07-22 ENCOUNTER — Encounter: Payer: Self-pay | Admitting: Family Medicine

## 2017-07-22 ENCOUNTER — Ambulatory Visit: Payer: BLUE CROSS/BLUE SHIELD | Admitting: Family Medicine

## 2017-07-22 ENCOUNTER — Telehealth: Payer: Self-pay

## 2017-07-22 VITALS — BP 108/57 | HR 77 | Temp 98.7°F | Resp 16 | Ht 66.0 in | Wt 217.8 lb

## 2017-07-22 DIAGNOSIS — F988 Other specified behavioral and emotional disorders with onset usually occurring in childhood and adolescence: Secondary | ICD-10-CM

## 2017-07-22 MED ORDER — AMPHETAMINE-DEXTROAMPHETAMINE 10 MG PO TABS: 10.0000 mg | ORAL_TABLET | Freq: Two times a day (BID) | ORAL | 0 refills | Status: DC

## 2017-07-22 NOTE — Telephone Encounter (Signed)
PA initiated via Covermymeds; KEY: [id number]. Awaiting determination.

## 2017-07-22 NOTE — Telephone Encounter (Signed)
Request Reference Number: [id number]. AMPHET/DEXTR TAB 10MG  is approved through 07/23/2018. For further questions, call [phone].

## 2017-07-22 NOTE — Progress Notes (Signed)
Patient ID: Cheryl Patterson, female    DOB: Jun 10, 1970  Age: 47 y.o. MRN: [id number]    Subjective:  Subjective  HPI Cheryl Patterson presents to discuss getting back on adderall.  She is struggling with concentration at work and thought she could do without the adderall but would like to go back on it/  Review of Systems  Constitutional: Negative for activity change, appetite change, diaphoresis, fatigue and unexpected weight change.  Eyes: Negative for pain, redness and visual disturbance.  Respiratory: Negative for cough, chest tightness, shortness of breath and wheezing.   Cardiovascular: Negative for chest pain, palpitations and leg swelling.  Endocrine: Negative for cold intolerance, heat intolerance, polydipsia, polyphagia and polyuria.  Genitourinary: Negative for difficulty urinating, dysuria and frequency.  Neurological: Negative for dizziness, light-headedness, numbness and headaches.  Psychiatric/Behavioral: Positive for decreased concentration. Negative for behavioral problems, dysphoric mood, self-injury, sleep disturbance and suicidal ideas. The patient is not nervous/anxious.     History Past Medical History:  Diagnosis Date  . ADD (attention deficit disorder)   . Allergy   . Anxiety   . Asthma    per pt- bronchitis brings on the symptoms  . Bipolar depression ([place])   . Chronic headaches   . Chronic kidney disease    kidney stones  . Constipation   . Depression    [facility]---suicidal ideation  . GERD (gastroesophageal reflux disease)   . Hyperlipidemia    no meds taken as of 04-20-16  . IBS (irritable bowel syndrome)   . Joint pain   . Kidney infection   . Lactose intolerance   . Ruptured disc, thoracic   . Sepsis ([place])    2014-  d/t urinary infection  . UTI (urinary tract infection)     She has a past surgical history that includes Tubal ligation; LEEP (2005); Breast surgery; Other surgical history; Mouth surgery; Lipoma excision; Laparoscopic  vaginal hysterectomy with salpingo oophorectomy (N/A, 03/18/2015); Cystoscopy (N/A, 03/18/2015); Laparoscopic bilateral salpingectomy (Bilateral, 03/18/2015); and Colonoscopy.   Her family history includes Anxiety disorder in her mother; Brain cancer in her maternal aunt; Breast cancer in her mother and paternal aunt; Cancer in her maternal aunt, maternal uncle, and paternal aunt; Cancer (age of onset: 82) in her mother; Colon cancer in her maternal aunt and paternal uncle; Colon cancer (age of onset: 14) in her mother; Diabetes in her maternal grandfather; Heart disease in her paternal grandmother; Hyperlipidemia in her father; Hypertension in her father, paternal grandmother, and unknown relative; Stroke in her paternal grandmother; Uterine cancer in her maternal aunt.She reports that she has never smoked. She has never used smokeless tobacco. She reports that she does not drink alcohol or use drugs.  Current Outpatient Medications on File Prior to Visit  Medication Sig Dispense Refill  . Multiple Vitamins-Minerals (HAIR SKIN AND NAILS FORMULA PO) Take by mouth daily.    Marland Kitchen buPROPion (WELLBUTRIN SR) 150 MG 12 hr tablet Take 1 tablet (150 mg total) by mouth daily. (Patient not taking: Reported on 07/22/2017) 30 tablet 0  . metFORMIN (GLUCOPHAGE) 500 MG tablet Take 1 tablet (500 mg total) by mouth every morning. (Patient not taking: Reported on 07/22/2017) 30 tablet 0   Current Facility-Administered Medications on File Prior to Visit  Medication Dose Route Frequency Provider Last Rate Last Dose  . 0.9 %  sodium chloride infusion  500 mL Intravenous Continuous Milus Banister, MD         Objective:  Objective  Physical Exam  Constitutional: She is oriented to person, place, and time. She appears well-developed and well-nourished.  HENT:  Head: Normocephalic and atraumatic.  Eyes: Conjunctivae and EOM are normal.  Neck: Normal range of motion. Neck supple. No JVD present. Carotid bruit is not  present. No thyromegaly present.  Cardiovascular: Normal rate, regular rhythm and normal heart sounds.  No murmur heard. Pulmonary/Chest: Effort normal and breath sounds normal. No respiratory distress. She has no wheezes. She has no rales. She exhibits no tenderness.  Musculoskeletal: She exhibits no edema.  Neurological: She is alert and oriented to person, place, and time.  Psychiatric: She has a normal mood and affect. Her speech is normal. Thought content normal. Her mood appears not anxious. Her affect is not angry, not blunt, not labile and not inappropriate. Cognition and memory are normal. She does not exhibit a depressed mood.  Pt is having trouble focusing and would like to go back on adderall  She is inattentive.  Nursing note and vitals reviewed.  BP (!) 108/57   Pulse 77   Temp 98.7 F (37.1 C) (Oral)   Resp 16   Ht 5\' 6"  (1.676 m)   Wt 217 lb 12.8 oz (98.8 kg)   LMP 12/02/2014   SpO2 100%   BMI 35.15 kg/m  Wt Readings from Last 3 Encounters:  07/22/17 217 lb 12.8 oz (98.8 kg)  12/27/16 205 lb (93 kg)  12/13/16 205 lb (93 kg)     Lab Results  Component Value Date   WBC 5.2 11/23/2016   HGB 13.0 11/23/2016   HCT 38.8 11/23/2016   PLT 313 07/20/2016   GLUCOSE 85 11/23/2016   CHOL 222 (H) 11/23/2016   TRIG 76 11/23/2016   HDL 44 11/23/2016   LDLDIRECT 144.3 06/25/2010   LDLCALC 163 (H) 11/23/2016   ALT 12 11/23/2016   AST 14 11/23/2016   NA 138 11/23/2016   K 4.5 11/23/2016   CL 99 11/23/2016   CREATININE 0.86 11/23/2016   BUN 7 11/23/2016   CO2 24 11/23/2016   TSH 1.210 11/23/2016   HGBA1C 5.8 (H) 11/23/2016    Dg Chest 2 View  Result Date: 12/11/2015 CLINICAL DATA:  Cough for 2 months, bronchitis EXAM: CHEST  2 VIEW COMPARISON:  08/23/2014 FINDINGS: The heart size and mediastinal contours are within normal limits. Both lungs are clear. The visualized skeletal structures are unremarkable. IMPRESSION: No active cardiopulmonary disease. Electronically  Signed   By: Lahoma Crocker M.D.   On: 12/11/2015 15:24     Assessment & Plan:  Plan  I am having Arlet L. Erdmann start on amphetamine-dextroamphetamine. I am also having her maintain her Multiple Vitamins-Minerals (HAIR SKIN AND NAILS FORMULA PO), metFORMIN, and buPROPion. We will continue to administer sodium chloride.  Meds ordered this encounter  Medications  . amphetamine-dextroamphetamine (ADDERALL) 10 MG tablet    Sig: Take 1 tablet (10 mg total) by mouth 2 (two) times daily.    Dispense:  60 tablet    Refill:  0    Problem List Items Addressed This Visit    None    Visit Diagnoses    Attention deficit disorder (ADD) without hyperactivity    -  Primary   Relevant Medications   amphetamine-dextroamphetamine (ADDERALL) 10 MG tablet   Other Relevant Orders   EKG 12-Lead (Completed)   Pain Mgmt, Profile 8 w/Conf, U    f/u 6 months or sooner prn  ekg nsr   Follow-up: Return in about 6 months (around 01/21/2018), or  if symptoms worsen or fail to improve, for add.  Ann Held, DO

## 2017-10-24 ENCOUNTER — Other Ambulatory Visit: Payer: Self-pay | Admitting: Family Medicine

## 2017-10-24 ENCOUNTER — Encounter: Payer: Self-pay | Admitting: Family Medicine

## 2017-10-24 ENCOUNTER — Ambulatory Visit (HOSPITAL_BASED_OUTPATIENT_CLINIC_OR_DEPARTMENT_OTHER)
Admission: RE | Admit: 2017-10-24 | Discharge: 2017-10-24 | Disposition: A | Discharge status: Home / Self Care | Payer: Managed Care, Other (non HMO) | Source: Ambulatory Visit | Attending: Family Medicine | Admitting: Family Medicine

## 2017-10-24 ENCOUNTER — Ambulatory Visit (INDEPENDENT_AMBULATORY_CARE_PROVIDER_SITE_OTHER): Payer: Managed Care, Other (non HMO) | Admitting: Family Medicine

## 2017-10-24 VITALS — BP 110/76 | HR 72 | Temp 99.1°F | Resp 16 | Ht 66.0 in | Wt 217.2 lb

## 2017-10-24 DIAGNOSIS — J302 Other seasonal allergic rhinitis: Secondary | ICD-10-CM | POA: Diagnosis not present

## 2017-10-24 DIAGNOSIS — K219 Gastro-esophageal reflux disease without esophagitis: Secondary | ICD-10-CM | POA: Diagnosis not present

## 2017-10-24 DIAGNOSIS — F988 Other specified behavioral and emotional disorders with onset usually occurring in childhood and adolescence: Secondary | ICD-10-CM | POA: Diagnosis not present

## 2017-10-24 DIAGNOSIS — R05 Cough: Secondary | ICD-10-CM | POA: Insufficient documentation

## 2017-10-24 DIAGNOSIS — R5383 Other fatigue: Secondary | ICD-10-CM | POA: Diagnosis not present

## 2017-10-24 DIAGNOSIS — Z79899 Other long term (current) drug therapy: Secondary | ICD-10-CM

## 2017-10-24 DIAGNOSIS — R059 Cough, unspecified: Secondary | ICD-10-CM | POA: Insufficient documentation

## 2017-10-24 DIAGNOSIS — J40 Bronchitis, not specified as acute or chronic: Secondary | ICD-10-CM

## 2017-10-24 LAB — CBC WITH DIFFERENTIAL/PLATELET
BASOS ABS: 0 10*3/uL (ref 0.0–0.1)
Basophils Relative: 1 % (ref 0.0–3.0)
Eosinophils Absolute: 0.1 10*3/uL (ref 0.0–0.7)
Eosinophils Relative: 1.5 % (ref 0.0–5.0)
HCT: 39.5 % (ref 36.0–46.0)
HEMOGLOBIN: 13.1 g/dL (ref 12.0–15.0)
LYMPHS ABS: 1.7 10*3/uL (ref 0.7–4.0)
Lymphocytes Relative: 37.5 % (ref 12.0–46.0)
MCHC: 33.2 g/dL (ref 30.0–36.0)
MCV: 87.6 fl (ref 78.0–100.0)
MONO ABS: 0.3 10*3/uL (ref 0.1–1.0)
MONOS PCT: 5.9 % (ref 3.0–12.0)
Neutro Abs: 2.5 10*3/uL (ref 1.4–7.7)
Neutrophils Relative %: 54.1 % (ref 43.0–77.0)
Platelets: 282 10*3/uL (ref 150.0–400.0)
RBC: 4.51 Mil/uL (ref 3.87–5.11)
RDW: 13.2 % (ref 11.5–15.5)
WBC: 4.6 10*3/uL (ref 4.0–10.5)

## 2017-10-24 LAB — COMPREHENSIVE METABOLIC PANEL
ALT: 12 U/L (ref 0–35)
AST: 16 U/L (ref 0–37)
Albumin: 4.1 g/dL (ref 3.5–5.2)
Alkaline Phosphatase: 85 U/L (ref 39–117)
BILIRUBIN TOTAL: 0.4 mg/dL (ref 0.2–1.2)
BUN: 8 mg/dL (ref 6–23)
CHLORIDE: 100 meq/L (ref 96–112)
CO2: 33 meq/L — AB (ref 19–32)
CREATININE: 0.8 mg/dL (ref 0.40–1.20)
Calcium: 9.5 mg/dL (ref 8.4–10.5)
GFR: 98.66 mL/min (ref >60.00)
GLUCOSE: 97 mg/dL (ref 70–99)
Potassium: 4.6 mEq/L (ref 3.5–5.1)
Sodium: 139 mEq/L (ref 135–145)
Total Protein: 7 g/dL (ref 6.0–8.3)

## 2017-10-24 LAB — TSH: TSH: 1.16 u[IU]/mL (ref 0.35–4.50)

## 2017-10-24 LAB — VITAMIN D 25 HYDROXY (VIT D DEFICIENCY, FRACTURES): VITD: 16.62 ng/mL — ABNORMAL LOW (ref 30.00–100.00)

## 2017-10-24 LAB — VITAMIN B12: VITAMIN B 12: 860 pg/mL (ref 211–911)

## 2017-10-24 MED ORDER — FLUTICASONE PROPIONATE 50 MCG/ACT NA SUSP: 2.0000 | Freq: Every day | NASAL | 6 refills | Status: DC

## 2017-10-24 MED ORDER — AMPHETAMINE-DEXTROAMPHETAMINE 10 MG PO TABS: 10.0000 mg | ORAL_TABLET | Freq: Two times a day (BID) | ORAL | 0 refills | Status: DC

## 2017-10-24 MED ORDER — LORATADINE 10 MG PO TABS: 10.0000 mg | ORAL_TABLET | Freq: Every day | ORAL | 11 refills | Status: DC

## 2017-10-24 MED ORDER — OMEPRAZOLE 20 MG PO CPDR: 20.0000 mg | DELAYED_RELEASE_CAPSULE | Freq: Every day | ORAL | 3 refills | Status: DC

## 2017-10-24 MED ORDER — PREDNISONE 10 MG PO TABS: ORAL_TABLET | ORAL | 0 refills | Status: DC

## 2017-10-24 MED ORDER — PROMETHAZINE-DM 6.25-15 MG/5ML PO SYRP: 5.0000 mL | ORAL_SOLUTION | Freq: Four times a day (QID) | ORAL | 0 refills | Status: DC | PRN

## 2017-10-24 NOTE — Assessment & Plan Note (Signed)
Stable con't adderall  

## 2017-10-24 NOTE — Progress Notes (Signed)
Patient ID: Cheryl Patterson, female   DOB: 1970/07/17, 47 y.o.   MRN: [id number]    Subjective:  I acted as a Education administrator for Dr. Carollee Herter.  Guerry Bruin, [place]   Patient ID: Cheryl Patterson, female    DOB: 03/29/1971, 47 y.o.   MRN: [id number]  Chief Complaint  Patient presents with  . Cough  . ADD    Cough  This is a new problem. Episode onset: may. The cough is productive of sputum. Associated symptoms include myalgias. Pertinent negatives include no chest pain, ear congestion, ear pain, fever, headaches, nasal congestion, postnasal drip, rash, sore throat or shortness of breath. Associated symptoms comments: Jaw pain Ear popping . Treatments tried: Mucinex.   pt has also tried some claritin and flonase with some relief  + productive cough-- yellow mucus Using proair daily Patient is in today for cough and follow up ADD.  Patient Care Team: Carollee Herter, Alferd Apa, DO as PCP - General Terrance Mass, MD as Consulting Physician (Gynecology) Huel Cote, NP as Nurse Practitioner (Obstetrics and Gynecology)   Past Medical History:  Diagnosis Date  . ADD (attention deficit disorder)   . Allergy   . Anxiety   . Asthma    per pt- bronchitis brings on the symptoms  . Bipolar depression ([place])   . Chronic headaches   . Chronic kidney disease    kidney stones  . Constipation   . Depression    [facility]---suicidal ideation  . GERD (gastroesophageal reflux disease)   . Hyperlipidemia    no meds taken as of 04-20-16  . IBS (irritable bowel syndrome)   . Joint pain   . Kidney infection   . Lactose intolerance   . Ruptured disc, thoracic   . Sepsis ([place])    2014-  d/t urinary infection  . UTI (urinary tract infection)     Past Surgical History:  Procedure Laterality Date  . BREAST SURGERY     left breast lump removed  . COLONOSCOPY    . CYSTOSCOPY N/A 03/18/2015   Procedure: CYSTOSCOPY;  Surgeon: Terrance Mass, MD;  Location: [facility];  Service: Gynecology;   Laterality: N/A;  . LAPAROSCOPIC BILATERAL SALPINGECTOMY Bilateral 03/18/2015   Procedure: LAPAROSCOPIC BILATERAL SALPINGECTOMY;  Surgeon: Terrance Mass, MD;  Location: [facility];  Service: Gynecology;  Laterality: Bilateral;  . LAPAROSCOPIC VAGINAL HYSTERECTOMY WITH SALPINGO OOPHORECTOMY N/A 03/18/2015   Procedure: LAPAROSCOPIC ASSISTED VAGINAL HYSTERECTOMY WITH SALPINGO OOPHORECTOMY;  Surgeon: Terrance Mass, MD;  Location: [facility];  Service: Gynecology;  Laterality: N/A;  . LEEP  2005  . LIPOMA EXCISION    . MOUTH SURGERY    . OTHER SURGICAL HISTORY     RENAL STONE REMOVAL  . TUBAL LIGATION      Family History  Problem Relation Age of Onset  . Colon cancer Maternal Aunt   . Cancer Maternal Aunt        colon  . Breast cancer Mother   . Colon cancer Mother 37  . Cancer Mother 62       colon  . Anxiety disorder Mother   . Hypertension Father   . Hyperlipidemia Father   . Hypertension Unknown   . Stroke Paternal Grandmother   . Hypertension Paternal Grandmother   . Heart disease Paternal Grandmother   . Breast cancer Paternal Aunt   . Cancer Paternal Aunt        breast  . Uterine cancer Maternal Aunt   . Brain  cancer Maternal Aunt   . Diabetes Maternal Grandfather   . Cancer Maternal Uncle        COLON  . Colon cancer Paternal Uncle   . Esophageal cancer Neg Hx   . Rectal cancer Neg Hx   . Stomach cancer Neg Hx     Social History   Socioeconomic History  . Marital status: Married    Spouse name: Octavia Bruckner  . Number of children: 2  . Years of education: Not on file  . Highest education level: Not on file  Occupational History  . Occupation: Lobbyist: [employer]  . Financial resource strain: Not on file  . Food insecurity:    Worry: Not on file    Inability: Not on file  . Transportation needs:    Medical: Not on file    Non-medical: Not on file  Tobacco Use  . Smoking status: Never Smoker  . Smokeless tobacco:  Never Used  Substance and Sexual Activity  . Alcohol use: No    Alcohol/week: 0.0 oz  . Drug use: No  . Sexual activity: Yes    Partners: Male    Birth control/protection: None  Lifestyle  . Physical activity:    Days per week: Not on file    Minutes per session: Not on file  . Stress: Not on file  Relationships  . Social connections:    Talks on phone: Not on file    Gets together: Not on file    Attends religious service: Not on file    Active member of club or organization: Not on file    Attends meetings of clubs or organizations: Not on file    Relationship status: Not on file  . Intimate partner violence:    Fear of current or ex partner: Not on file    Emotionally abused: Not on file    Physically abused: Not on file    Forced sexual activity: Not on file  Other Topics Concern  . Not on file  Social History Narrative   Exercise--walk qd                    Video--  rare    Outpatient Medications Prior to Visit  Medication Sig Dispense Refill  . Albuterol Sulfate (PROAIR RESPICLICK) 824 (90 Base) MCG/ACT AEPB Inhale 2 puffs into the lungs as needed.    Marland Kitchen amphetamine-dextroamphetamine (ADDERALL) 10 MG tablet Take 1 tablet (10 mg total) by mouth 2 (two) times daily. 60 tablet 0  . buPROPion (WELLBUTRIN SR) 150 MG 12 hr tablet Take 1 tablet (150 mg total) by mouth daily. (Patient not taking: Reported on 07/22/2017) 30 tablet 0  . metFORMIN (GLUCOPHAGE) 500 MG tablet Take 1 tablet (500 mg total) by mouth every morning. (Patient not taking: Reported on 07/22/2017) 30 tablet 0  . Multiple Vitamins-Minerals (HAIR SKIN AND NAILS FORMULA PO) Take by mouth daily.     Facility-Administered Medications Prior to Visit  Medication Dose Route Frequency Provider Last Rate Last Dose  . 0.9 %  sodium chloride infusion  500 mL Intravenous Continuous Milus Banister, MD        Allergies  Allergen Reactions  . Codeine     Itchy throat, whelps on skin  . Other Swelling and Rash     tomatoes    Review of Systems  Constitutional: Positive for malaise/fatigue. Negative for fever.  HENT: Negative for congestion, ear pain, postnasal drip  and sore throat.   Eyes: Negative for blurred vision.  Respiratory: Positive for cough. Negative for shortness of breath.   Cardiovascular: Negative for chest pain, palpitations and leg swelling.  Gastrointestinal: Negative for vomiting.  Musculoskeletal: Positive for myalgias. Negative for back pain.  Skin: Negative for rash.  Neurological: Negative for loss of consciousness and headaches.       Objective:    Physical Exam  Constitutional: She is oriented to person, place, and time. She appears well-developed and well-nourished.  HENT:  Right Ear: External ear normal.  Left Ear: External ear normal.  + PND + errythema  Eyes: Conjunctivae are normal. Right eye exhibits no discharge. Left eye exhibits no discharge.  Cardiovascular: Normal rate, regular rhythm and normal heart sounds.  No murmur heard. Pulmonary/Chest: Effort normal. No respiratory distress. She has decreased breath sounds. She has wheezes. She has no rales. She exhibits no tenderness.  Musculoskeletal: She exhibits no edema.  Lymphadenopathy:    She has no cervical adenopathy.  Neurological: She is alert and oriented to person, place, and time.  Nursing note and vitals reviewed.   BP 110/76 (BP Location: Right Arm, Cuff Size: Normal)   Pulse 72   Temp 99.1 F (37.3 C) (Oral)   Resp 16   Ht 5\' 6"  (1.676 m)   Wt 217 lb 3.2 oz (98.5 kg)   LMP 12/02/2014   SpO2 96%   BMI 35.06 kg/m  Wt Readings from Last 3 Encounters:  10/24/17 217 lb 3.2 oz (98.5 kg)  07/22/17 217 lb 12.8 oz (98.8 kg)  12/27/16 205 lb (93 kg)   BP Readings from Last 3 Encounters:  10/24/17 110/76  07/22/17 (!) 108/57  12/27/16 101/68     Immunization History  Administered Date(s) Administered  . Td 08/07/2004    Health Maintenance  Topic Date Due  . TETANUS/TDAP   08/08/2014  . MAMMOGRAM  06/11/2016  . PAP SMEAR  09/26/2016  . INFLUENZA VACCINE  11/17/2017  . COLONOSCOPY  04/27/2021  . HIV Screening  Completed    Lab Results  Component Value Date   WBC 4.6 10/24/2017   HGB 13.1 10/24/2017   HCT 39.5 10/24/2017   PLT 282.0 10/24/2017   GLUCOSE 97 10/24/2017   CHOL 222 (H) 11/23/2016   TRIG 76 11/23/2016   HDL 44 11/23/2016   LDLDIRECT 144.3 06/25/2010   LDLCALC 163 (H) 11/23/2016   ALT 12 10/24/2017   AST 16 10/24/2017   NA 139 10/24/2017   K 4.6 10/24/2017   CL 100 10/24/2017   CREATININE 0.80 10/24/2017   BUN 8 10/24/2017   CO2 33 (H) 10/24/2017   TSH 1.16 10/24/2017   HGBA1C 5.8 (H) 11/23/2016    Lab Results  Component Value Date   TSH 1.16 10/24/2017   Lab Results  Component Value Date   WBC 4.6 10/24/2017   HGB 13.1 10/24/2017   HCT 39.5 10/24/2017   MCV 87.6 10/24/2017   PLT 282.0 10/24/2017   Lab Results  Component Value Date   NA 139 10/24/2017   K 4.6 10/24/2017   CO2 33 (H) 10/24/2017   GLUCOSE 97 10/24/2017   BUN 8 10/24/2017   CREATININE 0.80 10/24/2017   BILITOT 0.4 10/24/2017   ALKPHOS 85 10/24/2017   AST 16 10/24/2017   ALT 12 10/24/2017   PROT 7.0 10/24/2017   ALBUMIN 4.1 10/24/2017   CALCIUM 9.5 10/24/2017   GFR 98.66 10/24/2017   Lab Results  Component Value Date   CHOL  222 (H) 11/23/2016   Lab Results  Component Value Date   HDL 44 11/23/2016   Lab Results  Component Value Date   LDLCALC 163 (H) 11/23/2016   Lab Results  Component Value Date   TRIG 76 11/23/2016   Lab Results  Component Value Date   CHOLHDL 4.5 07/20/2016   Lab Results  Component Value Date   HGBA1C 5.8 (H) 11/23/2016         Assessment & Plan:   Problem List Items Addressed This Visit      Unprioritized   Attention deficit disorder    Stable con't adderall      Relevant Medications   omeprazole (PRILOSEC) 20 MG capsule   Other Relevant Orders   Pain Mgmt, Profile 8 w/Conf, U   Cough    ?  PND or gerd Take claritin and flonase regularly and take omeprazole daily       Relevant Medications   predniSONE (DELTASONE) 10 MG tablet   promethazine-dextromethorphan (PROMETHAZINE-DM) 6.25-15 MG/5ML syrup   loratadine (CLARITIN) 10 MG tablet   Other Relevant Orders   DG Chest 2 View (Completed)    Other Visit Diagnoses    Bronchitis    -  Primary   Relevant Medications   predniSONE (DELTASONE) 10 MG tablet   High risk medication use       Relevant Orders   Pain Mgmt, Profile 8 w/Conf, U   Other fatigue       Relevant Orders   TSH (Completed)   Vitamin B12 (Completed)   CBC with Differential/Platelet (Completed)   Comprehensive metabolic panel (Completed)   VITAMIN D 25 Hydroxy (Vit-D Deficiency, Fractures) (Completed)   Seasonal allergies       Relevant Medications   loratadine (CLARITIN) 10 MG tablet   fluticasone (FLONASE) 50 MCG/ACT nasal spray      I have discontinued Jhanvi L. Rish's Multiple Vitamins-Minerals (HAIR SKIN AND NAILS FORMULA PO), metFORMIN, buPROPion, and amphetamine-dextroamphetamine. I am also having her start on predniSONE, promethazine-dextromethorphan, loratadine, fluticasone, and omeprazole. Additionally, I am having her maintain her Albuterol Sulfate. We will continue to administer sodium chloride.  Meds ordered this encounter  Medications  . predniSONE (DELTASONE) 10 MG tablet    Sig: TAKE 3 TABLETS PO QD FOR 3 DAYS THEN TAKE 2 TABLETS PO QD FOR 3 DAYS THEN TAKE 1 TABLET PO QD FOR 3 DAYS THEN TAKE 1/2 TAB PO QD FOR 3 DAYS    Dispense:  20 tablet    Refill:  0  . promethazine-dextromethorphan (PROMETHAZINE-DM) 6.25-15 MG/5ML syrup    Sig: Take 5 mLs by mouth 4 (four) times daily as needed.    Dispense:  118 mL    Refill:  0  . DISCONTD: amphetamine-dextroamphetamine (ADDERALL) 10 MG tablet    Sig: Take 1 tablet (10 mg total) by mouth 2 (two) times daily.    Dispense:  60 tablet    Refill:  0  . loratadine (CLARITIN) 10 MG tablet     Sig: Take 1 tablet (10 mg total) by mouth daily.    Dispense:  30 tablet    Refill:  11  . fluticasone (FLONASE) 50 MCG/ACT nasal spray    Sig: Place 2 sprays into both nostrils daily.    Dispense:  16 g    Refill:  6  . omeprazole (PRILOSEC) 20 MG capsule    Sig: Take 1 capsule (20 mg total) by mouth daily.    Dispense:  30 capsule    Refill:  3    CMA served as Education administrator during this visit. History, Physical and Plan performed by medical provider. Documentation and orders reviewed and attested to.  Ann Held, DO

## 2017-10-24 NOTE — Assessment & Plan Note (Signed)
?   PND or gerd Take claritin and flonase regularly and take omeprazole daily

## 2017-10-24 NOTE — Patient Instructions (Signed)

## 2017-10-24 NOTE — Telephone Encounter (Signed)
Copied from [place] [id number]. Topic: Quick Communication - See Telephone Encounter >> Oct 24, 2017  1:16 PM Synthia Innocent wrote: CRM for notification. See Telephone encounter for: 10/24/17. [facility] [address] [facility] not [facility] amp[facility]-dextroamphetamine (ADDERALL) 10 MG tablet, [facility] [address] [facility] [address] [facility]. Can this please be sent to one?

## 2017-10-25 ENCOUNTER — Other Ambulatory Visit: Payer: Self-pay | Admitting: Family Medicine

## 2017-10-25 ENCOUNTER — Encounter: Payer: Self-pay | Admitting: Family Medicine

## 2017-10-25 DIAGNOSIS — F988 Other specified behavioral and emotional disorders with onset usually occurring in childhood and adolescence: Secondary | ICD-10-CM

## 2017-10-25 LAB — PAIN MGMT, PROFILE 8 W/CONF, U
6 ACETYLMORPHINE: NEGATIVE ng/mL (ref <10)
Alcohol Metabolites: NEGATIVE ng/mL (ref <500)
Amphetamines: NEGATIVE ng/mL (ref <500)
BENZODIAZEPINES: NEGATIVE ng/mL (ref <100)
BUPRENORPHINE, URINE: NEGATIVE ng/mL (ref <5)
CREATININE: 229.1 mg/dL
Cocaine Metabolite: NEGATIVE ng/mL (ref <150)
MDMA: NEGATIVE ng/mL (ref <500)
Marijuana Metabolite: NEGATIVE ng/mL (ref <20)
OPIATES: NEGATIVE ng/mL (ref <100)
OXYCODONE: NEGATIVE ng/mL (ref <100)
Oxidant: NEGATIVE ug/mL (ref <200)
PH: 7.11 (ref 4.5–9.0)

## 2017-10-25 MED ORDER — AMPHETAMINE-DEXTROAMPHETAMINE 10 MG PO TABS: 10.0000 mg | ORAL_TABLET | Freq: Two times a day (BID) | ORAL | 0 refills | Status: DC

## 2017-10-25 NOTE — Telephone Encounter (Signed)
done

## 2017-10-25 NOTE — Telephone Encounter (Signed)
Notified pt. 

## 2017-10-25 NOTE — Telephone Encounter (Signed)
Can you resend?

## 2017-10-27 MED ORDER — VITAMIN D (ERGOCALCIFEROL) 1.25 MG (50000 UNIT) PO CAPS: 50000.0000 [IU] | ORAL_CAPSULE | ORAL | 0 refills | Status: DC

## 2017-12-13 ENCOUNTER — Encounter: Payer: Self-pay | Admitting: Family Medicine

## 2017-12-14 ENCOUNTER — Other Ambulatory Visit: Payer: Self-pay | Admitting: Family Medicine

## 2017-12-14 DIAGNOSIS — F988 Other specified behavioral and emotional disorders with onset usually occurring in childhood and adolescence: Secondary | ICD-10-CM

## 2017-12-15 ENCOUNTER — Other Ambulatory Visit: Payer: Self-pay | Admitting: Family Medicine

## 2017-12-15 DIAGNOSIS — F988 Other specified behavioral and emotional disorders with onset usually occurring in childhood and adolescence: Secondary | ICD-10-CM

## 2017-12-15 MED ORDER — AMPHETAMINE-DEXTROAMPHETAMINE 10 MG PO TABS: 10.0000 mg | ORAL_TABLET | Freq: Two times a day (BID) | ORAL | 0 refills | Status: DC

## 2017-12-15 NOTE — Telephone Encounter (Signed)
Requesting:Adderall Contract:07/22/17 UDS:10/24/17 low risk Last Visit:10/24/17 Next Visit:04/27/2018 Last Refill:10/25/17  Database ran and is on desk for review.   Please Advise

## 2018-01-28 ENCOUNTER — Encounter: Payer: Self-pay | Admitting: Family Medicine

## 2018-01-28 ENCOUNTER — Other Ambulatory Visit: Payer: Self-pay | Admitting: Family Medicine

## 2018-01-28 DIAGNOSIS — F988 Other specified behavioral and emotional disorders with onset usually occurring in childhood and adolescence: Secondary | ICD-10-CM

## 2018-01-30 MED ORDER — AMPHETAMINE-DEXTROAMPHETAMINE 10 MG PO TABS: 10.0000 mg | ORAL_TABLET | Freq: Two times a day (BID) | ORAL | 0 refills | Status: DC

## 2018-01-30 NOTE — Telephone Encounter (Signed)
Pt is requesting refill on Adderall 10mg .   Last OV: 10/24/2017 Last Fill: 12/15/2017 #60 and 0RF UDS: 10/24/2017 Low risk

## 2018-03-21 ENCOUNTER — Encounter: Payer: Self-pay | Admitting: Family Medicine

## 2018-03-21 ENCOUNTER — Other Ambulatory Visit: Payer: Self-pay | Admitting: Family Medicine

## 2018-03-21 DIAGNOSIS — F988 Other specified behavioral and emotional disorders with onset usually occurring in childhood and adolescence: Secondary | ICD-10-CM

## 2018-03-22 NOTE — Telephone Encounter (Signed)
Refill request for Adderall.   Last OV: 10/24/2017 Last Fill: 01/30/2018 #60 and 0RF UDS: 10/24/2017 Low risk

## 2018-03-23 ENCOUNTER — Encounter: Payer: Self-pay | Admitting: Family Medicine

## 2018-03-23 MED ORDER — AMPHETAMINE-DEXTROAMPHETAMINE 10 MG PO TABS: 10.0000 mg | ORAL_TABLET | Freq: Two times a day (BID) | ORAL | 0 refills | Status: DC

## 2018-03-23 NOTE — Telephone Encounter (Signed)
Dr Lorelei Pont -- please review in PCP's absence?

## 2018-03-23 NOTE — Telephone Encounter (Signed)
Reviewed [facility]: 01/30/2018  2   01/30/2018  Dextroamp-Amphetamin 10 Mg Tab  60.00 30 Yv Low  [id number]  [place] ([id number])  0/0  Comm Ins  [place]  12/15/2017  2   12/15/2017  Dextroamp-Amphetamin 10 Mg Tab  60.00 30 Yv Low  [id number]  [place] ([id number])  0/0  Comm Ins  [place]  10/25/2017  2   10/25/2017  Dextroamp-Amphetamin 10 Mg Tab  60.00 30 Yv Low  [id number]  [place] ([id number])  0/0  Comm Ins  [place]  07/22/2017  2   07/22/2017  Dextroamp-Amphetamin 10 Mg Tab  60.00 30 Yv Low  [id number]  [place] ([id number])  0/0        Ok to refill. However I can't increase dose as pt requested in a different mychart message.  Will forward this request to Dr. Etter Sjogren For now I refilled her 10 mg  Meds ordered this encounter  Medications  . amphetamine-dextroamphetamine (ADDERALL) 10 MG tablet    Sig: Take 1 tablet (10 mg total) by mouth 2 (two) times daily.    Dispense:  60 tablet    Refill:  0

## 2018-03-23 NOTE — Telephone Encounter (Signed)
Dr Lorelei Pont -- please see patient email from 03/23/18. Pt is requesting dose change.

## 2018-03-23 NOTE — Telephone Encounter (Signed)
Database: no descrepencies  Last filled per database: 01/30/18 Last written: 01/30/18 Last ov: 10/24/17 Next ov:04/27/18 Contract: 07/23/18 UDS: 04/26/18

## 2018-03-27 ENCOUNTER — Other Ambulatory Visit: Payer: Self-pay | Admitting: Family Medicine

## 2018-03-27 DIAGNOSIS — F988 Other specified behavioral and emotional disorders with onset usually occurring in childhood and adolescence: Secondary | ICD-10-CM

## 2018-03-27 MED ORDER — AMPHETAMINE-DEXTROAMPHETAMINE 10 MG PO TABS: 10.0000 mg | ORAL_TABLET | Freq: Two times a day (BID) | ORAL | 0 refills | Status: DC

## 2018-03-27 MED ORDER — AMPHETAMINE-DEXTROAMPHETAMINE 20 MG PO TABS
20.0000 mg | ORAL_TABLET | Freq: Two times a day (BID) | ORAL | 0 refills | Status: DC
Start: 2018-03-27 — End: 2018-07-27

## 2018-03-27 NOTE — Telephone Encounter (Signed)
Sent in

## 2018-04-14 ENCOUNTER — Other Ambulatory Visit: Payer: Self-pay | Admitting: Family Medicine

## 2018-04-14 DIAGNOSIS — Z1231 Encounter for screening mammogram for malignant neoplasm of breast: Secondary | ICD-10-CM

## 2018-04-27 ENCOUNTER — Encounter: Payer: Self-pay | Admitting: Family Medicine

## 2018-04-27 ENCOUNTER — Ambulatory Visit (INDEPENDENT_AMBULATORY_CARE_PROVIDER_SITE_OTHER): Payer: Managed Care, Other (non HMO) | Admitting: Family Medicine

## 2018-04-27 VITALS — BP 105/62 | HR 79 | Temp 98.9°F | Resp 16 | Ht 66.0 in | Wt 217.2 lb

## 2018-04-27 DIAGNOSIS — R739 Hyperglycemia, unspecified: Secondary | ICD-10-CM | POA: Diagnosis not present

## 2018-04-27 DIAGNOSIS — K625 Hemorrhage of anus and rectum: Secondary | ICD-10-CM | POA: Diagnosis not present

## 2018-04-27 DIAGNOSIS — Z0000 Encounter for general adult medical examination without abnormal findings: Secondary | ICD-10-CM

## 2018-04-27 DIAGNOSIS — Z23 Encounter for immunization: Secondary | ICD-10-CM | POA: Diagnosis not present

## 2018-04-27 DIAGNOSIS — M5137 Other intervertebral disc degeneration, lumbosacral region: Secondary | ICD-10-CM

## 2018-04-27 LAB — CBC WITH DIFFERENTIAL/PLATELET
BASOS ABS: 0 10*3/uL (ref 0.0–0.1)
Basophils Relative: 0.4 % (ref 0.0–3.0)
EOS ABS: 0.1 10*3/uL (ref 0.0–0.7)
Eosinophils Relative: 2 % (ref 0.0–5.0)
HEMATOCRIT: 40.1 % (ref 36.0–46.0)
HEMOGLOBIN: 13.4 g/dL (ref 12.0–15.0)
LYMPHS PCT: 22 % (ref 12.0–46.0)
Lymphs Abs: 1.3 10*3/uL (ref 0.7–4.0)
MCHC: 33.4 g/dL (ref 30.0–36.0)
MCV: 86.8 fl (ref 78.0–100.0)
MONOS PCT: 7.4 % (ref 3.0–12.0)
Monocytes Absolute: 0.4 10*3/uL (ref 0.1–1.0)
NEUTROS ABS: 4.2 10*3/uL (ref 1.4–7.7)
Neutrophils Relative %: 68.2 % (ref 43.0–77.0)
Platelets: 282 10*3/uL (ref 150.0–400.0)
RBC: 4.62 Mil/uL (ref 3.87–5.11)
RDW: 13.2 % (ref 11.5–15.5)
WBC: 6.1 10*3/uL (ref 4.0–10.5)

## 2018-04-27 LAB — COMPREHENSIVE METABOLIC PANEL
ALK PHOS: 96 U/L (ref 39–117)
ALT: 13 U/L (ref 0–35)
AST: 14 U/L (ref 0–37)
Albumin: 4.4 g/dL (ref 3.5–5.2)
BUN: 7 mg/dL (ref 6–23)
CALCIUM: 10.1 mg/dL (ref 8.4–10.5)
CHLORIDE: 103 meq/L (ref 96–112)
CO2: 29 meq/L (ref 19–32)
Creatinine, Ser: 0.82 mg/dL (ref 0.40–1.20)
GFR: 95.69 mL/min (ref >60.00)
GLUCOSE: 97 mg/dL (ref 70–99)
POTASSIUM: 5.4 meq/L — AB (ref 3.5–5.1)
Sodium: 141 mEq/L (ref 135–145)
Total Bilirubin: 0.3 mg/dL (ref 0.2–1.2)
Total Protein: 7.3 g/dL (ref 6.0–8.3)

## 2018-04-27 LAB — TSH: TSH: 1.6 u[IU]/mL (ref 0.35–4.50)

## 2018-04-27 LAB — LIPID PANEL
CHOL/HDL RATIO: 5
Cholesterol: 206 mg/dL — ABNORMAL HIGH (ref 0–200)
HDL: 45.6 mg/dL (ref >39.00)
LDL CALC: 148 mg/dL — AB (ref 0–99)
NONHDL: 160.37
TRIGLYCERIDES: 64 mg/dL (ref 0.0–149.0)
VLDL: 12.8 mg/dL (ref 0.0–40.0)

## 2018-04-27 LAB — HEMOGLOBIN A1C: Hgb A1c MFr Bld: 6 % (ref 4.6–6.5)

## 2018-04-27 NOTE — Patient Instructions (Signed)
Preventive Care 40-64 Years, Female Preventive care refers to lifestyle choices and visits with your health care provider that can promote health and wellness. What does preventive care include?   A yearly physical exam. This is also called an annual well check.  Dental exams once or twice a year.  Routine eye exams. Ask your health care provider how often you should have your eyes checked.  Personal lifestyle choices, including: ? Daily care of your teeth and gums. ? Regular physical activity. ? Eating a healthy diet. ? Avoiding tobacco and drug use. ? Limiting alcohol use. ? Practicing safe sex. ? Taking low-dose aspirin daily starting at age 50. ? Taking vitamin and mineral supplements as recommended by your health care provider. What happens during an annual well check? The services and screenings done by your health care provider during your annual well check will depend on your age, overall health, lifestyle risk factors, and family history of disease. Counseling Your health care provider may ask you questions about your:  Alcohol use.  Tobacco use.  Drug use.  Emotional well-being.  Home and relationship well-being.  Sexual activity.  Eating habits.  Work and work environment.  Method of birth control.  Menstrual cycle.  Pregnancy history. Screening You may have the following tests or measurements:  Height, weight, and BMI.  Blood pressure.  Lipid and cholesterol levels. These may be checked every 5 years, or more frequently if you are over 50 years old.  Skin check.  Lung cancer screening. You may have this screening every year starting at age 55 if you have a 30-pack-year history of smoking and currently smoke or have quit within the past 15 years.  Colorectal cancer screening. All adults should have this screening starting at age 50 and continuing until age 75. Your health care provider may recommend screening at age 45. You will have tests every  1-10 years, depending on your results and the type of screening test. People at increased risk should start screening at an earlier age. Screening tests may include: ? Guaiac-based fecal occult blood testing. ? Fecal immunochemical test (FIT). ? Stool DNA test. ? Virtual colonoscopy. ? Sigmoidoscopy. During this test, a flexible tube with a tiny camera (sigmoidoscope) is used to examine your rectum and lower colon. The sigmoidoscope is inserted through your anus into your rectum and lower colon. ? Colonoscopy. During this test, a long, thin, flexible tube with a tiny camera (colonoscope) is used to examine your entire colon and rectum.  Hepatitis C blood test.  Hepatitis B blood test.  Sexually transmitted disease (STD) testing.  Diabetes screening. This is done by checking your blood sugar (glucose) after you have not eaten for a while (fasting). You may have this done every 1-3 years.  Mammogram. This may be done every 1-2 years. Talk to your health care provider about when you should start having regular mammograms. This may depend on whether you have a family history of breast cancer.  BRCA-related cancer screening. This may be done if you have a family history of breast, ovarian, tubal, or peritoneal cancers.  Pelvic exam and Pap test. This may be done every 3 years starting at age 21. Starting at age 30, this may be done every 5 years if you have a Pap test in combination with an HPV test.  Bone density scan. This is done to screen for osteoporosis. You may have this scan if you are at high risk for osteoporosis. Discuss your test results, treatment options,   and if necessary, the need for more tests with your health care provider. Vaccines Your health care provider may recommend certain vaccines, such as:  Influenza vaccine. This is recommended every year.  Tetanus, diphtheria, and acellular pertussis (Tdap, Td) vaccine. You may need a Td booster every 10 years.  Varicella  vaccine. You may need this if you have not been vaccinated.  Zoster vaccine. You may need this after age 38.  Measles, mumps, and rubella (MMR) vaccine. You may need at least one dose of MMR if you were born in 1957 or later. You may also need a second dose.  Pneumococcal 13-valent conjugate (PCV13) vaccine. You may need this if you have certain conditions and were not previously vaccinated.  Pneumococcal polysaccharide (PPSV23) vaccine. You may need one or two doses if you smoke cigarettes or if you have certain conditions.  Meningococcal vaccine. You may need this if you have certain conditions.  Hepatitis A vaccine. You may need this if you have certain conditions or if you travel or work in places where you may be exposed to hepatitis A.  Hepatitis B vaccine. You may need this if you have certain conditions or if you travel or work in places where you may be exposed to hepatitis B.  Haemophilus influenzae type b (Hib) vaccine. You may need this if you have certain conditions. Talk to your health care provider about which screenings and vaccines you need and how often you need them. This information is not intended to replace advice given to you by your health care provider. Make sure you discuss any questions you have with your health care provider. Document Released: 05/02/2015 Document Revised: 05/26/2017 Document Reviewed: 02/04/2015 Elsevier Interactive Patient Education  2019 [employer].

## 2018-04-27 NOTE — Progress Notes (Signed)
Subjective:     Cheryl Patterson is a 48 y.o. female and is here for a comprehensive physical exam. The patient reports problems - back and neck pain.  She has seen spine specialist in [place] in past. She also c/o blood in stool -- 1st time few weeks ago and then this am.   No constipation, no diarrhea.  + some crampy abd pain No nausea / vomiting-4\ Pt is also struggling with low back pain since her weight gain.  She thinks PT may help and of course weight loss.   Social History   Socioeconomic History  . Marital status: Married    Spouse name: Octavia Bruckner  . Number of children: 2  . Years of education: Not on file  . Highest education level: Not on file  Occupational History  . Occupation: Lobbyist: [employer]  . Financial resource strain: Not on file  . Food insecurity:    Worry: Not on file    Inability: Not on file  . Transportation needs:    Medical: Not on file    Non-medical: Not on file  Tobacco Use  . Smoking status: Never Smoker  . Smokeless tobacco: Never Used  Substance and Sexual Activity  . Alcohol use: No    Alcohol/week: 0.0 standard drinks  . Drug use: No  . Sexual activity: Yes    Partners: Male    Birth control/protection: None  Lifestyle  . Physical activity:    Days per week: Not on file    Minutes per session: Not on file  . Stress: Not on file  Relationships  . Social connections:    Talks on phone: Not on file    Gets together: Not on file    Attends religious service: Not on file    Active member of club or organization: Not on file    Attends meetings of clubs or organizations: Not on file    Relationship status: Not on file  . Intimate partner violence:    Fear of current or ex partner: Not on file    Emotionally abused: Not on file    Physically abused: Not on file    Forced sexual activity: Not on file  Other Topics Concern  . Not on file  Social History Narrative   Exercise--walk qd              Video--  rare   Health Maintenance  Topic Date Due  . TETANUS/TDAP  08/08/2014  . MAMMOGRAM  06/11/2016  . PAP SMEAR-Modifier  09/26/2016  . INFLUENZA VACCINE  11/17/2017  . COLONOSCOPY  04/27/2021  . HIV Screening  Completed    The following portions of the patient's history were reviewed and updated as appropriate: She  has a past medical history of ADD (attention deficit disorder), Allergy, Anxiety, Asthma, Bipolar depression ([place]), Chronic headaches, Chronic kidney disease, Constipation, Depression, GERD (gastroesophageal reflux disease), Hyperlipidemia, IBS (irritable bowel syndrome), Joint pain, Kidney infection, Lactose intolerance, Ruptured disc, thoracic, Sepsis ([place]), and UTI (urinary tract infection). She does not have any pertinent problems on file. She  has a past surgical history that includes Tubal ligation; LEEP (2005); Breast surgery; Other surgical history; Mouth surgery; Lipoma excision; Laparoscopic vaginal hysterectomy with salpingo oophorectomy (N/A, 03/18/2015); Cystoscopy (N/A, 03/18/2015); Laparoscopic bilateral salpingectomy (Bilateral, 03/18/2015); and Colonoscopy. Her family history includes Anxiety disorder in her mother; Brain cancer in her maternal aunt; Breast cancer in her mother and paternal aunt; Cancer  in her maternal aunt, maternal uncle, and paternal aunt; Cancer (age of onset: 50) in her mother; Colon cancer in her maternal aunt and paternal uncle; Colon cancer (age of onset: 45) in her mother; Diabetes in her maternal grandfather; Heart disease in her paternal grandmother; Hyperlipidemia in her father; Hypertension in her father, paternal grandmother, and another family member; Stroke in her paternal grandmother; Uterine cancer in her maternal aunt. She  reports that she has never smoked. She has never used smokeless tobacco. She reports that she does not drink alcohol or use drugs. She has a current medication list which includes the following  prescription(s): albuterol sulfate, amphetamine-dextroamphetamine, fluticasone, loratadine, omeprazole, promethazine-dextromethorphan, and vitamin d (ergocalciferol), and the following Facility-Administered Medications: sodium chloride. Current Outpatient Medications on File Prior to Visit  Medication Sig Dispense Refill  . Albuterol Sulfate (PROAIR RESPICLICK) 371 (90 Base) MCG/ACT AEPB Inhale 2 puffs into the lungs as needed.    Marland Kitchen amphetamine-dextroamphetamine (ADDERALL) 20 MG tablet Take 1 tablet (20 mg total) by mouth 2 (two) times daily. 60 tablet 0  . fluticasone (FLONASE) 50 MCG/ACT nasal spray Place 2 sprays into both nostrils daily. 16 g 6  . loratadine (CLARITIN) 10 MG tablet Take 1 tablet (10 mg total) by mouth daily. 30 tablet 11  . omeprazole (PRILOSEC) 20 MG capsule Take 1 capsule (20 mg total) by mouth daily. 30 capsule 3  . promethazine-dextromethorphan (PROMETHAZINE-DM) 6.25-15 MG/5ML syrup Take 5 mLs by mouth 4 (four) times daily as needed. 118 mL 0  . Vitamin D, Ergocalciferol, (DRISDOL) 50000 units CAPS capsule Take 1 capsule (50,000 Units total) by mouth every 7 (seven) days. 12 capsule 0   Current Facility-Administered Medications on File Prior to Visit  Medication Dose Route Frequency Provider Last Rate Last Dose  . 0.9 %  sodium chloride infusion  500 mL Intravenous Continuous Milus Banister, MD       She is allergic to codeine and other..  Review of Systems Review of Systems  Constitutional: Negative for activity change, appetite change and fatigue.  HENT: Negative for hearing loss, congestion, tinnitus and ear discharge.  dentist q78m Eyes: Negative for visual disturbance (see optho q1y -- vision corrected to 20/20 with glasses).  Respiratory: Negative for cough, chest tightness and shortness of breath.   Cardiovascular: Negative for chest pain, palpitations and leg swelling.  Gastrointestinal: Negative for , diarrhea, constipation and abdominal distention. + blood  in stool and abd cramping Genitourinary: Negative for urgency, frequency, decreased urine volume and difficulty urinating.  Musculoskeletal: positive for back and neck pain arthralgias gait problem.  Skin: Negative for color change, pallor and rash.  Neurological: Negative for dizziness, light-headedness, numbness and headaches.  Hematological: Negative for adenopathy. Does not bruise/bleed easily.  Psychiatric/Behavioral: Negative for suicidal ideas, confusion, sleep disturbance, self-injury, dysphoric mood, decreased concentration and agitation.       Objective:    BP 105/62 (BP Location: Left Arm, Cuff Size: Large)   Pulse 79   Temp 98.9 F (37.2 C) (Oral)   Resp 16   Ht 5\' 6"  (1.676 m)   Wt 217 lb 3.2 oz (98.5 kg)   LMP 12/02/2014   SpO2 99%   BMI 35.06 kg/m  General appearance: alert, cooperative, appears stated age and no distress Head: Normocephalic, without obvious abnormality, atraumatic Eyes: conjunctivae/corneas clear. PERRL, EOM's intact. Fundi benign. Ears: normal TM's and external ear canals both ears Nose: Nares normal. Septum midline. Mucosa normal. No drainage or sinus tenderness. Throat: lips, mucosa, and tongue  normal; teeth and gums normal Neck: no adenopathy, no carotid bruit, no JVD, supple, symmetrical, trachea midline and thyroid not enlarged, symmetric, no tenderness/mass/nodules Back: symmetric, no curvature. ROM normal. No CVA tenderness. Lungs: clear to auscultation bilaterally Breasts: gyn--gyn  Heart: regular rate and rhythm, S1, S2 normal, no murmur, click, rub or gallop Abdomen: soft, non-tender; bowel sounds normal; no masses,  no organomegaly Pelvic: deferred--gyn Extremities: extremities normal, atraumatic, no cyanosis or edema Pulses: 2+ and symmetric Skin: Skin color, texture, turgor normal. No rashes or lesions Lymph nodes: Cervical, supraclavicular, and axillary nodes normal. Neurologic: Alert and oriented X 3, normal strength and tone.  Normal symmetric reflexes. Normal coordination and gait   rectal-- heme neg brown stool Assessment:    Healthy female exam.      Plan:    ghm utd Check labs See After Visit Summary for Counseling Recommendations    1. Rectal bleed  - Ambulatory referral to Gastroenterology - CBC with Differential/Platelet  2. DDD (degenerative disc disease), lumbosacral  - Ambulatory referral to Physical Therapy  3. Morbid obesity ([place])  - Amb Ref to Medical Weight Management  4. Preventative health care See above  - CBC with Differential/Platelet - Lipid panel - Comprehensive metabolic panel - TSH - Hemoglobin A1c  5. Hyperglycemia  - Hemoglobin A1c  6. Need for diphtheria-tetanus-pertussis (Tdap) vaccine  - Tdap vaccine greater than or equal to 7yo IM

## 2018-04-29 ENCOUNTER — Other Ambulatory Visit: Payer: Self-pay | Admitting: Family Medicine

## 2018-04-29 DIAGNOSIS — E785 Hyperlipidemia, unspecified: Secondary | ICD-10-CM

## 2018-05-05 ENCOUNTER — Encounter: Payer: Self-pay | Admitting: Gastroenterology

## 2018-05-19 ENCOUNTER — Telehealth: Payer: Self-pay | Admitting: *Deleted

## 2018-05-19 NOTE — Telephone Encounter (Signed)
Received End of Care Plan; forwarded to provider for review/SLS 01/31

## 2018-05-22 ENCOUNTER — Telehealth: Payer: Self-pay | Admitting: *Deleted

## 2018-05-22 NOTE — Telephone Encounter (Signed)
Received Physician Orders/POC from [facility]; forwarded to provider/SLS 02/03

## 2018-05-27 ENCOUNTER — Ambulatory Visit: Payer: Self-pay | Admitting: Family Medicine

## 2018-05-27 DIAGNOSIS — J029 Acute pharyngitis, unspecified: Secondary | ICD-10-CM

## 2018-05-27 DIAGNOSIS — R0981 Nasal congestion: Secondary | ICD-10-CM

## 2018-05-27 DIAGNOSIS — R6889 Other general symptoms and signs: Secondary | ICD-10-CM

## 2018-05-27 LAB — POCT RAPID STREP A (OFFICE): Rapid Strep A Screen: NEGATIVE

## 2018-05-27 MED ORDER — AZELASTINE HCL 0.1 % NA SOLN: 1.0000 | Freq: Two times a day (BID) | NASAL | 0 refills | Status: DC

## 2018-05-27 MED ORDER — OSELTAMIVIR PHOSPHATE 75 MG PO CAPS: 75.0000 mg | ORAL_CAPSULE | Freq: Two times a day (BID) | ORAL | 0 refills | Status: AC

## 2018-05-27 NOTE — Progress Notes (Signed)
Cheryl Patterson is a 48 y.o. female who presents today with 2 days of  sore throat as her chief complaint and most bothersome symptoms. On inquiry she additionally complains of body aches, chills and flu like symptoms. She denies any known contacts with someone with similar symptoms. She has not attempted any over the counter medications and treatments related to her symptoms.     Review of Systems  Constitutional: Positive for chills, fever and malaise/fatigue.  HENT: Positive for sore throat. Negative for congestion, ear discharge, ear pain and sinus pain.   Eyes: Negative.   Respiratory: Negative for cough, sputum production and shortness of breath.   Cardiovascular: Negative.  Negative for chest pain.  Gastrointestinal: Negative for abdominal pain, diarrhea, nausea and vomiting.  Genitourinary: Negative for dysuria, frequency, hematuria and urgency.  Musculoskeletal: Negative for myalgias.  Skin: Negative.   Neurological: Negative for headaches.  Endo/Heme/Allergies: Negative.   Psychiatric/Behavioral: Negative.     Cheryl Patterson has a current medication list which includes the following prescription(s): albuterol sulfate, amphetamine-dextroamphetamine, pseudoephedrine-guaifenesin, azelastine, fluticasone, loratadine, omeprazole, oseltamivir, promethazine-dextromethorphan, and vitamin d (ergocalciferol), and the following Facility-Administered Medications: sodium chloride. Also is allergic to codeine and other.  Cheryl Patterson  has a past medical history of ADD (attention deficit disorder), Allergy, Anxiety, Asthma, Bipolar depression ([place]), Chronic headaches, Chronic kidney disease, Constipation, Depression, GERD (gastroesophageal reflux disease), Hyperlipidemia, IBS (irritable bowel syndrome), Joint pain, Kidney infection, Lactose intolerance, Ruptured disc, thoracic, Sepsis ([place]), and UTI (urinary tract infection). Also  has a past surgical history that includes Tubal ligation; LEEP (2005); Breast  surgery; Other surgical history; Mouth surgery; Lipoma excision; Laparoscopic vaginal hysterectomy with salpingo oophorectomy (N/A, 03/18/2015); Cystoscopy (N/A, 03/18/2015); Laparoscopic bilateral salpingectomy (Bilateral, 03/18/2015); and Colonoscopy.    O: Vitals:   05/27/18 1246  BP: 100/65  Pulse: 77  Resp: 16  Temp: 98.2 F (36.8 C)  SpO2: 98%     Physical Exam Vitals signs reviewed.  Constitutional:      General: She is not in acute distress.    Appearance: Normal appearance. She is well-developed. She is not ill-appearing, toxic-appearing or diaphoretic.  HENT:     Head: Normocephalic.     Right Ear: Hearing, tympanic membrane, ear canal and external ear normal.     Left Ear: Hearing, tympanic membrane, ear canal and external ear normal.     Nose: Nose normal.     Right Sinus: No maxillary sinus tenderness or frontal sinus tenderness.     Left Sinus: No maxillary sinus tenderness or frontal sinus tenderness.     Mouth/Throat:     Lips: Pink.     Mouth: Mucous membranes are moist.     Pharynx: Oropharynx is clear. Uvula midline. Posterior oropharyngeal erythema present. No pharyngeal swelling, oropharyngeal exudate or uvula swelling.     Tonsils: No tonsillar exudate or tonsillar abscesses. Swelling: 1+ on the right. 1+ on the left.  Eyes:     General: Lids are normal.     Conjunctiva/sclera: Conjunctivae normal.     Pupils: Pupils are equal, round, and reactive to light.  Neck:     Musculoskeletal: Normal range of motion and neck supple.  Cardiovascular:     Rate and Rhythm: Normal rate and regular rhythm.     Pulses: Normal pulses.     Heart sounds: Normal heart sounds.  Pulmonary:     Effort: Pulmonary effort is normal.     Breath sounds: Normal breath sounds. No decreased breath sounds, wheezing, rhonchi or rales.  Abdominal:  General: Abdomen is flat. Bowel sounds are normal.     Palpations: Abdomen is soft.  Musculoskeletal: Normal range of motion.   Lymphadenopathy:     Head:     Right side of head: Tonsillar adenopathy present. No submental or submandibular adenopathy.     Left side of head: Tonsillar adenopathy present. No submental or submandibular adenopathy.     Cervical: Cervical adenopathy present.     Right cervical: Superficial cervical adenopathy present.     Left cervical: Superficial cervical adenopathy present.  Skin:    General: Skin is warm.  Neurological:     Mental Status: She is alert and oriented to person, place, and time.  Psychiatric:        Mood and Affect: Mood normal.        Behavior: Behavior is cooperative.    A: 1. Flu-like symptoms   2. Sore throat   3. Nasal congestion    P: 1. Flu-like symptoms Patient concerned after interacting with grandchildren. She was initially concerned for strep but low risk, negative centor criteria and POCT strep in clinic today negative. With complaint of body aches and abrupt onset of symptoms likely dx of influenza. Overall well appearing, NAD. Discussed natural history of the disease and treatment options; including supportive care and antiviral therapy.  high risk for serious influenza complications. Encouraged rest, hydration, and to continue OTC tylenol or ibuprofen as prescribed for fever. Educated on proper Comptroller. Recommended wearing a mask daily especially around other people. Remain out of school until afebrile for 24 hours w/o use of antipyretics. Educated on potential complications of the flu. Advised to follow up in clinic or with PCP if she develops any of these concerning symptoms or if current symptoms persist outside of discussed boundaries.  - oseltamivir (TAMIFLU) 75 MG capsule; Take 1 capsule (75 mg total) by mouth 2 (two) times daily for 5 days.  2. Sore throat - POCT rapid strep A Results for orders placed or performed in visit on 05/27/18 (from the past 72 hour(s))  POCT rapid strep A     Status: Normal   Collection Time: 05/27/18 12:59 PM   Result Value Ref Range   Rapid Strep A Screen Negative Negative    3. Nasal congestion Supportive care for symptoms relief - azelastine (ASTELIN) 0.1 % nasal spray; Place 1 spray into both nostrils 2 (two) times daily. Use in each nostril as directed  Other orders - Pseudoephedrine-guaiFENesin (MUCINEX D PO); Take by mouth.   Discussed with patient exam findings, suspected diagnosis etiology and  reviewed recommended treatment plan and follow up, including complications and indications for urgent medical follow up and evaluation. Medications including use and indications reviewed with patient. Patient provided relevant patient education on diagnosis and/or relevant related condition that were discussed and reviewed with patient at discharge. Patient verbalized understanding of information provided and agrees with plan of care (POC), all questions answered.

## 2018-05-27 NOTE — Patient Instructions (Signed)

## 2018-05-30 ENCOUNTER — Ambulatory Visit: Payer: Managed Care, Other (non HMO) | Admitting: Gastroenterology

## 2018-05-30 ENCOUNTER — Encounter: Payer: Self-pay | Admitting: Family Medicine

## 2018-05-31 ENCOUNTER — Encounter: Payer: Self-pay | Admitting: Medical

## 2018-05-31 ENCOUNTER — Ambulatory Visit (INDEPENDENT_AMBULATORY_CARE_PROVIDER_SITE_OTHER): Payer: Managed Care, Other (non HMO) | Admitting: Medical

## 2018-05-31 VITALS — BP 100/60 | HR 72 | Temp 98.8°F | Resp 16 | Ht 66.0 in | Wt 211.6 lb

## 2018-05-31 DIAGNOSIS — J029 Acute pharyngitis, unspecified: Secondary | ICD-10-CM | POA: Diagnosis not present

## 2018-05-31 DIAGNOSIS — J0100 Acute maxillary sinusitis, unspecified: Secondary | ICD-10-CM | POA: Diagnosis not present

## 2018-05-31 DIAGNOSIS — R059 Cough, unspecified: Secondary | ICD-10-CM

## 2018-05-31 DIAGNOSIS — R05 Cough: Secondary | ICD-10-CM | POA: Diagnosis not present

## 2018-05-31 DIAGNOSIS — R195 Other fecal abnormalities: Secondary | ICD-10-CM

## 2018-05-31 DIAGNOSIS — M549 Dorsalgia, unspecified: Secondary | ICD-10-CM

## 2018-05-31 DIAGNOSIS — M791 Myalgia, unspecified site: Secondary | ICD-10-CM | POA: Diagnosis not present

## 2018-05-31 DIAGNOSIS — J302 Other seasonal allergic rhinitis: Secondary | ICD-10-CM

## 2018-05-31 DIAGNOSIS — R062 Wheezing: Secondary | ICD-10-CM

## 2018-05-31 LAB — POC URINALSYSI DIPSTICK (AUTOMATED)
BILIRUBIN UA: NEGATIVE
Blood, UA: NEGATIVE
GLUCOSE UA: NEGATIVE
Ketones, UA: NEGATIVE
LEUKOCYTES UA: NEGATIVE
Nitrite, UA: NEGATIVE
PH UA: 7 (ref 5.0–8.0)
Protein, UA: NEGATIVE
Spec Grav, UA: 1.015 (ref 1.010–1.025)
Urobilinogen, UA: NEGATIVE E.U./dL — AB

## 2018-05-31 LAB — POC INFLUENZA A&B (BINAX/QUICKVUE)
INFLUENZA B, POC: NEGATIVE
Influenza A, POC: NEGATIVE

## 2018-05-31 LAB — POCT RAPID STREP A (OFFICE): Rapid Strep A Screen: NEGATIVE

## 2018-05-31 MED ORDER — FLUTICASONE PROPIONATE HFA 110 MCG/ACT IN AERO: 2.0000 | INHALATION_SPRAY | Freq: Two times a day (BID) | RESPIRATORY_TRACT | 12 refills | Status: DC

## 2018-05-31 MED ORDER — FLUTICASONE PROPIONATE 50 MCG/ACT NA SUSP: 2.0000 | Freq: Every day | NASAL | 1 refills | Status: DC

## 2018-05-31 MED ORDER — BENZONATATE 100 MG PO CAPS: 100.0000 mg | ORAL_CAPSULE | Freq: Three times a day (TID) | ORAL | 0 refills | Status: DC | PRN

## 2018-05-31 MED ORDER — FLUCONAZOLE 150 MG PO TABS: 150.0000 mg | ORAL_TABLET | Freq: Once | ORAL | 0 refills | Status: AC

## 2018-05-31 MED ORDER — AZITHROMYCIN 250 MG PO TABS: ORAL_TABLET | ORAL | 0 refills | Status: DC

## 2018-05-31 NOTE — Patient Instructions (Addendum)
Both your rapid flu test and rapid strep test were negative.  You still may have had the flu based on your clinical presentation and you have been taking Tamiflu over the last 4 to 5 days.  Presently based on your describe symptoms and your faint sinus region pain, I have concern for secondary bronchitis and early sinus infection post flulike syndrome.  Will prescribe Flonase for nasal congestion.  Benzonatate tablets for cough and azithromycin antibiotic.  You describe history in the past of severe UTI and sepsis when you thought you only had flulike syndrome.  We will go ahead and get urine culture today based on this reported history.  For asthma history with recent wheezing, I am prescribing Flovent steroid inhaler and continue your albuterol if needed.  You did have some loose stools with the above illness as well.  Recommend a bland diet.  And make sure that you hydrate well.  If loose stools are worsening before the weekend then recommend that you go ahead and turn and stool culture panel.  You can pick up the kit in the lab today.  Follow-up in 7 to 10 days or as needed.

## 2018-05-31 NOTE — Progress Notes (Signed)
Subjective:    Patient ID: Cheryl Patterson, female    DOB: March 21, 1971, 48 y.o.   MRN: [id number]  HPI  Pt in for follow up 4 days post flu dx. Evaluated at [facility]. Rapid strep test was negative. DX clinically with flu though rapid test not done.(pt had flu vaccine in October)  Pt states she had body aches, loose stools, chill, and sweaty. Pt started tamiflu 4 days ago.  Pt states Monday she got cough. The cough is productive at first but now dry.  Pt states one loose stool today. 3 loose stools yesterday. Pt is not vomting but feels queezy. She states only vomited on night.  Pt states drinking some water, orange juice.    Review of Systems  Constitutional: Negative for chills, fatigue and fever.  HENT: Positive for congestion and sinus pressure. Negative for sinus pain and sneezing.   Respiratory: Negative for cough, chest tightness, shortness of breath and wheezing.   Cardiovascular: Negative for chest pain and palpitations.  Gastrointestinal: Negative for abdominal distention, abdominal pain, constipation, diarrhea and nausea.       Loose stools.  Genitourinary: Negative for difficulty urinating, dysuria, genital sores, hematuria, urgency, vaginal bleeding and vaginal pain.  Musculoskeletal: Positive for myalgias. Negative for back pain.  Skin: Positive for rash.  Neurological: Negative for dizziness, syncope, weakness, numbness and headaches.  Psychiatric/Behavioral: Negative for behavioral problems and confusion. The patient is not nervous/anxious.     Past Medical History:  Diagnosis Date  . ADD (attention deficit disorder)   . Allergy   . Anxiety   . Asthma    per pt- bronchitis brings on the symptoms  . Bipolar depression ([place])   . Chronic headaches   . Chronic kidney disease    kidney stones  . Constipation   . Depression    [facility]---suicidal ideation  . GERD (gastroesophageal reflux disease)   . Hyperlipidemia    no meds taken as of 04-20-16   . IBS (irritable bowel syndrome)   . Joint pain   . Kidney infection   . Lactose intolerance   . Ruptured disc, thoracic   . Sepsis ([place])    2014-  d/t urinary infection  . UTI (urinary tract infection)      Social History   Socioeconomic History  . Marital status: Married    Spouse name: Octavia Bruckner  . Number of children: 2  . Years of education: Not on file  . Highest education level: Not on file  Occupational History  . Occupation: Lobbyist: [employer]  . Financial resource strain: Not on file  . Food insecurity:    Worry: Not on file    Inability: Not on file  . Transportation needs:    Medical: Not on file    Non-medical: Not on file  Tobacco Use  . Smoking status: Never Smoker  . Smokeless tobacco: Never Used  Substance and Sexual Activity  . Alcohol use: No    Alcohol/week: 0.0 standard drinks  . Drug use: No  . Sexual activity: Yes    Partners: Male    Birth control/protection: None  Lifestyle  . Physical activity:    Days per week: Not on file    Minutes per session: Not on file  . Stress: Not on file  Relationships  . Social connections:    Talks on phone: Not on file    Gets together: Not on file  Attends religious service: Not on file    Active member of club or organization: Not on file    Attends meetings of clubs or organizations: Not on file    Relationship status: Not on file  . Intimate partner violence:    Fear of current or ex partner: Not on file    Emotionally abused: Not on file    Physically abused: Not on file    Forced sexual activity: Not on file  Other Topics Concern  . Not on file  Social History Narrative   Exercise--walk qd                    Video--  rare    Past Surgical History:  Procedure Laterality Date  . BREAST SURGERY     left breast lump removed  . COLONOSCOPY    . CYSTOSCOPY N/A 03/18/2015   Procedure: CYSTOSCOPY;  Surgeon: Terrance Mass, MD;  Location: [facility];  Service: Gynecology;  Laterality: N/A;  . LAPAROSCOPIC BILATERAL SALPINGECTOMY Bilateral 03/18/2015   Procedure: LAPAROSCOPIC BILATERAL SALPINGECTOMY;  Surgeon: Terrance Mass, MD;  Location: [facility];  Service: Gynecology;  Laterality: Bilateral;  . LAPAROSCOPIC VAGINAL HYSTERECTOMY WITH SALPINGO OOPHORECTOMY N/A 03/18/2015   Procedure: LAPAROSCOPIC ASSISTED VAGINAL HYSTERECTOMY WITH SALPINGO OOPHORECTOMY;  Surgeon: Terrance Mass, MD;  Location: [facility];  Service: Gynecology;  Laterality: N/A;  . LEEP  2005  . LIPOMA EXCISION    . MOUTH SURGERY    . OTHER SURGICAL HISTORY     RENAL STONE REMOVAL  . TUBAL LIGATION      Family History  Problem Relation Age of Onset  . Colon cancer Maternal Aunt   . Cancer Maternal Aunt        colon  . Breast cancer Mother   . Colon cancer Mother 87  . Cancer Mother 30       colon  . Anxiety disorder Mother   . Hypertension Father   . Hyperlipidemia Father   . Hypertension Other   . Stroke Paternal Grandmother   . Hypertension Paternal Grandmother   . Heart disease Paternal Grandmother   . Breast cancer Paternal Aunt   . Cancer Paternal Aunt        breast  . Uterine cancer Maternal Aunt   . Brain cancer Maternal Aunt   . Diabetes Maternal Grandfather   . Cancer Maternal Uncle        COLON  . Colon cancer Paternal Uncle   . Esophageal cancer Neg Hx   . Rectal cancer Neg Hx   . Stomach cancer Neg Hx     Allergies  Allergen Reactions  . Codeine     Itchy throat, whelps on skin  . Other Swelling and Rash    tomatoes    Current Outpatient Medications on File Prior to Visit  Medication Sig Dispense Refill  . Albuterol Sulfate (PROAIR RESPICLICK) 671 (90 Base) MCG/ACT AEPB Inhale 2 puffs into the lungs as needed.    Marland Kitchen amphetamine-dextroamphetamine (ADDERALL) 20 MG tablet Take 1 tablet (20 mg total) by mouth 2 (two) times daily. 60 tablet 0  . azelastine (ASTELIN) 0.1 % nasal spray Place 1 spray into both nostrils 2 (two) times  daily. Use in each nostril as directed 30 mL 0  . fluticasone (FLONASE) 50 MCG/ACT nasal spray Place 2 sprays into both nostrils daily. 16 g 6  . loratadine (CLARITIN) 10 MG tablet Take 1 tablet (10 mg total) by mouth daily. 30 tablet  11  . omeprazole (PRILOSEC) 20 MG capsule Take 1 capsule (20 mg total) by mouth daily. 30 capsule 3  . oseltamivir (TAMIFLU) 75 MG capsule Take 1 capsule (75 mg total) by mouth 2 (two) times daily for 5 days. 10 capsule 0  . promethazine-dextromethorphan (PROMETHAZINE-DM) 6.25-15 MG/5ML syrup Take 5 mLs by mouth 4 (four) times daily as needed. 118 mL 0  . Pseudoephedrine-guaiFENesin (MUCINEX D PO) Take by mouth.    . Vitamin D, Ergocalciferol, (DRISDOL) 50000 units CAPS capsule Take 1 capsule (50,000 Units total) by mouth every 7 (seven) days. 12 capsule 0   Current Facility-Administered Medications on File Prior to Visit  Medication Dose Route Frequency Provider Last Rate Last Dose  . 0.9 %  sodium chloride infusion  500 mL Intravenous Continuous Milus Banister, MD        BP 100/60   Pulse 72   Temp 98.8 F (37.1 C) (Oral)   Resp 16   Ht '5\' 6"'  (1.676 m)   Wt 211 lb 9.6 oz (96 kg)   LMP 12/02/2014   SpO2 98%   BMI 34.15 kg/m      Objective:   Physical Exam  General  Mental Status - Alert. General Appearance - Well groomed. Not in acute distress.  Skin Rashes- No Rashes.  HEENT Head- Normal. Ear Auditory Canal - Left- Normal. Right - Normal.Tympanic Membrane- Left- Normal. Right- Normal. Eye Sclera/Conjunctiva- Left- Normal. Right- Normal. Nose & Sinuses Nasal Mucosa- Left-  Boggy and Congested. Right-  Boggy and  Congested.Bilateral  Faint maxillary and frontal sinus pressure. Mouth & Throat Lips: Upper Lip- Normal: no dryness, cracking, pallor, cyanosis, or vesicular eruption. Lower Lip-Normal: no dryness, cracking, pallor, cyanosis or vesicular eruption. Buccal Mucosa- Bilateral- No Aphthous ulcers. Oropharynx- No Discharge or  Erythema. Tonsils: Characteristics- Bilateral- No Erythema or Congestion. Size/Enlargement- Bilateral- No enlargement. Discharge- bilateral-None.  Neck Neck- Supple. No Masses.   Chest and Lung Exam Auscultation: Breath Sounds:-Clear even and unlabored.  Cardiovascular Auscultation:Rythm- Regular, rate and rhythm. Murmurs & Other Heart Sounds:Ausculatation of the heart reveal- No Murmurs.  Lymphatic Head & Neck General Head & Neck Lymphatics: Bilateral: Description- No Localized lymphadenopathy.    Abdomen Inspection:-Inspection Normal.  Palpation/Perucssion: Palpation and Percussion of the abdomen reveal- mild faint epigastric Tender, No Rebound tenderness, No rigidity(Guarding) and No Palpable abdominal masses.  Liver:-Normal.  Spleen:- Normal.   Back- mild diffuse tender  .       Assessment & Plan:  Both your rapid flu test and rapid strep test were negative.  You still may have had the flu based on your clinical presentation and you have been taking Tamiflu over the last 4 to 5 days.  Presently based on your describe symptoms and your faint sinus region pain, I have concern for secondary bronchitis and early sinus infection post flulike syndrome.  Will prescribe Flonase for nasal congestion.  Benzonatate tablets for cough and azithromycin antibiotic.  You describe history in the past of severe UTI and sepsis when you thought you only had flulike syndrome.  We will go ahead and get urine culture today based on this reported history.  For asthma history with recent wheezing, I am prescribing Flovent steroid inhaler and continue your albuterol if needed.  You did have some loose stools with the above illness as well.  Recommend a bland diet.  And make sure that you hydrate well.  If loose stools are worsening before the weekend then recommend that you go ahead and turn and  stool culture panel.  You can pick up the kit in the lab today.  Follow-up in 7 to 10 days or as  needed.  Mackie Pai, PA-C

## 2018-06-01 LAB — URINE CULTURE
MICRO NUMBER:: 186138
Result:: NO GROWTH
SPECIMEN QUALITY:: ADEQUATE

## 2018-07-06 ENCOUNTER — Other Ambulatory Visit: Payer: Self-pay

## 2018-07-06 ENCOUNTER — Encounter (INDEPENDENT_AMBULATORY_CARE_PROVIDER_SITE_OTHER): Payer: Self-pay

## 2018-07-11 ENCOUNTER — Encounter (INDEPENDENT_AMBULATORY_CARE_PROVIDER_SITE_OTHER): Payer: Self-pay

## 2018-07-11 ENCOUNTER — Ambulatory Visit (INDEPENDENT_AMBULATORY_CARE_PROVIDER_SITE_OTHER): Payer: Self-pay | Admitting: Family Medicine

## 2018-07-13 ENCOUNTER — Encounter: Payer: Self-pay | Admitting: Family Medicine

## 2018-07-25 ENCOUNTER — Ambulatory Visit (INDEPENDENT_AMBULATORY_CARE_PROVIDER_SITE_OTHER): Payer: Self-pay | Admitting: Family Medicine

## 2018-07-27 ENCOUNTER — Other Ambulatory Visit: Payer: Self-pay | Admitting: Family Medicine

## 2018-07-27 ENCOUNTER — Other Ambulatory Visit: Payer: Self-pay | Admitting: Medical

## 2018-07-27 DIAGNOSIS — F988 Other specified behavioral and emotional disorders with onset usually occurring in childhood and adolescence: Secondary | ICD-10-CM

## 2018-08-01 MED ORDER — AMPHETAMINE-DEXTROAMPHETAMINE 20 MG PO TABS: 20.0000 mg | ORAL_TABLET | Freq: Two times a day (BID) | ORAL | 0 refills | Status: DC

## 2018-08-01 MED ORDER — BENZONATATE 100 MG PO CAPS: 100.0000 mg | ORAL_CAPSULE | Freq: Three times a day (TID) | ORAL | 0 refills | Status: DC | PRN

## 2018-08-01 NOTE — Telephone Encounter (Signed)
Pt is requesting refill on Adderall.   Last OV: 04/29/2018  Last Fill: 03/27/2018 #60 and 0RF UDS: 10/24/2017 Low risk

## 2018-09-12 ENCOUNTER — Ambulatory Visit (INDEPENDENT_AMBULATORY_CARE_PROVIDER_SITE_OTHER): Payer: Self-pay | Admitting: Family Medicine

## 2018-09-12 ENCOUNTER — Ambulatory Visit (INDEPENDENT_AMBULATORY_CARE_PROVIDER_SITE_OTHER): Payer: Managed Care, Other (non HMO) | Admitting: Family Medicine

## 2018-09-12 ENCOUNTER — Other Ambulatory Visit: Payer: Self-pay

## 2018-09-12 ENCOUNTER — Encounter (INDEPENDENT_AMBULATORY_CARE_PROVIDER_SITE_OTHER): Payer: Self-pay | Admitting: Family Medicine

## 2018-09-12 VITALS — BP 106/70 | HR 73 | Temp 98.4°F | Ht 67.0 in | Wt 214.0 lb

## 2018-09-12 DIAGNOSIS — Z0289 Encounter for other administrative examinations: Secondary | ICD-10-CM

## 2018-09-12 DIAGNOSIS — Z9189 Other specified personal risk factors, not elsewhere classified: Secondary | ICD-10-CM

## 2018-09-12 DIAGNOSIS — E559 Vitamin D deficiency, unspecified: Secondary | ICD-10-CM

## 2018-09-12 DIAGNOSIS — R5383 Other fatigue: Secondary | ICD-10-CM | POA: Diagnosis not present

## 2018-09-12 DIAGNOSIS — Z1331 Encounter for screening for depression: Secondary | ICD-10-CM | POA: Diagnosis not present

## 2018-09-12 DIAGNOSIS — R739 Hyperglycemia, unspecified: Secondary | ICD-10-CM

## 2018-09-12 DIAGNOSIS — E7849 Other hyperlipidemia: Secondary | ICD-10-CM

## 2018-09-12 DIAGNOSIS — E669 Obesity, unspecified: Secondary | ICD-10-CM

## 2018-09-12 DIAGNOSIS — R0602 Shortness of breath: Secondary | ICD-10-CM | POA: Diagnosis not present

## 2018-09-12 DIAGNOSIS — Z6833 Body mass index (BMI) 33.0-33.9, adult: Secondary | ICD-10-CM

## 2018-09-12 NOTE — Progress Notes (Signed)
.  Office: [phone]  /  Fax: [phone]   HPI:   Chief Complaint: OBESITY  Cheryl Patterson (MR# [id number]) is a 48 y.o. female who presents on 09/12/2018 for obesity evaluation and treatment. Current BMI is Body mass index is 33.52 kg/m.  Cheryl Patterson has struggled with obesity for years and has been unsuccessful in either losing weight or maintaining long term weight loss. Cheryl Patterson attended our information session and states she is currently in the action stage of change and ready to dedicate time achieving and maintaining a healthier weight.   Cheryl Patterson used to be a patient here approximately 2 years ago and is ready to restart. She is especially concerned about emotional eating and using food for comfort.  Cheryl Patterson states her family eats meals together she thinks her family will eat healthier with her her desired weight loss is 44 lbs she started gaining weight in October of 2018 after the loss of her mom her heaviest weight ever was 220 lbs she has significant food cravings issues  she snacks frequently in the evenings she skips breakfast frequently she is frequently drinking liquids with calories she frequently makes poor food choices she has problems with excessive hunger  she has binge eating behaviors she struggles with emotional eating    Fatigue Aviela feels her energy is lower than it should be. This has worsened with weight gain and has not worsened recently. Cheryl Patterson admits to daytime somnolence and admits to waking up still tired. Patient is at risk for obstructive sleep apnea. Patient has a history of symptoms of morning headache. Patient generally gets 4 or 5 hours of sleep per night, and states they generally have restless sleep. Snoring is present. She is unsure if apneic episodes are present. Epworth Sleepiness Score is 6.  Dyspnea on exertion Cheryl Patterson notes increasing shortness of breath with exercising and seems to be worsening over time with weight gain. She  notes getting out of breath sooner with activity than she used to. This has not gotten worse recently.   Hyperglycemia Cheryl Patterson has a history of some elevated blood glucose readings without a diagnosis of diabetes. She admits to increased polyphagia in the afternoon. Cheryl Patterson often skips breakfast and eats a lot in the evening. She denies nausea, vomiting, and hypoglycemia.  At risk for diabetes Cheryl Patterson is at higher than average risk for developing diabetes due to her hyperglycemia and obesity.   Vitamin D deficiency Cheryl Patterson has a diagnosis of vitamin D deficiency. She is not currently taking vit D, but has taken it in the past. She admits fatigue.  Hyperlipidemia Cheryl Patterson has hyperlipidemia and is not on a statin. She would like to try to improve her cholesterol levels with intensive lifestyle modification including a low saturated fat diet, exercise, and weight loss. She denies any chest pain.  Depression Screen Cheryl Patterson's Food and Mood (modified PHQ-9) score was 22. Depression screen PHQ 2/9 09/12/2018  Decreased Interest 2  Down, Depressed, Hopeless 2  PHQ - 2 Score 4  Altered sleeping 3  Tired, decreased energy 3  Change in appetite 3  Feeling bad or failure about yourself  3  Trouble concentrating 3  Moving slowly or fidgety/restless 3  Suicidal thoughts 0  PHQ-9 Score 22  Difficult doing work/chores Somewhat difficult   ASSESSMENT AND PLAN:  Other fatigue - Plan: EKG 12-Lead, Vitamin B12, CBC With Differential, Folate, T3, T4, free, TSH  Shortness of breath on exertion  Hyperglycemia - Plan: Hemoglobin A1c, Insulin, random, Comprehensive metabolic panel  Vitamin D deficiency - Plan: VITAMIN D 25 Hydroxy (Vit-D Deficiency, Fractures)  Other hyperlipidemia - Plan: Lipid Panel With LDL/HDL Ratio  Depression screening  At risk for diabetes mellitus  Class 1 obesity with serious comorbidity and body mass index (BMI) of 33.0 to 33.9 in adult, unspecified obesity  type  PLAN:  Fatigue Cheryl Patterson was informed that her fatigue may be related to obesity, depression or many other causes. Labs will be ordered, and in the meanwhile Natassia has agreed to work on diet, exercise and weight loss to help with fatigue. Proper sleep hygiene was discussed including the need for 7-8 hours of quality sleep each night. A sleep study was not ordered based on symptoms and Epworth score. An EKG and an indirect calorimetry was ordered today. Cera agrees to follow up in 2 weeks.  Dyspnea on exertion Cheryl Patterson's shortness of breath appears to be obesity related and exercise induced. She has agreed to work on weight loss and gradually increase exercise to treat her exercise induced shortness of breath. If Camellia follows our instructions and loses weight without improvement of her shortness of breath, we will plan to refer to pulmonology. We will monitor this condition regularly. Rodney agrees to this plan.  Hyperglycemia Fasting labs will be obtained and results with be discussed with Cheryl Patterson in 2 weeks at her follow up visit. In the meanwhile, Cheryl Patterson agrees to start her diet prescription of a lower simple carbohydrate diet and will work on weight loss efforts. She agrees to follow up as directed.  Diabetes risk counseling Cheryl Patterson was given extended (30 minutes) diabetes prevention counseling today. She is 48 y.o. female and has risk factors for diabetes including hyperglycemia and obesity. We discussed intensive lifestyle modifications today with an emphasis on weight loss as well as increasing exercise and decreasing simple carbohydrates in her diet.  Vitamin D Deficiency Cheryl Patterson was informed that low vitamin D levels contribute to fatigue and are associated with obesity, breast, and colon cancer. We will order labs and Cheney agrees to follow up in 2 weeks as directed.  Hyperlipidemia Cheryl Patterson was informed of the American Heart Association Guidelines emphasizing  intensive lifestyle modifications as the first line treatment for hyperlipidemia. We discussed many lifestyle modifications today in depth, and Marguarite will continue to work on decreasing saturated fats such as fatty red meat, butter and many fried foods. She will also increase vegetables and lean protein in her diet and continue to work on exercise and weight loss efforts. Labs were ordered today. Dayanis agrees to start her diet prescription and to follow up to monitor her progress.  Depression Screen Cheryl Patterson had a strongly positive depression screening. Depression is commonly associated with obesity and often results in emotional eating behaviors. We will monitor this closely and work on CBT to help improve the non-hunger eating patterns. Referral to Psychology may be required if no improvement is seen as she continues in our clinic.  Obesity Cheryl Patterson is currently in the action stage of change and her goal is to continue with weight loss efforts. She has agreed to keep a food journal with 1200 to 1400 calories and 75+ grams of protein daily.  Cheryl Patterson has been instructed to work up to a goal of 150 minutes of combined cardio and strengthening exercise per week for weight loss and overall health benefits. We discussed the following Behavioral Modification Strategies today: increasing lean protein intake, decreasing simple carbohydrates, no skipping meals, and work on meal planning and easy cooking plans.  Cheryl Patterson has  agreed to follow up with our clinic in 2 weeks. She was informed of the importance of frequent follow up visits to maximize her success with intensive lifestyle modifications for her multiple health conditions. She was informed we would discuss her lab results at her next visit unless there is a critical issue that needs to be addressed sooner. Cheryl Patterson agreed to keep her next visit at the agreed upon time to discuss these results.  ALLERGIES: Allergies  Allergen Reactions    Codeine     Itchy throat, whelps on skin   Other Swelling and Rash    tomatoes    MEDICATIONS: Current Outpatient Medications on File Prior to Visit  Medication Sig Dispense Refill   Albuterol Sulfate (PROAIR RESPICLICK) 242 (90 Base) MCG/ACT AEPB Inhale 2 puffs into the lungs as needed.     amphetamine-dextroamphetamine (ADDERALL) 20 MG tablet Take 1 tablet (20 mg total) by mouth 2 (two) times daily. 60 tablet 0   benzonatate (TESSALON) 100 MG capsule Take 1 capsule (100 mg total) by mouth 3 (three) times daily as needed for cough. 30 capsule 0   fluticasone (FLONASE) 50 MCG/ACT nasal spray Place 2 sprays into both nostrils daily. 16 g 1   loratadine (CLARITIN) 10 MG tablet Take 1 tablet (10 mg total) by mouth daily. 30 tablet 11   Current Facility-Administered Medications on File Prior to Visit  Medication Dose Route Frequency Provider Last Rate Last Dose   0.9 %  sodium chloride infusion  500 mL Intravenous Continuous Milus Banister, MD        PAST MEDICAL HISTORY: Past Medical History:  Diagnosis Date   ADD (attention deficit disorder)    Allergy    Anxiety    Asthma    per pt- bronchitis brings on the symptoms   Bipolar depression ([place])    Chronic headaches    Chronic kidney disease    kidney stones   Constipation    Depression    [facility]---suicidal ideation   Dyspnea    GERD (gastroesophageal reflux disease)    Hyperlipidemia    no meds taken as of 04-20-16   IBS (irritable bowel syndrome)    Joint pain    Kidney infection    Lactose intolerance    Prediabetes    Ruptured disc, thoracic    Sepsis ([place])    2014-  d/t urinary infection   UTI (urinary tract infection)    Vitamin D deficiency     PAST SURGICAL HISTORY: Past Surgical History:  Procedure Laterality Date   BREAST SURGERY     left breast lump removed   COLONOSCOPY     CYSTOSCOPY N/A 03/18/2015   Procedure: CYSTOSCOPY;  Surgeon: Terrance Mass,  MD;  Location: [facility];  Service: Gynecology;  Laterality: N/A;   KIDNEY STONE SURGERY     LAPAROSCOPIC BILATERAL SALPINGECTOMY Bilateral 03/18/2015   Procedure: LAPAROSCOPIC BILATERAL SALPINGECTOMY;  Surgeon: Terrance Mass, MD;  Location: [facility];  Service: Gynecology;  Laterality: Bilateral;   LAPAROSCOPIC VAGINAL HYSTERECTOMY WITH SALPINGO OOPHORECTOMY N/A 03/18/2015   Procedure: LAPAROSCOPIC ASSISTED VAGINAL HYSTERECTOMY WITH SALPINGO OOPHORECTOMY;  Surgeon: Terrance Mass, MD;  Location: [facility];  Service: Gynecology;  Laterality: N/A;   LEEP  2005   LIPOMA EXCISION     MOUTH SURGERY     OTHER SURGICAL HISTORY     RENAL STONE REMOVAL   TUBAL LIGATION      SOCIAL HISTORY: Social History   Tobacco Use  Smoking status: Never Smoker   Smokeless tobacco: Never Used  Substance Use Topics   Alcohol use: No    Alcohol/week: 0.0 standard drinks   Drug use: No    FAMILY HISTORY: Family History  Problem Relation Age of Onset   Colon cancer Maternal Aunt    Cancer Maternal Aunt        colon   Breast cancer Mother    Colon cancer Mother 5   Cancer Mother 68       colon   Anxiety disorder Mother    Hypertension Father    Hyperlipidemia Father    Hypertension Other    Stroke Paternal Grandmother    Hypertension Paternal Grandmother    Heart disease Paternal Grandmother    Breast cancer Paternal Aunt    Cancer Paternal Aunt        breast   Uterine cancer Maternal Aunt    Brain cancer Maternal Aunt    Diabetes Maternal Grandfather    Cancer Maternal Uncle        COLON   Colon cancer Paternal Uncle    Esophageal cancer Neg Hx    Rectal cancer Neg Hx    Stomach cancer Neg Hx     ROS: Review of Systems  Constitutional: Positive for malaise/fatigue.  HENT:       Positive for hay fever.  Eyes:       Wears glasses or contacts.  Respiratory: Positive for shortness of breath ( with activity).   Cardiovascular: Negative for chest  pain.       Positive for leg cramping.  Gastrointestinal: Positive for constipation. Negative for nausea and vomiting.  Musculoskeletal: Positive for back pain, joint pain and myalgias.  Neurological: Positive for headaches.  Endo/Heme/Allergies:       Negative for hypoglycemia. Positive for hyperglycemia. Positive for polyphagia.  Psychiatric/Behavioral: The patient has insomnia.     PHYSICAL EXAM: Blood pressure 106/70, pulse 73, temperature 98.4 F (36.9 C), temperature source Oral, height 5\' 7"  (1.702 m), weight 214 lb (97.1 kg), last menstrual period 12/02/2014, SpO2 98 %. Body mass index is 33.52 kg/m. Physical Exam Vitals signs reviewed.  Constitutional:      Appearance: Normal appearance. She is obese.  HENT:     Head: Normocephalic and atraumatic.     Nose: Nose normal.  Eyes:     General: No scleral icterus.    Extraocular Movements: Extraocular movements intact.  Neck:     Musculoskeletal: Normal range of motion and neck supple.     Thyroid: No thyromegaly.     Comments: Negative for thyromegaly. Cardiovascular:     Rate and Rhythm: Normal rate and regular rhythm.  Pulmonary:     Effort: Pulmonary effort is normal. No respiratory distress.  Abdominal:     Palpations: Abdomen is soft.     Tenderness: There is no abdominal tenderness.     Comments: Positive for obesity.  Musculoskeletal:     Comments: ROM normal in all extremities.  Skin:    General: Skin is warm and dry.  Neurological:     Mental Status: She is alert and oriented to person, place, and time.     Coordination: Coordination normal.  Psychiatric:        Mood and Affect: Mood normal.        Behavior: Behavior normal.     RECENT LABS AND TESTS: BMET    Component Value Date/Time   NA 141 04/27/2018 1054   NA 138 11/23/2016 1122  K 5.4 (H) 04/27/2018 1054   CL 103 04/27/2018 1054   CO2 29 04/27/2018 1054   GLUCOSE 97 04/27/2018 1054   BUN 7 04/27/2018 1054   BUN 7 11/23/2016 1122    CREATININE 0.82 04/27/2018 1054   CREATININE 0.84 07/20/2016 0941   CALCIUM 10.1 04/27/2018 1054   GFRNONAA 81 11/23/2016 1122   GFRAA 94 11/23/2016 1122   Lab Results  Component Value Date   HGBA1C 6.0 04/27/2018   Lab Results  Component Value Date   INSULIN 9.3 11/23/2016   CBC    Component Value Date/Time   WBC 6.1 04/27/2018 1054   RBC 4.62 04/27/2018 1054   HGB 13.4 04/27/2018 1054   HGB 13.0 11/23/2016 1122   HCT 40.1 04/27/2018 1054   HCT 38.8 11/23/2016 1122   PLT 282.0 04/27/2018 1054   MCV 86.8 04/27/2018 1054   MCV 86 11/23/2016 1122   MCH 28.8 11/23/2016 1122   MCH 28.3 07/20/2016 0941   MCHC 33.4 04/27/2018 1054   RDW 13.2 04/27/2018 1054   RDW 13.8 11/23/2016 1122   LYMPHSABS 1.3 04/27/2018 1054   LYMPHSABS 1.9 11/23/2016 1122   MONOABS 0.4 04/27/2018 1054   EOSABS 0.1 04/27/2018 1054   EOSABS 0.1 11/23/2016 1122   BASOSABS 0.0 04/27/2018 1054   BASOSABS 0.0 11/23/2016 1122   Iron/TIBC/Ferritin/ %Sat    Component Value Date/Time   IRON 104 05/31/2008 0911   FERRITIN 33.9 05/31/2008 0911   IRONPCTSAT 27.3 05/31/2008 0911   Lipid Panel     Component Value Date/Time   CHOL 206 (H) 04/27/2018 1054   CHOL 222 (H) 11/23/2016 1122   TRIG 64.0 04/27/2018 1054   HDL 45.60 04/27/2018 1054   HDL 44 11/23/2016 1122   CHOLHDL 5 04/27/2018 1054   VLDL 12.8 04/27/2018 1054   LDLCALC 148 (H) 04/27/2018 1054   LDLCALC 163 (H) 11/23/2016 1122   LDLDIRECT 144.3 06/25/2010 0958   Hepatic Function Panel     Component Value Date/Time   PROT 7.3 04/27/2018 1054   PROT 7.4 11/23/2016 1122   ALBUMIN 4.4 04/27/2018 1054   ALBUMIN 4.4 11/23/2016 1122   AST 14 04/27/2018 1054   ALT 13 04/27/2018 1054   ALKPHOS 96 04/27/2018 1054   BILITOT 0.3 04/27/2018 1054   BILITOT 0.4 11/23/2016 1122   BILIDIR 0.1 08/23/2014 1020      Component Value Date/Time   TSH 1.60 04/27/2018 1054   Results for CARLYN, LEMKE (MRN [id number]) as of 09/12/2018 15:31  Ref.  Range 10/24/2017 11:08  VITD Latest Ref Range: 30.00 - 100.00 ng/mL 16.62 (L)    ECG  shows NSR with a rate of 74 BPM. INDIRECT CALORIMETER done today shows a VO2 of 252 and a REE of 1756. Her calculated basal metabolic rate is 8341 thus her basal metabolic rate is worse than expected.  OBESITY BEHAVIORAL INTERVENTION VISIT  Today's visit was # 1   Starting weight: 214 lbs Starting date: 09/12/18 Today's weight : Weight: 214 lb (97.1 kg)  Today's date: 09/12/2018 Total lbs lost to date: 0  ASK: We discussed the diagnosis of obesity with Erik Obey Jarnagin today and Montanna agreed to give [place] permission to discuss obesity behavioral modification therapy today.  ASSESS: Charletta has the diagnosis of obesity and her BMI today is 33.5. Keilly is in the action stage of change.   ADVISE: Shavontae was educated on the multiple health risks of obesity as well as the benefit of weight loss to improve her  health. She was advised of the need for long term treatment and the importance of lifestyle modifications to improve her current health and to decrease her risk of future health problems.  AGREE: Multiple dietary modification options and treatment options were discussed and Shawnta agreed to follow the recommendations documented in the above note.  ARRANGE: Shanikwa was educated on the importance of frequent visits to treat obesity as outlined per CMS and USPSTF guidelines and agreed to schedule her next follow up appointment today.   IMarcille Blanco, CMA, am acting as transcriptionist for Starlyn Skeans, MD I have reviewed the above documentation for accuracy and completeness, and I agree with the above. -Dennard Nip, MD

## 2018-09-13 LAB — LIPID PANEL WITH LDL/HDL RATIO
Cholesterol, Total: 252 mg/dL — ABNORMAL HIGH (ref 100–199)
HDL: 50 mg/dL (ref >39)
LDL Calculated: 190 mg/dL — ABNORMAL HIGH (ref 0–99)
LDl/HDL Ratio: 3.8 ratio — ABNORMAL HIGH (ref 0.0–3.2)
Triglycerides: 59 mg/dL (ref 0–149)
VLDL Cholesterol Cal: 12 mg/dL (ref 5–40)

## 2018-09-13 LAB — COMPREHENSIVE METABOLIC PANEL
ALT: 18 IU/L (ref 0–32)
AST: 18 IU/L (ref 0–40)
Albumin/Globulin Ratio: 1.6 (ref 1.2–2.2)
Albumin: 4.4 g/dL (ref 3.8–4.8)
Alkaline Phosphatase: 103 IU/L (ref 39–117)
BUN/Creatinine Ratio: 14 (ref 9–23)
BUN: 12 mg/dL (ref 6–24)
Bilirubin Total: 0.2 mg/dL (ref 0.0–1.2)
CO2: 25 mmol/L (ref 20–29)
Calcium: 9.6 mg/dL (ref 8.7–10.2)
Chloride: 97 mmol/L (ref 96–106)
Creatinine, Ser: 0.86 mg/dL (ref 0.57–1.00)
GFR calc Af Amer: 92 mL/min/{1.73_m2} (ref >59)
GFR calc non Af Amer: 80 mL/min/{1.73_m2} (ref >59)
Globulin, Total: 2.8 g/dL (ref 1.5–4.5)
Glucose: 91 mg/dL (ref 65–99)
Potassium: 4.7 mmol/L (ref 3.5–5.2)
Sodium: 137 mmol/L (ref 134–144)
Total Protein: 7.2 g/dL (ref 6.0–8.5)

## 2018-09-13 LAB — CBC WITH DIFFERENTIAL
Basophils Absolute: 0 10*3/uL (ref 0.0–0.2)
Basos: 1 %
EOS (ABSOLUTE): 0.1 10*3/uL (ref 0.0–0.4)
Eos: 2 %
Hematocrit: 41.1 % (ref 34.0–46.6)
Hemoglobin: 13.6 g/dL (ref 11.1–15.9)
Immature Grans (Abs): 0 10*3/uL (ref 0.0–0.1)
Immature Granulocytes: 1 %
Lymphocytes Absolute: 2.1 10*3/uL (ref 0.7–3.1)
Lymphs: 34 %
MCH: 28.3 pg (ref 26.6–33.0)
MCHC: 33.1 g/dL (ref 31.5–35.7)
MCV: 86 fL (ref 79–97)
Monocytes Absolute: 0.3 10*3/uL (ref 0.1–0.9)
Monocytes: 5 %
Neutrophils Absolute: 3.6 10*3/uL (ref 1.4–7.0)
Neutrophils: 57 %
RBC: 4.8 x10E6/uL (ref 3.77–5.28)
RDW: 12.6 % (ref 11.7–15.4)
WBC: 6.3 10*3/uL (ref 3.4–10.8)

## 2018-09-13 LAB — HEMOGLOBIN A1C
Est. average glucose Bld gHb Est-mCnc: 120 mg/dL
Hgb A1c MFr Bld: 5.8 % — ABNORMAL HIGH (ref 4.8–5.6)

## 2018-09-13 LAB — VITAMIN D 25 HYDROXY (VIT D DEFICIENCY, FRACTURES): Vit D, 25-Hydroxy: 26.9 ng/mL — ABNORMAL LOW (ref 30.0–100.0)

## 2018-09-13 LAB — TSH: TSH: 1.79 u[IU]/mL (ref 0.450–4.500)

## 2018-09-13 LAB — FOLATE: Folate: 9.3 ng/mL (ref >3.0)

## 2018-09-13 LAB — INSULIN, RANDOM: INSULIN: 16.7 u[IU]/mL (ref 2.6–24.9)

## 2018-09-13 LAB — T4, FREE: Free T4: 1.01 ng/dL (ref 0.82–1.77)

## 2018-09-13 LAB — VITAMIN B12: Vitamin B-12: 1081 pg/mL (ref 232–1245)

## 2018-09-13 LAB — T3: T3, Total: 121 ng/dL (ref 71–180)

## 2018-09-13 LAB — SPECIMEN STATUS REPORT

## 2018-09-19 ENCOUNTER — Encounter (INDEPENDENT_AMBULATORY_CARE_PROVIDER_SITE_OTHER): Payer: Self-pay | Admitting: Family Medicine

## 2018-09-19 NOTE — Telephone Encounter (Signed)
Please review

## 2018-09-25 ENCOUNTER — Other Ambulatory Visit: Payer: Self-pay

## 2018-09-25 ENCOUNTER — Ambulatory Visit (INDEPENDENT_AMBULATORY_CARE_PROVIDER_SITE_OTHER): Payer: Managed Care, Other (non HMO) | Admitting: Psychology

## 2018-09-25 DIAGNOSIS — F330 Major depressive disorder, recurrent, mild: Secondary | ICD-10-CM | POA: Diagnosis not present
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
## 2018-09-26 ENCOUNTER — Encounter (INDEPENDENT_AMBULATORY_CARE_PROVIDER_SITE_OTHER): Payer: Self-pay | Admitting: Family Medicine

## 2018-09-26 ENCOUNTER — Ambulatory Visit (INDEPENDENT_AMBULATORY_CARE_PROVIDER_SITE_OTHER): Payer: Managed Care, Other (non HMO) | Admitting: Family Medicine

## 2018-09-26 VITALS — BP 114/72 | HR 91 | Temp 98.6°F | Ht 67.0 in | Wt 215.0 lb

## 2018-09-26 DIAGNOSIS — Z6833 Body mass index (BMI) 33.0-33.9, adult: Secondary | ICD-10-CM

## 2018-09-26 DIAGNOSIS — E669 Obesity, unspecified: Secondary | ICD-10-CM

## 2018-09-26 DIAGNOSIS — R7303 Prediabetes: Secondary | ICD-10-CM | POA: Diagnosis not present

## 2018-09-26 DIAGNOSIS — E782 Mixed hyperlipidemia: Secondary | ICD-10-CM

## 2018-09-26 DIAGNOSIS — Z9189 Other specified personal risk factors, not elsewhere classified: Secondary | ICD-10-CM | POA: Diagnosis not present

## 2018-09-26 DIAGNOSIS — E559 Vitamin D deficiency, unspecified: Secondary | ICD-10-CM | POA: Diagnosis not present

## 2018-09-26 MED ORDER — VITAMIN D (ERGOCALCIFEROL) 1.25 MG (50000 UNIT) PO CAPS: 50000.0000 [IU] | ORAL_CAPSULE | ORAL | 0 refills | Status: DC

## 2018-09-26 MED ORDER — METFORMIN HCL 500 MG PO TABS: 500.0000 mg | ORAL_TABLET | Freq: Every day | ORAL | 0 refills | Status: DC

## 2018-09-27 NOTE — Progress Notes (Signed)
Office: [phone]  /  Fax: [phone]   HPI:   Chief Complaint: OBESITY Cheryl Patterson is here to discuss her progress with her obesity treatment plan. She is on the keep a food journal with 1200 to 1400 calories and 75+ grams of protein daily plan and is following her eating plan approximately 60 to 70 % of the time. She states she is walking 30 minutes 3 times per week. Cheryl Patterson struggled to journal, and instead of keeping track of calories and protein, she wrote down the general foods that she ate for three days. Cheryl Patterson often skipped meals and she ate a lot of refined carbohydrates. Her weight is 215 lb (97.5 kg) today and has had a weight gain of 1 pound over a period of 2 weeks since her last visit. She has gained 12 lbs since starting treatment with [place].  Vitamin D deficiency (new) Cheryl Patterson has a new diagnosis of vitamin D deficiency. She is not currently taking vit D.  Cheryl Patterson admits to fatigue and she denies nausea, vomiting or muscle weakness.  Hyperlipidemia (mixed) Cheryl Patterson has mixed hyperlipidemia and her LDL continues to elevate. She is not on stating. Cheryl Patterson would like to try to control her cholesterol levels with intensive lifestyle modification including a low saturated fat diet, exercise and weight loss, but she is struggling to follow our easy plan. She denies any chest pain, claudication or myalgias.  Pre-Diabetes Cheryl Patterson has a diagnosis of prediabetes. Her A1c continues to be elevated. She admits to polyphagia. Cheryl Patterson was unable to follow her plan 2 weeks ago. She is not taking metformin currently and continues to work on diet and exercise to decrease risk of diabetes.   At risk for diabetes Cheryl Patterson is at higher than average risk for developing diabetes due to her obesity and pre-diabetes. She currently denies polyuria or polydipsia.  ASSESSMENT AND PLAN:  Vitamin D deficiency - Plan: Vitamin D, Ergocalciferol, (DRISDOL) 1.25 MG (50000 UT) CAPS capsule  Mixed  hyperlipidemia  Prediabetes - Plan: metFORMIN (GLUCOPHAGE) 500 MG tablet  At risk for diabetes mellitus  Class 1 obesity with serious comorbidity and body mass index (BMI) of 33.0 to 33.9 in adult, unspecified obesity type  PLAN:  Vitamin D Deficiency Cheryl Patterson was informed that low vitamin D levels contributes to fatigue and are associated with obesity, breast, and colon cancer. She agrees to start prescription Vit D @50 ,000 IU every week #4 with no refills and will follow up for routine testing of vitamin D, at least 2-3 times per year. She was informed of the risk of over-replacement of vitamin D and agrees to not increase her dose unless she discusses this with [place] first. Cheryl Patterson agrees to follow up with our clinic in 2 weeks.  Hyperlipidemia (mixed) Cheryl Patterson was informed of the American Heart Association Guidelines emphasizing intensive lifestyle modifications as the first line treatment for mixed hyperlipidemia. We discussed many lifestyle modifications today in depth. Cheryl Patterson will get back to her diet prescription strictly, and we will recheck labs in 3 months. She will work on decreasing saturated fats such as fatty red meat, butter and many fried foods. She will also increase vegetables and lean protein in her diet and continue to work on exercise and weight loss efforts.  Pre-Diabetes Cheryl Patterson will continue to work on weight loss, exercise, and decreasing simple carbohydrates in her diet to help decrease the risk of diabetes. We dicussed metformin including benefits and risks. She was informed that eating too many simple carbohydrates or too many calories  at one sitting increases the likelihood of GI side effects. Cheryl Patterson agrees to start Metformin 500 mg qAM #30 with no refills and follow up with [place] as directed to monitor her progress. She will get back to keeping a proper food journal.  Diabetes risk counseling Cheryl Patterson was given extended (15 minutes) diabetes prevention counseling  today. She is 48 y.o. female and has risk factors for diabetes including obesity and pre-diabetes. We discussed intensive lifestyle modifications today with an emphasis on weight loss as well as increasing exercise and decreasing simple carbohydrates in her diet.  Obesity Cheryl Patterson is currently in the action stage of change. As such, her goal is to continue with weight loss efforts She has agreed to keep a food journal with 1200 to 1400 calories and 75+ grams of protein daily Cheryl Patterson has been instructed to work up to a goal of 150 minutes of combined cardio and strengthening exercise per week for weight loss and overall health benefits. We discussed the following Behavioral Modification Strategies today: planning for success, no skipping meals, keeping healthy foods in the home, increasing lean protein intake, decreasing simple carbohydrates, increasing vegetables, decrease eating out, work on meal planning and easy cooking plans and avoiding temptations Patient was advised, that her strategy wasn't working and to try my advice instead.  Cheryl Patterson has agreed to follow up with our clinic in 2 weeks. She was informed of the importance of frequent follow up visits to maximize her success with intensive lifestyle modifications for her multiple health conditions.  ALLERGIES: Allergies  Allergen Reactions  . Codeine     Itchy throat, whelps on skin  . Other Swelling and Rash    tomatoes    MEDICATIONS: Current Outpatient Medications on File Prior to Visit  Medication Sig Dispense Refill  . Albuterol Sulfate (PROAIR RESPICLICK) 786 (90 Base) MCG/ACT AEPB Inhale 2 puffs into the lungs as needed.    Marland Kitchen amphetamine-dextroamphetamine (ADDERALL) 20 MG tablet Take 1 tablet (20 mg total) by mouth 2 (two) times daily. 60 tablet 0  . benzonatate (TESSALON) 100 MG capsule Take 1 capsule (100 mg total) by mouth 3 (three) times daily as needed for cough. 30 capsule 0  . fluticasone (FLONASE) 50 MCG/ACT nasal  spray Place 2 sprays into both nostrils daily. 16 g 1  . loratadine (CLARITIN) 10 MG tablet Take 1 tablet (10 mg total) by mouth daily. 30 tablet 11   Current Facility-Administered Medications on File Prior to Visit  Medication Dose Route Frequency Provider Last Rate Last Dose  . 0.9 %  sodium chloride infusion  500 mL Intravenous Continuous Milus Banister, MD        PAST MEDICAL HISTORY: Past Medical History:  Diagnosis Date  . ADD (attention deficit disorder)   . Allergy   . Anxiety   . Asthma    per pt- bronchitis brings on the symptoms  . Bipolar depression ([place])   . Chronic headaches   . Chronic kidney disease    kidney stones  . Constipation   . Depression    [facility]---suicidal ideation  . Dyspnea   . GERD (gastroesophageal reflux disease)   . Hyperlipidemia    no meds taken as of 04-20-16  . IBS (irritable bowel syndrome)   . Joint pain   . Kidney infection   . Lactose intolerance   . Prediabetes   . Ruptured disc, thoracic   . Sepsis ([place])    2014-  d/t urinary infection  . UTI (urinary  tract infection)   . Vitamin D deficiency     PAST SURGICAL HISTORY: Past Surgical History:  Procedure Laterality Date  . BREAST SURGERY     left breast lump removed  . COLONOSCOPY    . CYSTOSCOPY N/A 03/18/2015   Procedure: CYSTOSCOPY;  Surgeon: Terrance Mass, MD;  Location: [facility];  Service: Gynecology;  Laterality: N/A;  . KIDNEY STONE SURGERY    . LAPAROSCOPIC BILATERAL SALPINGECTOMY Bilateral 03/18/2015   Procedure: LAPAROSCOPIC BILATERAL SALPINGECTOMY;  Surgeon: Terrance Mass, MD;  Location: [facility];  Service: Gynecology;  Laterality: Bilateral;  . LAPAROSCOPIC VAGINAL HYSTERECTOMY WITH SALPINGO OOPHORECTOMY N/A 03/18/2015   Procedure: LAPAROSCOPIC ASSISTED VAGINAL HYSTERECTOMY WITH SALPINGO OOPHORECTOMY;  Surgeon: Terrance Mass, MD;  Location: [facility];  Service: Gynecology;  Laterality: N/A;  . LEEP  2005  . LIPOMA EXCISION    . MOUTH SURGERY     . OTHER SURGICAL HISTORY     RENAL STONE REMOVAL  . TUBAL LIGATION      SOCIAL HISTORY: Social History   Tobacco Use  . Smoking status: Never Smoker  . Smokeless tobacco: Never Used  Substance Use Topics  . Alcohol use: No    Alcohol/week: 0.0 standard drinks  . Drug use: No    FAMILY HISTORY: Family History  Problem Relation Age of Onset  . Colon cancer Maternal Aunt   . Cancer Maternal Aunt        colon  . Breast cancer Mother   . Colon cancer Mother 1  . Cancer Mother 79       colon  . Anxiety disorder Mother   . Hypertension Father   . Hyperlipidemia Father   . Hypertension Other   . Stroke Paternal Grandmother   . Hypertension Paternal Grandmother   . Heart disease Paternal Grandmother   . Breast cancer Paternal Aunt   . Cancer Paternal Aunt        breast  . Uterine cancer Maternal Aunt   . Brain cancer Maternal Aunt   . Diabetes Maternal Grandfather   . Cancer Maternal Uncle        COLON  . Colon cancer Paternal Uncle   . Esophageal cancer Neg Hx   . Rectal cancer Neg Hx   . Stomach cancer Neg Hx     ROS: Review of Systems  Constitutional: Positive for malaise/fatigue. Negative for weight loss.  Cardiovascular: Negative for chest pain and claudication.  Gastrointestinal: Negative for nausea and vomiting.  Genitourinary: Negative for frequency.  Musculoskeletal: Negative for myalgias.       Negative for muscle weakness  Endo/Heme/Allergies: Negative for polydipsia.       Positive for polyphagia    PHYSICAL EXAM: Blood pressure 114/72, pulse 91, temperature 98.6 F (37 C), temperature source Oral, height 5\' 7"  (1.702 m), weight 215 lb (97.5 kg), last menstrual period 12/02/2014, SpO2 97 %. Body mass index is 33.67 kg/m. Physical Exam  RECENT LABS AND TESTS: BMET    Component Value Date/Time   NA 137 09/12/2018 0000   K 4.7 09/12/2018 0000   CL 97 09/12/2018 0000   CO2 25 09/12/2018 0000   GLUCOSE 91 09/12/2018 0000   GLUCOSE 97  04/27/2018 1054   BUN 12 09/12/2018 0000   CREATININE 0.86 09/12/2018 0000   CREATININE 0.84 07/20/2016 0941   CALCIUM 9.6 09/12/2018 0000   GFRNONAA 80 09/12/2018 0000   GFRAA 92 09/12/2018 0000   Lab Results  Component Value Date   HGBA1C 5.8 (H)  09/12/2018   HGBA1C 6.0 04/27/2018   HGBA1C 5.8 (H) 11/23/2016   Lab Results  Component Value Date   INSULIN 16.7 09/12/2018   INSULIN 9.3 11/23/2016   CBC    Component Value Date/Time   WBC 6.3 09/12/2018 0000   WBC 6.1 04/27/2018 1054   RBC 4.80 09/12/2018 0000   RBC 4.62 04/27/2018 1054   HGB 13.6 09/12/2018 0000   HCT 41.1 09/12/2018 0000   PLT 282.0 04/27/2018 1054   MCV 86 09/12/2018 0000   MCH 28.3 09/12/2018 0000   MCH 28.3 07/20/2016 0941   MCHC 33.1 09/12/2018 0000   MCHC 33.4 04/27/2018 1054   RDW 12.6 09/12/2018 0000   LYMPHSABS 2.1 09/12/2018 0000   MONOABS 0.4 04/27/2018 1054   EOSABS 0.1 09/12/2018 0000   BASOSABS 0.0 09/12/2018 0000   Iron/TIBC/Ferritin/ %Sat    Component Value Date/Time   IRON 104 05/31/2008 0911   FERRITIN 33.9 05/31/2008 0911   IRONPCTSAT 27.3 05/31/2008 0911   Lipid Panel     Component Value Date/Time   CHOL 252 (H) 09/12/2018 0000   TRIG 59 09/12/2018 0000   HDL 50 09/12/2018 0000   CHOLHDL 5 04/27/2018 1054   VLDL 12.8 04/27/2018 1054   LDLCALC 190 (H) 09/12/2018 0000   LDLDIRECT 144.3 06/25/2010 0958   Hepatic Function Panel     Component Value Date/Time   PROT 7.2 09/12/2018 0000   ALBUMIN 4.4 09/12/2018 0000   AST 18 09/12/2018 0000   ALT 18 09/12/2018 0000   ALKPHOS 103 09/12/2018 0000   BILITOT 0.2 09/12/2018 0000   BILIDIR 0.1 08/23/2014 1020      Component Value Date/Time   TSH 1.790 09/12/2018 0000   TSH 1.60 04/27/2018 1054   TSH 1.16 10/24/2017 1108     Ref. Range 09/12/2018 00:00  Vitamin D, 25-Hydroxy Latest Ref Range: 30.0 - 100.0 ng/mL 26.9 (L)    OBESITY BEHAVIORAL INTERVENTION VISIT  Today's visit was # 5   Starting weight: 203 lbs  Starting date: 11/23/2016 Today's weight : 215 lbs  Today's date: 09/26/2018 Total lbs lost to date: 0    09/26/2018  Height 5\' 7"  (1.702 m)  Weight 215 lb (97.5 kg)  BMI (Calculated) 33.67  BLOOD PRESSURE - SYSTOLIC 741  BLOOD PRESSURE - DIASTOLIC 72   Body Fat % 28.7 %  Total Body Water (lbs) 81.8 lbs    ASK: We discussed the diagnosis of obesity with Cheryl Patterson today and Cheryl Patterson agreed to give [place] permission to discuss obesity behavioral modification therapy today.  ASSESS: Cheryl Patterson has the diagnosis of obesity and her BMI today is 33.67 Cheryl Patterson is in the action stage of change   ADVISE: Cheryl Patterson was educated on the multiple health risks of obesity as well as the benefit of weight loss to improve her health. She was advised of the need for long term treatment and the importance of lifestyle modifications to improve her current health and to decrease her risk of future health problems.  AGREE: Multiple dietary modification options and treatment options were discussed and  Cheryl Patterson agreed to follow the recommendations documented in the above note.  ARRANGE: Cheryl Patterson was educated on the importance of frequent visits to treat obesity as outlined per CMS and USPSTF guidelines and agreed to schedule her next follow up appointment today.  I, Doreene Nest, am acting as transcriptionist for Dennard Nip, MD  I have reviewed the above documentation for accuracy and completeness, and I agree with the above. -Malikye Reppond  Leafy Ro, MD

## 2018-10-05 NOTE — Progress Notes (Signed)
Office: [phone]  /  Fax: [phone]    Date: October 09, 2018 Appointment Start Time: 8:00am Duration: 28 minutes Provider: Glennie Isle, Psy.D. Type of Session: Individual Therapy  Location of Patient: Home Location of Provider: Provider's Home Type of Contact: Telepsychological Visit via [employer] Web[employer]   Session Content: Cheryl Patterson is a 48 y.o. female presenting via [place] for a follow-up appointment to address the previously established treatment goal of decreasing emotional eating. Today's appointment was a telepsychological visit, as this provider's clinic is seeing a limited number of patients for in-person visits due to COVID-19. Therapeutic services will resume to in-person appointments once deemed appropriate. Cheryl Patterson expressed understanding regarding the rationale for telepsychological services, and provided verbal consent for today's appointment. Prior to proceeding with today's appointment, Cheryl Patterson's physical location at the time of this appointment was obtained. Cheryl Patterson reported she was at home and provided the address. In the event of technical difficulties, Cheryl Patterson shared a phone number she could be reached at. Cheryl Patterson and this provider participated in today's telepsychological service. Also, Cheryl Patterson denied anyone else being present in the room or on the WebEx appointment.  This provider conducted a brief check-in and verbally administered the PHQ-9 and GAD-7. Cheryl Patterson stated, "Everything has been fine." She discussed ongoing stressors due to current events; therefore, noted she will sometimes turn off the news and watch cartoons. This was validated, and positively reinforced. A risk assessment was completed. Since the last appointment with this provider, Cheryl Patterson denied experiencing suicidal and homicidal ideation, plan, and intent. She noted she still has her safety plan and has easy access to it. Additionally, Cheryl Patterson shared she is listening to music to help her stay  "encouraged."   Regarding eating, Cheryl Patterson reported she feels things are going better and discussed a reduction in emotional eating when she is busy. Psychoeducation regarding triggers for emotional eating was provided. Cheryl Patterson was provided a handout via e-mail, and encouraged to utilize the handout between now and the next appointment to increase awareness of triggers and frequency. Cheryl Patterson agreed. This provider also discussed behavioral strategies for specific triggers, such as placing the utensil down when conversing to avoid mindless eating. Cheryl Patterson provided verbal consent during today's appointment for this provider to send the handout via e-mail. Cheryl Patterson was receptive to today's session as evidenced by openness to sharing, responsiveness to feedback, and willingness to identify triggers for emotional eating.  Mental Status Examination:  Appearance: neat Behavior: cooperative Mood: euthymic Affect: mood congruent Speech: normal in rate, volume, and tone Eye Contact: appropriate Psychomotor Activity: appropriate Thought Process: linear, logical, and goal directed  Content/Perceptual Disturbances: denies suicidal and homicidal ideation, plan, and intent and no hallucinations, delusions, bizarre thinking or behavior reported or observed Orientation: time, person, place and purpose of appointment Cognition/Sensorium: memory, attention, language, and fund of knowledge intact  Insight: fair Judgment: fair  Structured Assessment Results: The Patient Health Questionnaire-9 (PHQ-9) is a self-report measure that assesses symptoms and severity of depression over the course of the last two weeks. Cheryl Patterson obtained a score of 1, suggesting minimal depression. Taneya finds the endorsed symptom to be somewhat difficult. Little interest or pleasure in doing things 0  Feeling down, depressed, or hopeless 0  Trouble falling or staying asleep, or sleeping too much 0  Feeling tired or having little  energy 1  Poor appetite or overeating 0  Feeling bad about yourself --- or that you are a failure or have let yourself or your family down 0  Trouble concentrating on things, such  as reading the newspaper or watching television 0  Moving or speaking so slowly that other people could have noticed? Or the opposite --- being so fidgety or restless that you have been moving around a lot more than usual 0  Thoughts that you would be better off dead or hurting yourself in some way 0  PHQ-9 Score 1    The Generalized Anxiety Disorder-7 (GAD-7) is a brief self-report measure that assesses symptoms of anxiety over the course of the last two weeks. Cheryl Patterson obtained a score of 8 suggesting mild anxiety. Cheryl Patterson finds the endorsed symptoms to be somewhat difficult. Feeling nervous, anxious, on edge 2  Not being able to stop or control worrying 1  Worrying too much about different things 1  Trouble relaxing 0  Being so restless that it's hard to sit still 1  Becoming easily annoyed or irritable 2  Feeling afraid as if something awful might happen 1  GAD-7 Score 8   Interventions:  Conducted a brief chart review Verbal administration of PHQ-9 and GAD-7 for symptom monitoring Provided empathic reflections and validation Psychoeducation provided regarding triggers for emotional eating Provided positive reinforcement Focused on rapport building Employed supportive psychotherapy interventions to facilitate reduced distress, and to improve coping skills with identified stressors  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 Progress is limited, as Cheryl Patterson has just begun treatment with this provider; however, she is receptive to the interaction and interventions and rapport is being established.    Plan: Cheryl Patterson continues to appear able and willing to participate as evidenced by engagement in reciprocal conversation, and asking questions for clarification as appropriate. The next appointment will be scheduled in two weeks, which will be via [employer]. Once this provider's office resumes in-person appointments and it is deemed appropriate, Cheryl Patterson will be notified. The next session will focus on reviewing triggers for emotional eating, and the introduction of mindfulness.

## 2018-10-09 ENCOUNTER — Ambulatory Visit (INDEPENDENT_AMBULATORY_CARE_PROVIDER_SITE_OTHER): Payer: Managed Care, Other (non HMO) | Admitting: Psychology

## 2018-10-09 ENCOUNTER — Other Ambulatory Visit: Payer: Self-pay

## 2018-10-09 DIAGNOSIS — F330 Major depressive disorder, recurrent, mild: Secondary | ICD-10-CM | POA: Diagnosis not present

## 2018-10-10 ENCOUNTER — Encounter (INDEPENDENT_AMBULATORY_CARE_PROVIDER_SITE_OTHER): Payer: Self-pay | Admitting: Family Medicine

## 2018-10-10 ENCOUNTER — Ambulatory Visit (INDEPENDENT_AMBULATORY_CARE_PROVIDER_SITE_OTHER): Payer: Managed Care, Other (non HMO) | Admitting: Family Medicine

## 2018-10-10 ENCOUNTER — Other Ambulatory Visit: Payer: Self-pay

## 2018-10-10 VITALS — BP 106/70 | HR 96 | Temp 98.7°F | Ht 67.0 in | Wt 214.0 lb

## 2018-10-10 DIAGNOSIS — R7303 Prediabetes: Secondary | ICD-10-CM | POA: Diagnosis not present

## 2018-10-10 DIAGNOSIS — Z6833 Body mass index (BMI) 33.0-33.9, adult: Secondary | ICD-10-CM | POA: Diagnosis not present

## 2018-10-10 DIAGNOSIS — E669 Obesity, unspecified: Secondary | ICD-10-CM | POA: Diagnosis not present

## 2018-10-11 NOTE — Progress Notes (Signed)
Office: [phone]  /  Fax: [phone]   HPI:   Chief Complaint: OBESITY Cheryl Patterson is here to discuss her progress with her obesity treatment plan. She is on the keep a food journal with 1200 to 1400 calories and 75 grams of protein daily plan and is following her eating plan approximately 60 % of the time. She states she is walking 15 to 20 minutes 2 to 3 times per week. Cheryl Patterson to lose weight slowly. She is struggling to meet her protein goal and she tends to skip breakfast at times, which increase nighttime hunger. Her weight is 214 lb (97.1 kg) today and has had a weight loss of 1 pound over a period of 2 weeks since her last visit. She has gained 11 lbs since starting treatment with [place].  Pre-Diabetes Cheryl Patterson has a diagnosis of prediabetes based on her elevated Hgb A1c and was informed this puts her at greater risk of developing diabetes. Cheryl Patterson is stable on metformin. She still notes polyphagia at times, especially in the evening. Cheryl Patterson is working on diet and exercise to decrease risk of diabetes. She denies nausea, vomiting or hypoglycemia.  ASSESSMENT AND PLAN:  Prediabetes  Class 1 obesity with serious comorbidity and body mass index (BMI) of 33.0 to 33.9 in adult, unspecified obesity type  PLAN:  Pre-Diabetes Cheryl Patterson continue to work on weight loss, exercise, increasing morning protein and decreasing simple carbohydrates in her diet to help decrease the risk of diabetes. We dicussed metformin including benefits and risks. She was informed that eating too many simple carbohydrates or too many calories at one sitting increases the likelihood of GI side effects. Cheryl Patterson Patterson continue metformin for now and a prescription was not written today. We Patterson recheck labs in 2 months and Cheryl Patterson to follow up with [place] as directed to monitor her progress.  I spent > than 50% of the 15 minute visit on counseling as documented in the note.  Obesity Cheryl Patterson  is currently in the action stage of change. As such, her goal is to continue with weight loss efforts She has Patterson to keep a food journal with 1200 to 1400 calories and 75 grams of protein daily Cheryl Patterson has been instructed to work up to a goal of 150 minutes of combined cardio and strengthening exercise per week for weight loss and overall health benefits. We discussed the following Behavioral Modification Strategies today: no skipping meals, increasing lean protein intake, decreasing simple carbohydrates and work on meal planning and easy cooking plans  Cheryl Patterson has Patterson to follow up with our clinic in 2 weeks. She was informed of the importance of frequent follow up visits to maximize her success with intensive lifestyle modifications for her multiple health conditions.  ALLERGIES: Allergies  Allergen Reactions  . Codeine     Itchy throat, whelps on skin  . Other Swelling and Rash    tomatoes    MEDICATIONS: Current Outpatient Medications on File Prior to Visit  Medication Sig Dispense Refill  . Albuterol Sulfate (PROAIR RESPICLICK) 034 (90 Base) MCG/ACT AEPB Inhale 2 puffs into the lungs as needed.    Marland Kitchen amphetamine-dextroamphetamine (ADDERALL) 20 MG tablet Take 1 tablet (20 mg total) by mouth 2 (two) times daily. 60 tablet 0  . benzonatate (TESSALON) 100 MG capsule Take 1 capsule (100 mg total) by mouth 3 (three) times daily as needed for cough. 30 capsule 0  . fluticasone (FLONASE) 50 MCG/ACT nasal spray Place 2 sprays into both nostrils daily. Kersey  g 1  . loratadine (CLARITIN) 10 MG tablet Take 1 tablet (10 mg total) by mouth daily. 30 tablet 11  . metFORMIN (GLUCOPHAGE) 500 MG tablet Take 1 tablet (500 mg total) by mouth daily with breakfast. 30 tablet 0  . Vitamin D, Ergocalciferol, (DRISDOL) 1.25 MG (50000 UT) CAPS capsule Take 1 capsule (50,000 Units total) by mouth every 7 (seven) days. 4 capsule 0   Current Facility-Administered Medications on File Prior to Visit   Medication Dose Route Frequency Provider Last Rate Last Dose  . 0.9 %  sodium chloride infusion  500 mL Intravenous Continuous Milus Banister, MD        PAST MEDICAL HISTORY: Past Medical History:  Diagnosis Date  . ADD (attention deficit disorder)   . Allergy   . Anxiety   . Asthma    per pt- bronchitis brings on the symptoms  . Bipolar depression ([place])   . Chronic headaches   . Chronic kidney disease    kidney stones  . Constipation   . Depression    [facility]---suicidal ideation  . Dyspnea   . GERD (gastroesophageal reflux disease)   . Hyperlipidemia    no meds taken as of 04-20-16  . IBS (irritable bowel syndrome)   . Joint pain   . Kidney infection   . Lactose intolerance   . Prediabetes   . Ruptured disc, thoracic   . Sepsis ([place])    2014-  d/t urinary infection  . UTI (urinary tract infection)   . Vitamin D deficiency     PAST SURGICAL HISTORY: Past Surgical History:  Procedure Laterality Date  . BREAST SURGERY     left breast lump removed  . COLONOSCOPY    . CYSTOSCOPY N/A 03/18/2015   Procedure: CYSTOSCOPY;  Surgeon: Terrance Mass, MD;  Location: [facility];  Service: Gynecology;  Laterality: N/A;  . KIDNEY STONE SURGERY    . LAPAROSCOPIC BILATERAL SALPINGECTOMY Bilateral 03/18/2015   Procedure: LAPAROSCOPIC BILATERAL SALPINGECTOMY;  Surgeon: Terrance Mass, MD;  Location: [facility];  Service: Gynecology;  Laterality: Bilateral;  . LAPAROSCOPIC VAGINAL HYSTERECTOMY WITH SALPINGO OOPHORECTOMY N/A 03/18/2015   Procedure: LAPAROSCOPIC ASSISTED VAGINAL HYSTERECTOMY WITH SALPINGO OOPHORECTOMY;  Surgeon: Terrance Mass, MD;  Location: [facility];  Service: Gynecology;  Laterality: N/A;  . LEEP  2005  . LIPOMA EXCISION    . MOUTH SURGERY    . OTHER SURGICAL HISTORY     RENAL STONE REMOVAL  . TUBAL LIGATION      SOCIAL HISTORY: Social History   Tobacco Use  . Smoking status: Never Smoker  . Smokeless tobacco: Never Used  Substance Use Topics   . Alcohol use: No    Alcohol/week: 0.0 standard drinks  . Drug use: No    FAMILY HISTORY: Family History  Problem Relation Age of Onset  . Colon cancer Maternal Aunt   . Cancer Maternal Aunt        colon  . Breast cancer Mother   . Colon cancer Mother 54  . Cancer Mother 33       colon  . Anxiety disorder Mother   . Hypertension Father   . Hyperlipidemia Father   . Hypertension Other   . Stroke Paternal Grandmother   . Hypertension Paternal Grandmother   . Heart disease Paternal Grandmother   . Breast cancer Paternal Aunt   . Cancer Paternal Aunt        breast  . Uterine cancer Maternal Aunt   .  Brain cancer Maternal Aunt   . Diabetes Maternal Grandfather   . Cancer Maternal Uncle        COLON  . Colon cancer Paternal Uncle   . Esophageal cancer Neg Hx   . Rectal cancer Neg Hx   . Stomach cancer Neg Hx     ROS: Review of Systems  Constitutional: Positive for weight loss.  Gastrointestinal: Negative for nausea and vomiting.  Endo/Heme/Allergies:       Positive for polyphagia Negative for hypoglycemia    PHYSICAL EXAM: Blood pressure 106/70, pulse 96, temperature 98.7 F (37.1 C), temperature source Oral, height 5\' 7"  (1.702 m), weight 214 lb (97.1 kg), last menstrual period 12/02/2014, SpO2 98 %. Body mass index is 33.52 kg/m. Physical Exam Vitals signs reviewed.  Constitutional:      Appearance: Normal appearance. She is well-developed. She is obese.  Cardiovascular:     Rate and Rhythm: Normal rate.  Pulmonary:     Effort: Pulmonary effort is normal.  Musculoskeletal: Normal range of motion.  Skin:    General: Skin is warm and dry.  Neurological:     Mental Status: She is alert and oriented to person, place, and time.  Psychiatric:        Mood and Affect: Mood normal.        Behavior: Behavior normal.     RECENT LABS AND TESTS: BMET    Component Value Date/Time   NA 137 09/12/2018 0000   K 4.7 09/12/2018 0000   CL 97 09/12/2018 0000   CO2  25 09/12/2018 0000   GLUCOSE 91 09/12/2018 0000   GLUCOSE 97 04/27/2018 1054   BUN 12 09/12/2018 0000   CREATININE 0.86 09/12/2018 0000   CREATININE 0.84 07/20/2016 0941   CALCIUM 9.6 09/12/2018 0000   GFRNONAA 80 09/12/2018 0000   GFRAA 92 09/12/2018 0000   Lab Results  Component Value Date   HGBA1C 5.8 (H) 09/12/2018   HGBA1C 6.0 04/27/2018   HGBA1C 5.8 (H) 11/23/2016   Lab Results  Component Value Date   INSULIN 16.7 09/12/2018   INSULIN 9.3 11/23/2016   CBC    Component Value Date/Time   WBC 6.3 09/12/2018 0000   WBC 6.1 04/27/2018 1054   RBC 4.80 09/12/2018 0000   RBC 4.62 04/27/2018 1054   HGB 13.6 09/12/2018 0000   HCT 41.1 09/12/2018 0000   PLT 282.0 04/27/2018 1054   MCV 86 09/12/2018 0000   MCH 28.3 09/12/2018 0000   MCH 28.3 07/20/2016 0941   MCHC 33.1 09/12/2018 0000   MCHC 33.4 04/27/2018 1054   RDW 12.6 09/12/2018 0000   LYMPHSABS 2.1 09/12/2018 0000   MONOABS 0.4 04/27/2018 1054   EOSABS 0.1 09/12/2018 0000   BASOSABS 0.0 09/12/2018 0000   Iron/TIBC/Ferritin/ %Sat    Component Value Date/Time   IRON 104 05/31/2008 0911   FERRITIN 33.9 05/31/2008 0911   IRONPCTSAT 27.3 05/31/2008 0911   Lipid Panel     Component Value Date/Time   CHOL 252 (H) 09/12/2018 0000   TRIG 59 09/12/2018 0000   HDL 50 09/12/2018 0000   CHOLHDL 5 04/27/2018 1054   VLDL 12.8 04/27/2018 1054   LDLCALC 190 (H) 09/12/2018 0000   LDLDIRECT 144.3 06/25/2010 0958   Hepatic Function Panel     Component Value Date/Time   PROT 7.2 09/12/2018 0000   ALBUMIN 4.4 09/12/2018 0000   AST 18 09/12/2018 0000   ALT 18 09/12/2018 0000   ALKPHOS 103 09/12/2018 0000   BILITOT 0.2  09/12/2018 0000   BILIDIR 0.1 08/23/2014 1020      Component Value Date/Time   TSH 1.790 09/12/2018 0000   TSH 1.60 04/27/2018 1054   TSH 1.16 10/24/2017 1108    Results for ELLENA, KAMEN (MRN [id number]) as of 10/11/2018 14:55  Ref. Range 09/12/2018 00:00  Vitamin D, 25-Hydroxy Latest Ref Range:  30.0 - 100.0 ng/mL 26.9 (L)    OBESITY BEHAVIORAL INTERVENTION VISIT  Today's visit was # 6   Starting weight: 203 lbs Starting date: 11/23/2016 Today's weight : 214 lbs Today's date: 10/10/2018 Total lbs lost to date: 0    10/10/2018  Height 5\' 7"  (1.702 m)  Weight 214 lb (97.1 kg)  BMI (Calculated) 33.51  BLOOD PRESSURE - SYSTOLIC 832  BLOOD PRESSURE - DIASTOLIC 70   Body Fat % 54.9 %  Total Body Water (lbs) 80 lbs    ASK: We discussed the diagnosis of obesity with Cheryl Patterson today and Azaliah Patterson to give [place] permission to discuss obesity behavioral modification therapy today.  ASSESS: Cheryl Patterson has the diagnosis of obesity and her BMI today is 33.51 Cheryl Patterson is in the action stage of change   ADVISE: Cheryl Patterson was educated on the multiple health risks of obesity as well as the benefit of weight loss to improve her health. She was advised of the need for long term treatment and the importance of lifestyle modifications to improve her current health and to decrease her risk of future health problems.  AGREE: Multiple dietary modification options and treatment options were discussed and  Cheryl Patterson Patterson to follow the recommendations documented in the above note.  ARRANGE: Cheryl Patterson was educated on the importance of frequent visits to treat obesity as outlined per CMS and USPSTF guidelines and Patterson to schedule her next follow up appointment today.  I, Doreene Nest, am acting as transcriptionist for Dennard Nip, MD  I have reviewed the above documentation for accuracy and completeness, and I agree with the above. -Dennard Nip, MD

## 2018-10-16 ENCOUNTER — Other Ambulatory Visit (INDEPENDENT_AMBULATORY_CARE_PROVIDER_SITE_OTHER): Payer: Self-pay | Admitting: Family Medicine

## 2018-10-16 DIAGNOSIS — E559 Vitamin D deficiency, unspecified: Secondary | ICD-10-CM

## 2018-10-18 ENCOUNTER — Other Ambulatory Visit (INDEPENDENT_AMBULATORY_CARE_PROVIDER_SITE_OTHER): Payer: Self-pay | Admitting: Family Medicine

## 2018-10-18 DIAGNOSIS — R7303 Prediabetes: Secondary | ICD-10-CM

## 2018-10-18 NOTE — Progress Notes (Signed)
Office: [phone]  /  Fax: [phone]    Date: October 25, 2018   Appointment Start Time: 8:02am Duration: 30 minutes Provider: Glennie Isle, Psy.D. Type of Session: Individual Therapy  Location of Patient: Home Location of Provider: [facility] Type of Contact: Telepsychological Visit via [employer] Web[employer]   Session Content: Cheryl Patterson is a 48 y.o. female presenting via [place] for a follow-up appointment to address the previously established treatment goal of decreasing emotional eating. Today's appointment was a telepsychological visit, as this provider's clinic is seeing a limited number of patients for in-person visits due to COVID-19. Therapeutic services will resume to in-person appointments once deemed appropriate. Cheryl Patterson expressed understanding regarding the rationale for telepsychological services, and provided verbal consent for today's appointment. Prior to proceeding with today's appointment, Cheryl Patterson's physical location at the time of this appointment was obtained. Cheryl Patterson reported she was at home and provided the address. In the event of technical difficulties, Cheryl Patterson shared a phone number she could be reached at. Cheryl Patterson and this provider participated in today's telepsychological service. Also, Cheryl Patterson denied anyone else being present in the room or on the WebEx appointment.  This provider conducted a brief check-in and verbally administered the PHQ-9 and GAD-7. Cheryl Patterson stated she had the opportunity to see her grandchild over the weekend and described having a pleasant time. Notably, Cheryl Patterson stated a desire for a referral for grief. She provided verbal consent for this provider to place a referral and notate it would be treatment for grief and emotional eating. She was agreeable to continuing to meet with this provider to focus on emotional eating until established with a new provider. Additionally, a risk assessment was completed. Cheryl Patterson denied  experiencing suicidal and homicidal ideation, plan, and intent since the last appointment with this provider. She reported she continues to have easy access to the developed safety plan, and continues to acknowledge understanding regarding the importance of reaching out to trusted individuals and/or emergency resources if she is unable to ensure safety.   Regarding eating, Cheryl Patterson reported she continues to eat when she is upset or stressed. She also acknowledged, "habitual night time snacking" and noted she typically eats sweets. This was further explored and it was identified evenings are typically when she thinks about the loss of her mother, which was reflected to Cheryl Patterson. It was also reflected night time eating is likely out of habit. Due to her caffeine intake and Adderall, she discussed not feeling a desire to eat during the day. It ws recommended she discuss this further with Dr. Owens Shark; she agreed. Moreover, psychoeducation regarding mindfulness was provided. A handout was provided to [facility] with further information regarding mindfulness, including exercises. This provider also explained the benefit of mindfulness as it relates to emotional eating. [name] was encouraged to engage in the provided exercises between now and the next appointment with this provider (I.e., once a day). Cheryl Patterson agreed. She was led through a mindfulness exercise involving her senses. Cheryl Patterson provided verbal consent during today's appointment for this provider to send the handout on mindfulness via e-mail. Cheryl Patterson was receptive to today's session as evidenced by openness to sharing, responsiveness to feedback, and willingness to engage in mindfulness exercises.  Mental Status Examination:  Appearance: neat Behavior: cooperative Mood: euthymic Affect: mood congruent Speech: normal in rate, volume, and tone Eye Contact: appropriate Psychomotor Activity: appropriate Thought Process: linear, logical, and goal directed   Content/Perceptual Disturbances: denies suicidal and homicidal ideation, plan, and intent and no hallucinations, delusions, bizarre thinking  or behavior reported or observed Orientation: time, person, place and purpose of appointment Cognition/Sensorium: memory, attention, language, and fund of knowledge intact  Insight: good Judgment: good  Structured Assessment Results: The Patient Health Questionnaire-9 (PHQ-9) is a self-report measure that assesses symptoms and severity of depression over the course of the last two weeks. Cheryl Patterson obtained a score of 9 suggesting mild depression. Cheryl Patterson finds the endorsed symptoms to be somewhat difficult. Little interest or pleasure in doing things 1  Feeling down, depressed, or hopeless 3  Trouble falling or staying asleep, or sleeping too much 2  Feeling tired or having little energy 1  Poor appetite or overeating 0  Feeling bad about yourself --- or that you are a failure or have let yourself or your family down 1  Trouble concentrating on things, such as reading the newspaper or watching television 1  Moving or speaking so slowly that other people could have noticed? Or the opposite --- being so fidgety or restless that you have been moving around a lot more than usual 0  Thoughts that you would be better off dead or hurting yourself in some way 0  PHQ-9 Score 9    The Generalized Anxiety Disorder-7 (GAD-7) is a brief self-report measure that assesses symptoms of anxiety over the course of the last two weeks. Cheryl Patterson obtained a score of 8 suggesting mild anxiety. Cheryl Patterson finds the endorsed symptoms to be not difficult at all. Feeling nervous, anxious, on edge 2  Not being able to stop or control worrying 0  Worrying too much about different things 1  Trouble relaxing 0  Being so restless that it's hard to sit still 0  Becoming easily annoyed or irritable 3  Feeling afraid as if something awful might happen 2  GAD-7 Score 8   Interventions:    Conducted a brief chart review Verbal administration of PHQ-9 and GAD-7 for symptom monitoring Provided empathic reflections and validation Reviewed content from the previous session Conducted a risk assessment Psychoeducation provided regarding mindfulness Engaged patient in a mindfulness exercise Discussed option for a referral for longer-term therapeutic services Employed supportive psychotherapy interventions to facilitate reduced distress, and to improve coping skills with identified stressors Employed acceptance and commitment interventions to emphasize mindfulness and acceptance without struggle  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 Cheryl Patterson has demonstrated progress in her goal as evidenced by increased awareness of hunger patterns and triggers for emotional eating. She demonstrates willingness to engage in learned skills.  Plan: Cheryl Patterson continues to appear able and willing to participate as evidenced by engagement in reciprocal conversation, and asking questions for clarification as appropriate. The next appointment will be scheduled in two weeks, which will be via [employer]. Once this provider's office resumes in-person appointments and it is deemed appropriate, Cheryl Patterson will be notified. The next session will focus further on mindfulness. In addition, a referral will be placed for longer-term therapeutic services.

## 2018-10-22 ENCOUNTER — Other Ambulatory Visit (INDEPENDENT_AMBULATORY_CARE_PROVIDER_SITE_OTHER): Payer: Self-pay | Admitting: Family Medicine

## 2018-10-22 DIAGNOSIS — E559 Vitamin D deficiency, unspecified: Secondary | ICD-10-CM

## 2018-10-22 DIAGNOSIS — R7303 Prediabetes: Secondary | ICD-10-CM

## 2018-10-24 ENCOUNTER — Encounter (INDEPENDENT_AMBULATORY_CARE_PROVIDER_SITE_OTHER): Payer: Self-pay | Admitting: Bariatrics

## 2018-10-24 ENCOUNTER — Telehealth (INDEPENDENT_AMBULATORY_CARE_PROVIDER_SITE_OTHER): Payer: Managed Care, Other (non HMO) | Admitting: Bariatrics

## 2018-10-25 ENCOUNTER — Ambulatory Visit (INDEPENDENT_AMBULATORY_CARE_PROVIDER_SITE_OTHER): Payer: Managed Care, Other (non HMO) | Admitting: Psychology

## 2018-10-25 ENCOUNTER — Other Ambulatory Visit: Payer: Self-pay

## 2018-10-25 DIAGNOSIS — F330 Major depressive disorder, recurrent, mild: Secondary | ICD-10-CM

## 2018-10-26 ENCOUNTER — Ambulatory Visit: Payer: Managed Care, Other (non HMO) | Admitting: Family Medicine

## 2018-10-30 ENCOUNTER — Ambulatory Visit: Payer: Managed Care, Other (non HMO) | Admitting: Family Medicine

## 2018-10-31 ENCOUNTER — Encounter (INDEPENDENT_AMBULATORY_CARE_PROVIDER_SITE_OTHER): Payer: Self-pay | Admitting: Bariatrics

## 2018-10-31 ENCOUNTER — Other Ambulatory Visit: Payer: Self-pay

## 2018-10-31 ENCOUNTER — Encounter: Payer: Self-pay | Admitting: Family Medicine

## 2018-10-31 ENCOUNTER — Ambulatory Visit (INDEPENDENT_AMBULATORY_CARE_PROVIDER_SITE_OTHER): Payer: Managed Care, Other (non HMO) | Admitting: Bariatrics

## 2018-10-31 DIAGNOSIS — R7303 Prediabetes: Secondary | ICD-10-CM | POA: Diagnosis not present

## 2018-10-31 DIAGNOSIS — F988 Other specified behavioral and emotional disorders with onset usually occurring in childhood and adolescence: Secondary | ICD-10-CM

## 2018-10-31 DIAGNOSIS — Z6834 Body mass index (BMI) 34.0-34.9, adult: Secondary | ICD-10-CM | POA: Diagnosis not present

## 2018-10-31 DIAGNOSIS — E782 Mixed hyperlipidemia: Secondary | ICD-10-CM | POA: Diagnosis not present

## 2018-10-31 DIAGNOSIS — E669 Obesity, unspecified: Secondary | ICD-10-CM | POA: Diagnosis not present

## 2018-10-31 MED ORDER — METFORMIN HCL 500 MG PO TABS: 500.0000 mg | ORAL_TABLET | Freq: Every day | ORAL | 0 refills | Status: DC

## 2018-11-01 ENCOUNTER — Other Ambulatory Visit: Payer: Self-pay | Admitting: Family Medicine

## 2018-11-01 DIAGNOSIS — F988 Other specified behavioral and emotional disorders with onset usually occurring in childhood and adolescence: Secondary | ICD-10-CM

## 2018-11-01 MED ORDER — AMPHETAMINE-DEXTROAMPHETAMINE 20 MG PO TABS: 20.0000 mg | ORAL_TABLET | Freq: Two times a day (BID) | ORAL | 0 refills | Status: DC

## 2018-11-01 NOTE — Telephone Encounter (Signed)
Adderall refill.   Last OV: 05/31/2018 w/ Edward Last Fill: 08/01/2018 #60 and 0RF UDS: 10/24/2017 Low risk

## 2018-11-01 NOTE — Progress Notes (Signed)
Office: [phone]  /  Fax: [phone] TeleHealth Visit:  Cheryl Patterson has verbally consented to this TeleHealth visit today. The patient is located at home, the provider is located at the [employer] [facility] office. The participants in this visit include the listed provider and patient. The visit was conducted today via Webex.  HPI:   Chief Complaint: OBESITY Cheryl Patterson is here to discuss her progress with her obesity treatment plan. She is keeping a food journal with 1200-1400 calories and 75 grams of protein and is following her eating plan approximately 60% of the time. She states she is walking 3 miles 1 time per week. Cheryl Patterson states that she may have gained 1 lb. She struggles to eat in the a.m. due to Adderall but does eat a [place] sandwich. She reports doing well with her water intake. We were unable to weigh the patient today for this TeleHealth visit. She feels as if she has gained 1 lb since her last visit. She has lost 0 lbs since starting treatment with [place].  Pre-Diabetes Cheryl Patterson has a diagnosis of prediabetes based on her elevated Hgb A1c and was informed this puts her at greater risk of developing diabetes. Her last A1c was 5.8 on 09/12/2018 and her insulin level was 16.7. She is taking metformin currently and continues to work on diet and exercise to decrease risk of diabetes. She denies polyphagia.  Hyperlipidemia (mixed) Cheryl Patterson has hyperlipidemia and has been trying to improve her cholesterol levels with intensive lifestyle modification including a low saturated fat diet, exercise and weight loss. She is on no medication and denies any chest pain.   ASSESSMENT AND PLAN:  Prediabetes - Plan: metFORMIN (GLUCOPHAGE) 500 MG tablet  Mixed hyperlipidemia  Class 1 obesity with serious comorbidity and body mass index (BMI) of 34.0 to 34.9 in adult, unspecified obesity type  PLAN:  Pre-Diabetes Cheryl Patterson will continue to work on weight loss, exercise, and  decreasing simple carbohydrates in her diet to help decrease the risk of diabetes. We dicussed metformin including benefits and risks. She was informed that eating too many simple carbohydrates or too many calories at one sitting increases the likelihood of GI side effects. Cheryl Patterson was given a prescription for metformin 500 mg 1 PO daily with breakfast #30 with 0 refills. She agrees to follow-up with our clinic in 2 weeks.  Hyperlipidemia (mixed) Cheryl Patterson was informed of the American Heart Association Guidelines emphasizing intensive lifestyle modifications as the first line treatment for hyperlipidemia. We discussed many lifestyle modifications today in depth, and Cheryl Patterson will continue to work on decreasing saturated fats such as fatty red meat, butter and many fried foods. Cheryl Patterson will also increase vegetables and lean protein in her diet and continue to work on exercise and weight loss efforts. She will decrease saturated fats and increase MUFA and PUFA's.  Obesity Cheryl Patterson is currently in the action stage of change. As such, her goal is to continue with weight loss efforts. She has agreed to keep a food journal with 1200-1400 calories and 75 grams of protein.  Cheryl Patterson will work on meal planning, intentional eating, increase her protein, and no skipping meals. Cheryl Patterson has been instructed to continue her current exercise regimen for weight loss and overall health benefits (she has increased her exercise this week). We discussed the following Behavioral Modification Strategies today: increasing lean protein intake, decreasing simple carbohydrates, increasing vegetables, increase H20 intake, decrease eating out, no skipping meals, work on meal planning and easy cooking plans.  Cheryl Patterson has  agreed to follow-up with our clinic in 2 weeks. She was informed of the importance of frequent follow-up visits to maximize her success with intensive lifestyle modifications for her multiple health conditions.   ALLERGIES: Allergies  Allergen Reactions  . Codeine     Itchy throat, whelps on skin  . Other Swelling and Rash    tomatoes    MEDICATIONS: Current Outpatient Medications on File Prior to Visit  Medication Sig Dispense Refill  . Albuterol Sulfate (PROAIR RESPICLICK) 734 (90 Base) MCG/ACT AEPB Inhale 2 puffs into the lungs as needed.    Marland Kitchen amphetamine-dextroamphetamine (ADDERALL) 20 MG tablet Take 1 tablet (20 mg total) by mouth 2 (two) times daily. 60 tablet 0  . benzonatate (TESSALON) 100 MG capsule Take 1 capsule (100 mg total) by mouth 3 (three) times daily as needed for cough. 30 capsule 0  . fluticasone (FLONASE) 50 MCG/ACT nasal spray Place 2 sprays into both nostrils daily. 16 g 1  . loratadine (CLARITIN) 10 MG tablet Take 1 tablet (10 mg total) by mouth daily. 30 tablet 11  . Vitamin D, Ergocalciferol, (DRISDOL) 1.25 MG (50000 UT) CAPS capsule Take 1 capsule (50,000 Units total) by mouth every 7 (seven) days. 4 capsule 0   Current Facility-Administered Medications on File Prior to Visit  Medication Dose Route Frequency Provider Last Rate Last Dose  . 0.9 %  sodium chloride infusion  500 mL Intravenous Continuous Milus Banister, MD        PAST MEDICAL HISTORY: Past Medical History:  Diagnosis Date  . ADD (attention deficit disorder)   . Allergy   . Anxiety   . Asthma    per pt- bronchitis brings on the symptoms  . Bipolar depression ([place])   . Chronic headaches   . Chronic kidney disease    kidney stones  . Constipation   . Depression    [facility]---suicidal ideation  . Dyspnea   . GERD (gastroesophageal reflux disease)   . Hyperlipidemia    no meds taken as of 04-20-16  . IBS (irritable bowel syndrome)   . Joint pain   . Kidney infection   . Lactose intolerance   . Prediabetes   . Ruptured disc, thoracic   . Sepsis ([place])    2014-  d/t urinary infection  . UTI (urinary tract infection)   . Vitamin D deficiency     PAST SURGICAL HISTORY: Past  Surgical History:  Procedure Laterality Date  . BREAST SURGERY     left breast lump removed  . COLONOSCOPY    . CYSTOSCOPY N/A 03/18/2015   Procedure: CYSTOSCOPY;  Surgeon: Terrance Mass, MD;  Location: [facility];  Service: Gynecology;  Laterality: N/A;  . KIDNEY STONE SURGERY    . LAPAROSCOPIC BILATERAL SALPINGECTOMY Bilateral 03/18/2015   Procedure: LAPAROSCOPIC BILATERAL SALPINGECTOMY;  Surgeon: Terrance Mass, MD;  Location: [facility];  Service: Gynecology;  Laterality: Bilateral;  . LAPAROSCOPIC VAGINAL HYSTERECTOMY WITH SALPINGO OOPHORECTOMY N/A 03/18/2015   Procedure: LAPAROSCOPIC ASSISTED VAGINAL HYSTERECTOMY WITH SALPINGO OOPHORECTOMY;  Surgeon: Terrance Mass, MD;  Location: [facility];  Service: Gynecology;  Laterality: N/A;  . LEEP  2005  . LIPOMA EXCISION    . MOUTH SURGERY    . OTHER SURGICAL HISTORY     RENAL STONE REMOVAL  . TUBAL LIGATION      SOCIAL HISTORY: Social History   Tobacco Use  . Smoking status: Never Smoker  . Smokeless tobacco: Never Used  Substance Use Topics  .  Alcohol use: No    Alcohol/week: 0.0 standard drinks  . Drug use: No    FAMILY HISTORY: Family History  Problem Relation Age of Onset  . Colon cancer Maternal Aunt   . Cancer Maternal Aunt        colon  . Breast cancer Mother   . Colon cancer Mother 47  . Cancer Mother 41       colon  . Anxiety disorder Mother   . Hypertension Father   . Hyperlipidemia Father   . Hypertension Other   . Stroke Paternal Grandmother   . Hypertension Paternal Grandmother   . Heart disease Paternal Grandmother   . Breast cancer Paternal Aunt   . Cancer Paternal Aunt        breast  . Uterine cancer Maternal Aunt   . Brain cancer Maternal Aunt   . Diabetes Maternal Grandfather   . Cancer Maternal Uncle        COLON  . Colon cancer Paternal Uncle   . Esophageal cancer Neg Hx   . Rectal cancer Neg Hx   . Stomach cancer Neg Hx    ROS: Review of Systems  Endo/Heme/Allergies:       Negative for  polyphagia.   PHYSICAL EXAM: Pt in no acute distress  RECENT LABS AND TESTS: BMET    Component Value Date/Time   NA 137 09/12/2018 0000   K 4.7 09/12/2018 0000   CL 97 09/12/2018 0000   CO2 25 09/12/2018 0000   GLUCOSE 91 09/12/2018 0000   GLUCOSE 97 04/27/2018 1054   BUN 12 09/12/2018 0000   CREATININE 0.86 09/12/2018 0000   CREATININE 0.84 07/20/2016 0941   CALCIUM 9.6 09/12/2018 0000   GFRNONAA 80 09/12/2018 0000   GFRAA 92 09/12/2018 0000   Lab Results  Component Value Date   HGBA1C 5.8 (H) 09/12/2018   HGBA1C 6.0 04/27/2018   HGBA1C 5.8 (H) 11/23/2016   Lab Results  Component Value Date   INSULIN 16.7 09/12/2018   INSULIN 9.3 11/23/2016   CBC    Component Value Date/Time   WBC 6.3 09/12/2018 0000   WBC 6.1 04/27/2018 1054   RBC 4.80 09/12/2018 0000   RBC 4.62 04/27/2018 1054   HGB 13.6 09/12/2018 0000   HCT 41.1 09/12/2018 0000   PLT 282.0 04/27/2018 1054   MCV 86 09/12/2018 0000   MCH 28.3 09/12/2018 0000   MCH 28.3 07/20/2016 0941   MCHC 33.1 09/12/2018 0000   MCHC 33.4 04/27/2018 1054   RDW 12.6 09/12/2018 0000   LYMPHSABS 2.1 09/12/2018 0000   MONOABS 0.4 04/27/2018 1054   EOSABS 0.1 09/12/2018 0000   BASOSABS 0.0 09/12/2018 0000   Iron/TIBC/Ferritin/ %Sat    Component Value Date/Time   IRON 104 05/31/2008 0911   FERRITIN 33.9 05/31/2008 0911   IRONPCTSAT 27.3 05/31/2008 0911   Lipid Panel     Component Value Date/Time   CHOL 252 (H) 09/12/2018 0000   TRIG 59 09/12/2018 0000   HDL 50 09/12/2018 0000   CHOLHDL 5 04/27/2018 1054   VLDL 12.8 04/27/2018 1054   LDLCALC 190 (H) 09/12/2018 0000   LDLDIRECT 144.3 06/25/2010 0958   Hepatic Function Panel     Component Value Date/Time   PROT 7.2 09/12/2018 0000   ALBUMIN 4.4 09/12/2018 0000   AST 18 09/12/2018 0000   ALT 18 09/12/2018 0000   ALKPHOS 103 09/12/2018 0000   BILITOT 0.2 09/12/2018 0000   BILIDIR 0.1 08/23/2014 1020  Component Value Date/Time   TSH 1.790 09/12/2018  0000   TSH 1.60 04/27/2018 1054   TSH 1.16 10/24/2017 1108   Results for RITAL, CAVEY (MRN [id number]) as of 11/01/2018 10:18  Ref. Range 09/12/2018 00:00  Vitamin D, 25-Hydroxy Latest Ref Range: 30.0 - 100.0 ng/mL 26.9 (L)    I, Michaelene Song, am acting as Location manager for [employer], DO  I have reviewed the above documentation for accuracy and completeness, and I agree with the above. -Jearld Lesch, DO

## 2018-11-01 NOTE — Telephone Encounter (Signed)
Pt will need uds at next ov

## 2018-11-07 ENCOUNTER — Encounter: Payer: Self-pay | Admitting: Medical

## 2018-11-07 ENCOUNTER — Ambulatory Visit (INDEPENDENT_AMBULATORY_CARE_PROVIDER_SITE_OTHER): Payer: Managed Care, Other (non HMO) | Admitting: Medical

## 2018-11-07 VITALS — Ht 67.0 in | Wt 214.0 lb

## 2018-11-07 DIAGNOSIS — Z20828 Contact with and (suspected) exposure to other viral communicable diseases: Secondary | ICD-10-CM

## 2018-11-07 DIAGNOSIS — Z20822 Contact with and (suspected) exposure to covid-19: Secondary | ICD-10-CM

## 2018-11-07 NOTE — Progress Notes (Signed)
Subjective:    Patient ID: Cheryl Patterson, female    DOB: 17-Dec-1970, 48 y.o.   MRN: [id number]  HPI  Virtual Visit via Video Note  I connected with Prabhjot Maddux [place]ada on 11/07/18 at  2:20 PM EDT by a video enabled telemedicine application and verified that I am speaking with the correct person using two identifiers.  Location: Patient: home Provider: office   I discussed the limitations of evaluation and management by telemedicine and the availability of in person appointments. The patient expressed understanding and agreed to proceed.  History of Present Illness:  Pt has appointment for exposure to her stepson who room mate tested + for covid. Her stepson came over yesterday and one day day last week. Her stepson is asymptomatic. Step son lives in [place].  Pt on review report no symptoms. See ros. She stats some hot flash sensation at night but she is going through menopause. She did mention mild runny nose but allergies in past and some in summer per pt.  Pt husband is getting tested for covid. He has nasal congestion.    Observations/Objective: General-no acute distress, pleasant, oriented. Lungs- on inspection lungs appear unlabored. Neck- no tracheal deviation or jvd on inspection. Neuro- gross motor function appears intact.  Assessment and Plan: You describe potential exposure to covid through close contact/relative. No obvious suspicious signs or symptoms. Do recommend check temp to see if fever as you report probable hot flashes but want to see if fever spiking?  Work excuse note sent to my chart. Explained testing process and return to work criteria.  If your symptoms change of worsen pending test results please let us know.  Follow up in 7-10 days or as needed  Mackie Pai, PA-C  Follow Up Instructions:    I discussed the assessment and treatment plan with the patient. The patient was provided an opportunity to ask questions and all were answered. The  patient agreed with the plan and demonstrated an understanding of the instructions.   The patient was advised to call back or seek an in-person evaluation if the symptoms worsen or if the condition fails to improve as anticipated.  I provided 25 minutes of non-face-to-face time during this encounter.   Mackie Pai, PA-C    Review of Systems  Constitutional: Negative for chills, fatigue and fever.  HENT: Positive for rhinorrhea. Negative for congestion, ear discharge, postnasal drip, sinus pressure, sinus pain, sneezing, sore throat and voice change.   Respiratory: Negative for cough, chest tightness and wheezing.   Cardiovascular: Negative for chest pain and palpitations.  Gastrointestinal: Negative for abdominal pain.  Endocrine: Negative for polydipsia and polyuria.  Musculoskeletal: Negative for back pain.  Neurological: Negative for dizziness, speech difficulty, weakness, numbness and headaches.  Hematological: Negative for adenopathy. Does not bruise/bleed easily.  Psychiatric/Behavioral: Negative for behavioral problems, confusion and sleep disturbance. The patient is not nervous/anxious.    Past Medical History:  Diagnosis Date  . ADD (attention deficit disorder)   . Allergy   . Anxiety   . Asthma    per pt- bronchitis brings on the symptoms  . Bipolar depression ([place])   . Chronic headaches   . Chronic kidney disease    kidney stones  . Constipation   . Depression    [facility]---suicidal ideation  . Dyspnea   . GERD (gastroesophageal reflux disease)   . Hyperlipidemia    no meds taken as of 04-20-16  . IBS (irritable bowel syndrome)   .  Joint pain   . Kidney infection   . Lactose intolerance   . Prediabetes   . Ruptured disc, thoracic   . Sepsis ([place])    2014-  d/t urinary infection  . UTI (urinary tract infection)   . Vitamin D deficiency      Social History   Socioeconomic History  . Marital status: Married    Spouse name: Octavia Bruckner  .  Number of children: 2  . Years of education: Not on file  . Highest education level: Not on file  Occupational History  . Occupation: Lobbyist: [employer]  . Financial resource strain: Not on file  . Food insecurity    Worry: Not on file    Inability: Not on file  . Transportation needs    Medical: Not on file    Non-medical: Not on file  Tobacco Use  . Smoking status: Never Smoker  . Smokeless tobacco: Never Used  Substance and Sexual Activity  . Alcohol use: No    Alcohol/week: 0.0 standard drinks  . Drug use: No  . Sexual activity: Yes    Partners: Male    Birth control/protection: None  Lifestyle  . Physical activity    Days per week: Not on file    Minutes per session: Not on file  . Stress: Not on file  Relationships  . Social Herbalist on phone: Not on file    Gets together: Not on file    Attends religious service: Not on file    Active member of club or organization: Not on file    Attends meetings of clubs or organizations: Not on file    Relationship status: Not on file  . Intimate partner violence    Fear of current or ex partner: Not on file    Emotionally abused: Not on file    Physically abused: Not on file    Forced sexual activity: Not on file  Other Topics Concern  . Not on file  Social History Narrative   Exercise--walk qd                    Video--  rare    Past Surgical History:  Procedure Laterality Date  . BREAST SURGERY     left breast lump removed  . COLONOSCOPY    . CYSTOSCOPY N/A 03/18/2015   Procedure: CYSTOSCOPY;  Surgeon: Terrance Mass, MD;  Location: [facility];  Service: Gynecology;  Laterality: N/A;  . KIDNEY STONE SURGERY    . LAPAROSCOPIC BILATERAL SALPINGECTOMY Bilateral 03/18/2015   Procedure: LAPAROSCOPIC BILATERAL SALPINGECTOMY;  Surgeon: Terrance Mass, MD;  Location: [facility];  Service: Gynecology;  Laterality: Bilateral;  . LAPAROSCOPIC VAGINAL HYSTERECTOMY  WITH SALPINGO OOPHORECTOMY N/A 03/18/2015   Procedure: LAPAROSCOPIC ASSISTED VAGINAL HYSTERECTOMY WITH SALPINGO OOPHORECTOMY;  Surgeon: Terrance Mass, MD;  Location: [facility];  Service: Gynecology;  Laterality: N/A;  . LEEP  2005  . LIPOMA EXCISION    . MOUTH SURGERY    . OTHER SURGICAL HISTORY     RENAL STONE REMOVAL  . TUBAL LIGATION      Family History  Problem Relation Age of Onset  . Colon cancer Maternal Aunt   . Cancer Maternal Aunt        colon  . Breast cancer Mother   . Colon cancer Mother 70  . Cancer Mother 12       colon  . Anxiety  disorder Mother   . Hypertension Father   . Hyperlipidemia Father   . Hypertension Other   . Stroke Paternal Grandmother   . Hypertension Paternal Grandmother   . Heart disease Paternal Grandmother   . Breast cancer Paternal Aunt   . Cancer Paternal Aunt        breast  . Uterine cancer Maternal Aunt   . Brain cancer Maternal Aunt   . Diabetes Maternal Grandfather   . Cancer Maternal Uncle        COLON  . Colon cancer Paternal Uncle   . Esophageal cancer Neg Hx   . Rectal cancer Neg Hx   . Stomach cancer Neg Hx     Allergies  Allergen Reactions  . Codeine     Itchy throat, whelps on skin  . Other Swelling and Rash    tomatoes    Current Outpatient Medications on File Prior to Visit  Medication Sig Dispense Refill  . Albuterol Sulfate (PROAIR RESPICLICK) 982 (90 Base) MCG/ACT AEPB Inhale 2 puffs into the lungs as needed.    Marland Kitchen amphetamine-dextroamphetamine (ADDERALL) 20 MG tablet Take 1 tablet (20 mg total) by mouth 2 (two) times daily. 60 tablet 0  . benzonatate (TESSALON) 100 MG capsule Take 1 capsule (100 mg total) by mouth 3 (three) times daily as needed for cough. 30 capsule 0  . fluticasone (FLONASE) 50 MCG/ACT nasal spray Place 2 sprays into both nostrils daily. 16 g 1  . loratadine (CLARITIN) 10 MG tablet Take 1 tablet (10 mg total) by mouth daily. 30 tablet 11  . metFORMIN (GLUCOPHAGE) 500 MG tablet Take 1 tablet  (500 mg total) by mouth daily with breakfast. 30 tablet 0  . Vitamin D, Ergocalciferol, (DRISDOL) 1.25 MG (50000 UT) CAPS capsule Take 1 capsule (50,000 Units total) by mouth every 7 (seven) days. 4 capsule 0   Current Facility-Administered Medications on File Prior to Visit  Medication Dose Route Frequency Provider Last Rate Last Dose  . 0.9 %  sodium chloride infusion  500 mL Intravenous Continuous Milus Banister, MD        Ht 5\' 7"  (1.702 m)   Wt 214 lb (97.1 kg)   LMP 12/02/2014   BMI 33.52 kg/m       Objective:   Physical Exam        Assessment & Plan:

## 2018-11-07 NOTE — Progress Notes (Unsigned)
Office: [phone]  /  Fax: [phone]    Date: November 08, 2018   Appointment Start Time:*** Duration:*** Provider: Glennie Isle, Psy.D. Type of Session: Individual Therapy  Location of Patient: *** Location of Provider: Provider's Home Type of Contact: Telepsychological Visit via [employer] WebEx   Session Content: Cheryl Patterson is a 48 y.o. female presenting via [place] for a follow-up appointment to address the previously established treatment goal of decreasing emotional eating. Today's appointment was a telepsychological visit, as this provider's clinic is seeing a limited number of patients for in-person visits due to COVID-19. Therapeutic services will resume to in-person appointments once deemed appropriate. Cheryl Patterson expressed understanding regarding the rationale for telepsychological services, and provided verbal consent for today's appointment. Prior to proceeding with today's appointment, Cheryl Patterson's physical location at the time of this appointment was obtained. Cheryl Patterson reported she was at *** and provided the address. In the event of technical difficulties, Cheryl Patterson shared a phone number she could be reached at. Cheryl Patterson and this provider participated in today's telepsychological service. Also, Cheryl Patterson denied anyone else being present in the room or on the WebEx appointment ***.  This provider conducted a brief check-in and verbally administered the PHQ-9 and GAD-7. *** A risk assessment was completed. Cheryl Patterson denied experiencing suicidal and homicidal ideation, plan, and intent since the last appointment with this provider. She reported she continues to have easy access to the developed safety plan, and continues to acknowledge understanding regarding the importance of reaching out to trusted individuals and/or emergency resources if she is unable to ensure safety. This provider checked-in on the status of the referral placed for longer-term therapeutic services.   Cheryl Patterson was  receptive to today's session as evidenced by openness to sharing, responsiveness to feedback, and ***.  Mental Status Examination:  Appearance: {Appearance:22431} Behavior: {Behavior:22445} Mood: {Teletherapy mood:22435} Affect: {Affect:22436} Speech: {Speech:22432} Eye Contact: {Eye Contact:22433} Psychomotor Activity: {Motor Activity:22434} Thought Process: {thought process:22448}  Content/Perceptual Disturbances: {disturbances:22451} Orientation: {Orientation:22437} Cognition/Sensorium: {gbcognition:22449} Insight: {Insight:22446} Judgment: {Insight:22446}  Structured Assessment Results: The Patient Health Questionnaire-9 (PHQ-9) is a self-report measure that assesses symptoms and severity of depression over the course of the last two weeks. Cheryl Patterson obtained a score of *** suggesting {GBPHQ9SEVERITY:21752}. Cheryl Patterson finds the endorsed symptoms to be {gbphq9difficulty:21754}. Little interest or pleasure in doing things ***  Feeling down, depressed, or hopeless ***  Trouble falling or staying asleep, or sleeping too much ***  Feeling tired or having little energy ***  Poor appetite or overeating ***  Feeling bad about yourself --- or that you are a failure or have let yourself or your family down ***  Trouble concentrating on things, such as reading the newspaper or watching television ***  Moving or speaking so slowly that other people could have noticed? Or the opposite --- being so fidgety or restless that you have been moving around a lot more than usual ***  Thoughts that you would be better off dead or hurting yourself in some way ***  PHQ-9 Score ***    The Generalized Anxiety Disorder-7 (GAD-7) is a brief self-report measure that assesses symptoms of anxiety over the course of the last two weeks. Cheryl Patterson obtained a score of *** suggesting {gbgad7severity:21753}. Cheryl Patterson finds the endorsed symptoms to be {gbphq9difficulty:21754}. 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 Cheryl Patterson has demonstrated progress in her goal as evidenced by ***  Plan: Cheryl Patterson continues to appear able and willing to participate as evidenced by engagement in reciprocal conversation, and asking questions for clarification as appropriate. The next appointment will be scheduled in {gbweeks:21758}, which will be via [employer]. Once this provider's office resumes in-person appointments and it is deemed appropriate, Cheryl Patterson will be notified. The next session will focus on reviewing learned skills, and working towards the established treatment goal.***

## 2018-11-07 NOTE — Patient Instructions (Signed)
You describe potential exposure to covid through close contact/relative. No obvious suspicious signs or symptoms. Do recommend check temp to see if fever as you report probable hot flashes but want to see if fever spiking?  Work excuse note sent to my chart. Explained testing process and return to work criteria.  If your symptoms change of worsen pending test results please let us know.  Follow up in 7-10 days or as needed

## 2018-11-08 ENCOUNTER — Encounter (INDEPENDENT_AMBULATORY_CARE_PROVIDER_SITE_OTHER): Payer: Self-pay

## 2018-11-08 ENCOUNTER — Other Ambulatory Visit: Payer: Self-pay

## 2018-11-08 ENCOUNTER — Ambulatory Visit (INDEPENDENT_AMBULATORY_CARE_PROVIDER_SITE_OTHER): Payer: Managed Care, Other (non HMO) | Admitting: Psychology

## 2018-11-08 DIAGNOSIS — Z20822 Contact with and (suspected) exposure to covid-19: Secondary | ICD-10-CM

## 2018-11-12 ENCOUNTER — Encounter: Payer: Self-pay | Admitting: Medical

## 2018-11-12 LAB — NOVEL CORONAVIRUS, NAA: SARS-CoV-2, NAA: NOT DETECTED

## 2018-11-14 ENCOUNTER — Other Ambulatory Visit: Payer: Self-pay

## 2018-11-14 ENCOUNTER — Encounter (INDEPENDENT_AMBULATORY_CARE_PROVIDER_SITE_OTHER): Payer: Self-pay | Admitting: Bariatrics

## 2018-11-14 ENCOUNTER — Telehealth (INDEPENDENT_AMBULATORY_CARE_PROVIDER_SITE_OTHER): Payer: Managed Care, Other (non HMO) | Admitting: Bariatrics

## 2018-11-14 DIAGNOSIS — R7303 Prediabetes: Secondary | ICD-10-CM | POA: Diagnosis not present

## 2018-11-14 DIAGNOSIS — E559 Vitamin D deficiency, unspecified: Secondary | ICD-10-CM

## 2018-11-14 DIAGNOSIS — Z6834 Body mass index (BMI) 34.0-34.9, adult: Secondary | ICD-10-CM | POA: Diagnosis not present

## 2018-11-14 DIAGNOSIS — E669 Obesity, unspecified: Secondary | ICD-10-CM

## 2018-11-15 NOTE — Progress Notes (Signed)
Office: [phone]  /  Fax: [phone] TeleHealth Visit:  Cheryl Patterson has verbally consented to this TeleHealth visit today. The patient is located at home, the provider is located at the [employer] [facility] office. The participants in this visit include the listed provider and patient and any and all parties involved. The visit was conducted today via WebEx.  HPI:   Chief Complaint: OBESITY Cheryl Patterson is here to discuss her progress with her obesity treatment plan. She is on the keep a food journal with 1200 to 1400 calories and 75 grams of protein daily plan and is following her eating plan approximately 75 % of the time. She states she is walking 2 to 2.7 miles 5 times per week. Cheryl Patterson states that her weight remains the same (weight 215 lbs). She has been walking every day, except the weekends. We were unable to weigh the patient today for this TeleHealth visit. She feels as if she has maintained weight since her last visit. She has lost 0 lbs since starting treatment with [place].  Vitamin D deficiency Cheryl Patterson has a diagnosis of vitamin D deficiency. Her last vitamin D level was at 26.9 She is currently taking vit D and denies nausea, vomiting or muscle weakness.  Pre-Diabetes Cheryl Patterson has a diagnosis of prediabetes based on her elevated Hgb A1c and was informed this puts her at greater risk of developing diabetes. Her last A1c was at 5.8 and last insulin level was at 16.7 She is taking metformin currently and continues to work on diet and exercise to decrease risk of diabetes. She denies nausea or hypoglycemia.  ASSESSMENT AND PLAN:  Vitamin D deficiency  Prediabetes  Class 1 obesity with serious comorbidity and body mass index (BMI) of 34.0 to 34.9 in adult, unspecified obesity type  PLAN:  Vitamin D Deficiency Cheryl Patterson was informed that low vitamin D levels contributes to fatigue and are associated with obesity, breast, and colon cancer. She will continue to take  prescription Vit D @50 ,000 IU every week and will follow up for routine testing of vitamin D, at least 2-3 times per year. She was informed of the risk of over-replacement of vitamin D and agrees to not increase her dose unless she discusses this with [place] first.  Pre-Diabetes Cheryl Patterson will continue to work on weight loss, exercise, and decreasing simple carbohydrates in her diet to help decrease the risk of diabetes. We dicussed metformin including benefits and risks. She was informed that eating too many simple carbohydrates or too many calories at one sitting increases the likelihood of GI side effects. Cheryl Patterson will continue metformin for now and a prescription was not written today. Cheryl Patterson agreed to follow up with [place] as directed to monitor her progress.  Obesity Cheryl Patterson is currently in the action stage of change. As such, her goal is to continue with weight loss efforts She has agreed to keep a food journal with 1200 to 1400 calories and 75 grams of protein daily Cheryl Patterson has been instructed to work up to a goal of 150 minutes of combined cardio and strengthening exercise per week for weight loss and overall health benefits. We discussed the following Behavioral Modification Strategies today: increase H2O intake, no skipping meals (will not skip meals), keeping healthy foods in the home, increasing lean protein intake, decreasing simple carbohydrates, increasing vegetables, decrease eating out and work on meal planning and intentional eating Breakfast, lunch and dinner were discussed. Calorie and protein goals for all meals were given.  Cheryl Patterson has agreed  to follow up with our clinic in 2 weeks. She was informed of the importance of frequent follow up visits to maximize her success with intensive lifestyle modifications for her multiple health conditions.  ALLERGIES: Allergies  Allergen Reactions  . Codeine     Itchy throat, whelps on skin  . Other Swelling and Rash    tomatoes     MEDICATIONS: Current Outpatient Medications on File Prior to Visit  Medication Sig Dispense Refill  . Albuterol Sulfate (PROAIR RESPICLICK) 678 (90 Base) MCG/ACT AEPB Inhale 2 puffs into the lungs as needed.    Marland Kitchen amphetamine-dextroamphetamine (ADDERALL) 20 MG tablet Take 1 tablet (20 mg total) by mouth 2 (two) times daily. 60 tablet 0  . benzonatate (TESSALON) 100 MG capsule Take 1 capsule (100 mg total) by mouth 3 (three) times daily as needed for cough. 30 capsule 0  . fluticasone (FLONASE) 50 MCG/ACT nasal spray Place 2 sprays into both nostrils daily. 16 g 1  . loratadine (CLARITIN) 10 MG tablet Take 1 tablet (10 mg total) by mouth daily. 30 tablet 11  . metFORMIN (GLUCOPHAGE) 500 MG tablet Take 1 tablet (500 mg total) by mouth daily with breakfast. 30 tablet 0  . Vitamin D, Ergocalciferol, (DRISDOL) 1.25 MG (50000 UT) CAPS capsule Take 1 capsule (50,000 Units total) by mouth every 7 (seven) days. 4 capsule 0   Current Facility-Administered Medications on File Prior to Visit  Medication Dose Route Frequency Provider Last Rate Last Dose  . 0.9 %  sodium chloride infusion  500 mL Intravenous Continuous Milus Banister, MD        PAST MEDICAL HISTORY: Past Medical History:  Diagnosis Date  . ADD (attention deficit disorder)   . Allergy   . Anxiety   . Asthma    per pt- bronchitis brings on the symptoms  . Bipolar depression ([place])   . Chronic headaches   . Chronic kidney disease    kidney stones  . Constipation   . Depression    [facility]---suicidal ideation  . Dyspnea   . GERD (gastroesophageal reflux disease)   . Hyperlipidemia    no meds taken as of 04-20-16  . IBS (irritable bowel syndrome)   . Joint pain   . Kidney infection   . Lactose intolerance   . Prediabetes   . Ruptured disc, thoracic   . Sepsis ([place])    2014-  d/t urinary infection  . UTI (urinary tract infection)   . Vitamin D deficiency     PAST SURGICAL HISTORY: Past Surgical History:   Procedure Laterality Date  . BREAST SURGERY     left breast lump removed  . COLONOSCOPY    . CYSTOSCOPY N/A 03/18/2015   Procedure: CYSTOSCOPY;  Surgeon: Terrance Mass, MD;  Location: [facility];  Service: Gynecology;  Laterality: N/A;  . KIDNEY STONE SURGERY    . LAPAROSCOPIC BILATERAL SALPINGECTOMY Bilateral 03/18/2015   Procedure: LAPAROSCOPIC BILATERAL SALPINGECTOMY;  Surgeon: Terrance Mass, MD;  Location: [facility];  Service: Gynecology;  Laterality: Bilateral;  . LAPAROSCOPIC VAGINAL HYSTERECTOMY WITH SALPINGO OOPHORECTOMY N/A 03/18/2015   Procedure: LAPAROSCOPIC ASSISTED VAGINAL HYSTERECTOMY WITH SALPINGO OOPHORECTOMY;  Surgeon: Terrance Mass, MD;  Location: [facility];  Service: Gynecology;  Laterality: N/A;  . LEEP  2005  . LIPOMA EXCISION    . MOUTH SURGERY    . OTHER SURGICAL HISTORY     RENAL STONE REMOVAL  . TUBAL LIGATION      SOCIAL HISTORY: Social History  Tobacco Use  . Smoking status: Never Smoker  . Smokeless tobacco: Never Used  Substance Use Topics  . Alcohol use: No    Alcohol/week: 0.0 standard drinks  . Drug use: No    FAMILY HISTORY: Family History  Problem Relation Age of Onset  . Colon cancer Maternal Aunt   . Cancer Maternal Aunt        colon  . Breast cancer Mother   . Colon cancer Mother 28  . Cancer Mother 68       colon  . Anxiety disorder Mother   . Hypertension Father   . Hyperlipidemia Father   . Hypertension Other   . Stroke Paternal Grandmother   . Hypertension Paternal Grandmother   . Heart disease Paternal Grandmother   . Breast cancer Paternal Aunt   . Cancer Paternal Aunt        breast  . Uterine cancer Maternal Aunt   . Brain cancer Maternal Aunt   . Diabetes Maternal Grandfather   . Cancer Maternal Uncle        COLON  . Colon cancer Paternal Uncle   . Esophageal cancer Neg Hx   . Rectal cancer Neg Hx   . Stomach cancer Neg Hx     ROS: Review of Systems  Constitutional: Negative for weight loss.   Gastrointestinal: Negative for nausea and vomiting.  Musculoskeletal:       Negative for muscle weakness  Endo/Heme/Allergies:       Negative for hypoglycemia    PHYSICAL EXAM: Pt in no acute distress  RECENT LABS AND TESTS: BMET    Component Value Date/Time   NA 137 09/12/2018 0000   K 4.7 09/12/2018 0000   CL 97 09/12/2018 0000   CO2 25 09/12/2018 0000   GLUCOSE 91 09/12/2018 0000   GLUCOSE 97 04/27/2018 1054   BUN 12 09/12/2018 0000   CREATININE 0.86 09/12/2018 0000   CREATININE 0.84 07/20/2016 0941   CALCIUM 9.6 09/12/2018 0000   GFRNONAA 80 09/12/2018 0000   GFRAA 92 09/12/2018 0000   Lab Results  Component Value Date   HGBA1C 5.8 (H) 09/12/2018   HGBA1C 6.0 04/27/2018   HGBA1C 5.8 (H) 11/23/2016   Lab Results  Component Value Date   INSULIN 16.7 09/12/2018   INSULIN 9.3 11/23/2016   CBC    Component Value Date/Time   WBC 6.3 09/12/2018 0000   WBC 6.1 04/27/2018 1054   RBC 4.80 09/12/2018 0000   RBC 4.62 04/27/2018 1054   HGB 13.6 09/12/2018 0000   HCT 41.1 09/12/2018 0000   PLT 282.0 04/27/2018 1054   MCV 86 09/12/2018 0000   MCH 28.3 09/12/2018 0000   MCH 28.3 07/20/2016 0941   MCHC 33.1 09/12/2018 0000   MCHC 33.4 04/27/2018 1054   RDW 12.6 09/12/2018 0000   LYMPHSABS 2.1 09/12/2018 0000   MONOABS 0.4 04/27/2018 1054   EOSABS 0.1 09/12/2018 0000   BASOSABS 0.0 09/12/2018 0000   Iron/TIBC/Ferritin/ %Sat    Component Value Date/Time   IRON 104 05/31/2008 0911   FERRITIN 33.9 05/31/2008 0911   IRONPCTSAT 27.3 05/31/2008 0911   Lipid Panel     Component Value Date/Time   CHOL 252 (H) 09/12/2018 0000   TRIG 59 09/12/2018 0000   HDL 50 09/12/2018 0000   CHOLHDL 5 04/27/2018 1054   VLDL 12.8 04/27/2018 1054   LDLCALC 190 (H) 09/12/2018 0000   LDLDIRECT 144.3 06/25/2010 0958   Hepatic Function Panel     Component Value Date/Time  PROT 7.2 09/12/2018 0000   ALBUMIN 4.4 09/12/2018 0000   AST 18 09/12/2018 0000   ALT 18 09/12/2018 0000    ALKPHOS 103 09/12/2018 0000   BILITOT 0.2 09/12/2018 0000   BILIDIR 0.1 08/23/2014 1020      Component Value Date/Time   TSH 1.790 09/12/2018 0000   TSH 1.60 04/27/2018 1054   TSH 1.16 10/24/2017 1108     Ref. Range 09/12/2018 00:00  Vitamin D, 25-Hydroxy Latest Ref Range: 30.0 - 100.0 ng/mL 26.9 (L)    I, Doreene Nest, am acting as Location manager for [employer]. Owens Shark, DO  I have reviewed the above documentation for accuracy and completeness, and I agree with the above. -Jearld Lesch, DO

## 2018-11-25 ENCOUNTER — Other Ambulatory Visit (INDEPENDENT_AMBULATORY_CARE_PROVIDER_SITE_OTHER): Payer: Self-pay | Admitting: Bariatrics

## 2018-11-25 DIAGNOSIS — R7303 Prediabetes: Secondary | ICD-10-CM

## 2018-11-28 ENCOUNTER — Other Ambulatory Visit: Payer: Self-pay

## 2018-11-28 ENCOUNTER — Telehealth (INDEPENDENT_AMBULATORY_CARE_PROVIDER_SITE_OTHER): Payer: Managed Care, Other (non HMO) | Admitting: Bariatrics

## 2018-11-28 DIAGNOSIS — Z6834 Body mass index (BMI) 34.0-34.9, adult: Secondary | ICD-10-CM

## 2018-11-28 DIAGNOSIS — E559 Vitamin D deficiency, unspecified: Secondary | ICD-10-CM

## 2018-11-28 DIAGNOSIS — E669 Obesity, unspecified: Secondary | ICD-10-CM

## 2018-11-28 DIAGNOSIS — R7303 Prediabetes: Secondary | ICD-10-CM

## 2018-11-28 MED ORDER — VITAMIN D (ERGOCALCIFEROL) 1.25 MG (50000 UNIT) PO CAPS: 50000.0000 [IU] | ORAL_CAPSULE | ORAL | 0 refills | Status: DC

## 2018-11-30 NOTE — Progress Notes (Signed)
Office: [phone]  /  Fax: [phone] TeleHealth Visit:  Cheryl Patterson has verbally consented to this TeleHealth visit today. The patient is located at home, the provider is located at the [employer] [facility] office. The participants in this visit include the listed provider and patient and any and all parties involved. The visit was conducted today via WebEx.  HPI:   Chief Complaint: OBESITY Cheryl Patterson is here to discuss her progress with her obesity treatment plan. She is on the keep a food journal with 1400 calories and 75 grams of protein daily plan and she is following her eating plan approximately 75 % of the time. She states she is walking 2 to 2.7 miles 5 times per week. Cheryl Patterson states that she has lost 2 1/2 pounds since her last visit (weight 213 lbs last week). She is doing okay with her protein and water. Cheryl Patterson does "[employer]".  We were unable to weigh the patient today for this TeleHealth visit. She feels as if she has lost weight since her last visit. She has lost 0 lbs since starting treatment with [place].  Pre-Diabetes Cheryl Patterson has a diagnosis of prediabetes based on her elevated Hgb A1c and was informed this puts her at greater risk of developing diabetes. Her last A1c was at 5.8 and last insulin level was at 16.7 She is taking metformin currently and continues to work on diet and exercise to decrease risk of diabetes. She denies nausea or hypoglycemia.  Vitamin D deficiency Cheryl Patterson has a diagnosis of vitamin D deficiency. Her last vitamin D level was at 26.9 She is currently taking vit D and denies nausea, vomiting or muscle weakness.  ASSESSMENT AND PLAN:  Prediabetes  Vitamin D deficiency - Plan: Vitamin D, Ergocalciferol, (DRISDOL) 1.25 MG (50000 UT) CAPS capsule  Class 1 obesity with serious comorbidity and body mass index (BMI) of 34.0 to 34.9 in adult, unspecified obesity type  PLAN:  Pre-Diabetes Cheryl Patterson will continue to work on weight loss,  exercise, and decreasing simple carbohydrates in her diet to help decrease the risk of diabetes. We dicussed metformin including benefits and risks. She was informed that eating too many simple carbohydrates or too many calories at one sitting increases the likelihood of GI side effects. Cheryl Patterson will continue metformin for now and a prescription was not written today. Cheryl Patterson agreed to follow up with [place] as directed to monitor her progress.  Vitamin D Deficiency Cheryl Patterson was informed that low vitamin D levels contributes to fatigue and are associated with obesity, breast, and colon cancer. She agrees to continue to take prescription Vit D @50 ,000 IU every week #4 with no refills and she will follow up for routine testing of vitamin D, at least 2-3 times per year. She was informed of the risk of over-replacement of vitamin D and agrees to not increase her dose unless she discusses this with [place] first. Cheryl Patterson agrees to follow up with our clinic in 2 weeks.  Obesity Cheryl Patterson is currently in the action stage of change. As such, her goal is to continue with weight loss efforts She has agreed to keep a food journal with 1400 calories and 75 grams of protein daily Cheryl Patterson will continue to walk and she will consider some stomach exercises for weight loss and overall health benefits. We discussed the following Behavioral Modification Strategies today: increase H2O intake, no skipping meals, keeping healthy foods in the home, increasing lean protein intake, decreasing simple carbohydrates, increasing vegetables, decrease eating out and work  on meal planning and intentional eating Cheryl Patterson will make good decisions for eating out.  Cheryl Patterson has agreed to follow up with our clinic in 2 weeks. She was informed of the importance of frequent follow up visits to maximize her success with intensive lifestyle modifications for her multiple health conditions.  ALLERGIES: Allergies  Allergen Reactions  . Codeine      Itchy throat, whelps on skin  . Other Swelling and Rash    tomatoes    MEDICATIONS: Current Outpatient Medications on File Prior to Visit  Medication Sig Dispense Refill  . Albuterol Sulfate (PROAIR RESPICLICK) 174 (90 Base) MCG/ACT AEPB Inhale 2 puffs into the lungs as needed.    Marland Kitchen amphetamine-dextroamphetamine (ADDERALL) 20 MG tablet Take 1 tablet (20 mg total) by mouth 2 (two) times daily. 60 tablet 0  . benzonatate (TESSALON) 100 MG capsule Take 1 capsule (100 mg total) by mouth 3 (three) times daily as needed for cough. 30 capsule 0  . fluticasone (FLONASE) 50 MCG/ACT nasal spray Place 2 sprays into both nostrils daily. 16 g 1  . loratadine (CLARITIN) 10 MG tablet Take 1 tablet (10 mg total) by mouth daily. 30 tablet 11  . metFORMIN (GLUCOPHAGE) 500 MG tablet Take 1 tablet (500 mg total) by mouth daily with breakfast. 30 tablet 0   Current Facility-Administered Medications on File Prior to Visit  Medication Dose Route Frequency Provider Last Rate Last Dose  . 0.9 %  sodium chloride infusion  500 mL Intravenous Continuous Milus Banister, MD        PAST MEDICAL HISTORY: Past Medical History:  Diagnosis Date  . ADD (attention deficit disorder)   . Allergy   . Anxiety   . Asthma    per pt- bronchitis brings on the symptoms  . Bipolar depression ([place])   . Chronic headaches   . Chronic kidney disease    kidney stones  . Constipation   . Depression    [facility]---suicidal ideation  . Dyspnea   . GERD (gastroesophageal reflux disease)   . Hyperlipidemia    no meds taken as of 04-20-16  . IBS (irritable bowel syndrome)   . Joint pain   . Kidney infection   . Lactose intolerance   . Prediabetes   . Ruptured disc, thoracic   . Sepsis ([place])    2014-  d/t urinary infection  . UTI (urinary tract infection)   . Vitamin D deficiency     PAST SURGICAL HISTORY: Past Surgical History:  Procedure Laterality Date  . BREAST SURGERY     left breast lump removed  .  COLONOSCOPY    . CYSTOSCOPY N/A 03/18/2015   Procedure: CYSTOSCOPY;  Surgeon: Terrance Mass, MD;  Location: [facility];  Service: Gynecology;  Laterality: N/A;  . KIDNEY STONE SURGERY    . LAPAROSCOPIC BILATERAL SALPINGECTOMY Bilateral 03/18/2015   Procedure: LAPAROSCOPIC BILATERAL SALPINGECTOMY;  Surgeon: Terrance Mass, MD;  Location: [facility];  Service: Gynecology;  Laterality: Bilateral;  . LAPAROSCOPIC VAGINAL HYSTERECTOMY WITH SALPINGO OOPHORECTOMY N/A 03/18/2015   Procedure: LAPAROSCOPIC ASSISTED VAGINAL HYSTERECTOMY WITH SALPINGO OOPHORECTOMY;  Surgeon: Terrance Mass, MD;  Location: [facility];  Service: Gynecology;  Laterality: N/A;  . LEEP  2005  . LIPOMA EXCISION    . MOUTH SURGERY    . OTHER SURGICAL HISTORY     RENAL STONE REMOVAL  . TUBAL LIGATION      SOCIAL HISTORY: Social History   Tobacco Use  . Smoking status: Never  Smoker  . Smokeless tobacco: Never Used  Substance Use Topics  . Alcohol use: No    Alcohol/week: 0.0 standard drinks  . Drug use: No    FAMILY HISTORY: Family History  Problem Relation Age of Onset  . Colon cancer Maternal Aunt   . Cancer Maternal Aunt        colon  . Breast cancer Mother   . Colon cancer Mother 40  . Cancer Mother 55       colon  . Anxiety disorder Mother   . Hypertension Father   . Hyperlipidemia Father   . Hypertension Other   . Stroke Paternal Grandmother   . Hypertension Paternal Grandmother   . Heart disease Paternal Grandmother   . Breast cancer Paternal Aunt   . Cancer Paternal Aunt        breast  . Uterine cancer Maternal Aunt   . Brain cancer Maternal Aunt   . Diabetes Maternal Grandfather   . Cancer Maternal Uncle        COLON  . Colon cancer Paternal Uncle   . Esophageal cancer Neg Hx   . Rectal cancer Neg Hx   . Stomach cancer Neg Hx     ROS: Review of Systems  Constitutional: Positive for weight loss.  Gastrointestinal: Negative for nausea and vomiting.  Musculoskeletal:       Negative for  muscle weakness  Endo/Heme/Allergies:       Negative for hypoglycemia    PHYSICAL EXAM: Pt in no acute distress  RECENT LABS AND TESTS: BMET    Component Value Date/Time   NA 137 09/12/2018 0000   K 4.7 09/12/2018 0000   CL 97 09/12/2018 0000   CO2 25 09/12/2018 0000   GLUCOSE 91 09/12/2018 0000   GLUCOSE 97 04/27/2018 1054   BUN 12 09/12/2018 0000   CREATININE 0.86 09/12/2018 0000   CREATININE 0.84 07/20/2016 0941   CALCIUM 9.6 09/12/2018 0000   GFRNONAA 80 09/12/2018 0000   GFRAA 92 09/12/2018 0000   Lab Results  Component Value Date   HGBA1C 5.8 (H) 09/12/2018   HGBA1C 6.0 04/27/2018   HGBA1C 5.8 (H) 11/23/2016   Lab Results  Component Value Date   INSULIN 16.7 09/12/2018   INSULIN 9.3 11/23/2016   CBC    Component Value Date/Time   WBC 6.3 09/12/2018 0000   WBC 6.1 04/27/2018 1054   RBC 4.80 09/12/2018 0000   RBC 4.62 04/27/2018 1054   HGB 13.6 09/12/2018 0000   HCT 41.1 09/12/2018 0000   PLT 282.0 04/27/2018 1054   MCV 86 09/12/2018 0000   MCH 28.3 09/12/2018 0000   MCH 28.3 07/20/2016 0941   MCHC 33.1 09/12/2018 0000   MCHC 33.4 04/27/2018 1054   RDW 12.6 09/12/2018 0000   LYMPHSABS 2.1 09/12/2018 0000   MONOABS 0.4 04/27/2018 1054   EOSABS 0.1 09/12/2018 0000   BASOSABS 0.0 09/12/2018 0000   Iron/TIBC/Ferritin/ %Sat    Component Value Date/Time   IRON 104 05/31/2008 0911   FERRITIN 33.9 05/31/2008 0911   IRONPCTSAT 27.3 05/31/2008 0911   Lipid Panel     Component Value Date/Time   CHOL 252 (H) 09/12/2018 0000   TRIG 59 09/12/2018 0000   HDL 50 09/12/2018 0000   CHOLHDL 5 04/27/2018 1054   VLDL 12.8 04/27/2018 1054   LDLCALC 190 (H) 09/12/2018 0000   LDLDIRECT 144.3 06/25/2010 0958   Hepatic Function Panel     Component Value Date/Time   PROT 7.2 09/12/2018 0000  ALBUMIN 4.4 09/12/2018 0000   AST 18 09/12/2018 0000   ALT 18 09/12/2018 0000   ALKPHOS 103 09/12/2018 0000   BILITOT 0.2 09/12/2018 0000   BILIDIR 0.1 08/23/2014  1020      Component Value Date/Time   TSH 1.790 09/12/2018 0000   TSH 1.60 04/27/2018 1054   TSH 1.16 10/24/2017 1108     Ref. Range 09/12/2018 00:00  Vitamin D, 25-Hydroxy Latest Ref Range: 30.0 - 100.0 ng/mL 26.9 (L)   I, Doreene Nest, am acting as Location manager for [employer]. Owens Shark, DO  I have reviewed the above documentation for accuracy and completeness, and I agree with the above. -Jearld Lesch, DO

## 2018-12-06 ENCOUNTER — Encounter (INDEPENDENT_AMBULATORY_CARE_PROVIDER_SITE_OTHER): Payer: Self-pay

## 2018-12-11 ENCOUNTER — Other Ambulatory Visit: Payer: Self-pay

## 2018-12-11 ENCOUNTER — Ambulatory Visit (INDEPENDENT_AMBULATORY_CARE_PROVIDER_SITE_OTHER): Payer: Managed Care, Other (non HMO) | Admitting: Bariatrics

## 2018-12-11 VITALS — BP 131/72 | HR 93 | Temp 98.2°F | Ht 67.0 in | Wt 215.0 lb

## 2018-12-11 DIAGNOSIS — Z9189 Other specified personal risk factors, not elsewhere classified: Secondary | ICD-10-CM | POA: Diagnosis not present

## 2018-12-11 DIAGNOSIS — F3289 Other specified depressive episodes: Secondary | ICD-10-CM | POA: Diagnosis not present

## 2018-12-11 DIAGNOSIS — R7303 Prediabetes: Secondary | ICD-10-CM

## 2018-12-11 DIAGNOSIS — E559 Vitamin D deficiency, unspecified: Secondary | ICD-10-CM | POA: Diagnosis not present

## 2018-12-11 DIAGNOSIS — Z6833 Body mass index (BMI) 33.0-33.9, adult: Secondary | ICD-10-CM

## 2018-12-11 DIAGNOSIS — E669 Obesity, unspecified: Secondary | ICD-10-CM

## 2018-12-11 MED ORDER — BUPROPION HCL ER (SR) 150 MG PO TB12: 150.0000 mg | ORAL_TABLET | Freq: Every day | ORAL | 0 refills | Status: DC

## 2018-12-11 MED ORDER — METFORMIN HCL 500 MG PO TABS: 500.0000 mg | ORAL_TABLET | Freq: Every day | ORAL | 0 refills | Status: DC

## 2018-12-13 NOTE — Progress Notes (Signed)
Office: [phone]  /  Fax: [phone]   HPI:   Chief Complaint: OBESITY Cheryl Patterson is here to discuss her progress with her obesity treatment plan. She is on the keep a food journal with 1400 calories and 75 grams of protein daily plan and she is following her eating plan approximately 40 % of the time. She states she is walking 40 to 60 minutes 5 times per week. Cheryl Patterson Patterson has had a weight gain of 1 pound since her last in-office visit, and she is at a plateau. Cheryl Patterson Patterson will cook differently. Her weight is 215 lb (97.5 kg) today and she has gained 1 pound since her last in-office. She has gained 12 lbs since starting treatment with [place].  Pre-Diabetes Cheryl Patterson Patterson has a diagnosis of prediabetes based on her elevated Hgb A1c and was informed this puts her at greater risk of developing diabetes. Her last A1c was at 5.8 and last insulin level was at 16.7 She is taking metformin currently and continues to work on diet and exercise to decrease risk of diabetes. She denies nausea or hypoglycemia.  At risk for diabetes Cheryl Patterson is at higher than average risk for developing diabetes due to her obesity and prediabetes. She currently denies polyuria or polydipsia.  Vitamin D deficiency Cheryl Patterson Patterson has a diagnosis of vitamin D deficiency. She is currently taking vit D and denies nausea, vomiting or muscle weakness.  Depression with emotional eating behaviors Cheryl Patterson Patterson is struggling with emotional eating and using food for comfort to the extent that it is negatively impacting her health. She often snacks when she is not hungry. Cheryl Patterson Patterson sometimes feels she is out of control and then feels guilty that she made poor food choices. Cheryl Patterson Patterson has a craving for sweets. She has a history of kidney stones. She has been working on behavior modification techniques to help reduce her emotional eating and has been somewhat successful. She shows no sign of suicidal or homicidal ideations.  ASSESSMENT AND PLAN:   Prediabetes - Plan: metFORMIN (GLUCOPHAGE) 500 MG tablet  Vitamin D deficiency  Other depression - with emotional eating - Plan: buPROPion (WELLBUTRIN SR) 150 MG 12 hr tablet  At risk for diabetes mellitus  Class 1 obesity with serious comorbidity and body mass index (BMI) of 33.0 to 33.9 in adult, unspecified obesity type  PLAN:  Pre-Diabetes Cheryl Patterson will continue to work on weight loss, exercise, and decreasing simple carbohydrates in her diet to help decrease the risk of diabetes. We dicussed metformin including benefits and risks. She was informed that eating too many simple carbohydrates or too many calories at one sitting increases the likelihood of GI side effects. Eiman agrees to take metformin 500 mg once daily at lunchtime #30 with no refills and follow up with [place] as directed to monitor her progress.  Diabetes risk counseling Cheryl Patterson Patterson was given extended (15 minutes) diabetes prevention counseling today. She is 48 y.o. female and has risk factors for diabetes including obesity and prediabetes. We discussed intensive lifestyle modifications today with an emphasis on weight loss as well as increasing exercise and decreasing simple carbohydrates in her diet.  Vitamin D Deficiency Cheryl Patterson was informed that low vitamin D levels contributes to fatigue and are associated with obesity, breast, and colon cancer. She agrees to continue to take prescription Vit D @50 ,000 IU every week and will follow up for routine testing of vitamin D, at least 2-3 times per year. She was informed of the risk of over-replacement of vitamin D and agrees to not increase her  dose unless she discusses this with [place] first.  Depression with Emotional Eating Behaviors We discussed behavior modification techniques today to help Cheryl Patterson Patterson deal with her emotional eating and depression. She has agreed to take Wellbutrin SR 150 mg once daily #30 with no refills and follow up as directed.  Obesity Cheryl Patterson Patterson is  currently in the action stage of change. As such, her goal is to continue with weight loss efforts She has agreed to keep a food journal with 1400 calories and 75 grams of protein daily Mistey will continue walking for weight loss and overall health benefits. We discussed the following Behavioral Modification Strategies today: increase H2O intake, no skipping meals, keeping healthy foods in the home, increasing lean protein intake, decreasing simple carbohydrates, increasing vegetables, decrease eating out and work on meal planning and intentional eating Cheryl Patterson will stop "Hello Fresh" and she will do more vegetables and meat.  Cheryl Patterson has agreed to follow up with our clinic in 3 to 4 weeks. She was informed of the importance of frequent follow up visits to maximize her success with intensive lifestyle modifications for her multiple health conditions.  ALLERGIES: Allergies  Allergen Reactions  . Codeine     Itchy throat, whelps on skin  . Other Swelling and Rash    tomatoes    MEDICATIONS: Current Outpatient Medications on File Prior to Visit  Medication Sig Dispense Refill  . Albuterol Sulfate (PROAIR RESPICLICK) 123XX123 (90 Base) MCG/ACT AEPB Inhale 2 puffs into the lungs as needed.    Marland Kitchen amphetamine-dextroamphetamine (ADDERALL) 20 MG tablet Take 1 tablet (20 mg total) by mouth 2 (two) times daily. 60 tablet 0  . benzonatate (TESSALON) 100 MG capsule Take 1 capsule (100 mg total) by mouth 3 (three) times daily as needed for cough. 30 capsule 0  . fluticasone (FLONASE) 50 MCG/ACT nasal spray Place 2 sprays into both nostrils daily. 16 g 1  . loratadine (CLARITIN) 10 MG tablet Take 1 tablet (10 mg total) by mouth daily. 30 tablet 11  . Vitamin D, Ergocalciferol, (DRISDOL) 1.25 MG (50000 UT) CAPS capsule Take 1 capsule (50,000 Units total) by mouth every 7 (seven) days. 4 capsule 0   Current Facility-Administered Medications on File Prior to Visit  Medication Dose Route Frequency Provider  Last Rate Last Dose  . 0.9 %  sodium chloride infusion  500 mL Intravenous Continuous Milus Banister, MD        PAST MEDICAL HISTORY: Past Medical History:  Diagnosis Date  . ADD (attention deficit disorder)   . Allergy   . Anxiety   . Asthma    per pt- bronchitis brings on the symptoms  . Bipolar depression ([place])   . Chronic headaches   . Chronic kidney disease    kidney stones  . Constipation   . Depression    [facility]---suicidal ideation  . Dyspnea   . GERD (gastroesophageal reflux disease)   . Hyperlipidemia    no meds taken as of 04-20-16  . IBS (irritable bowel syndrome)   . Joint pain   . Kidney infection   . Lactose intolerance   . Prediabetes   . Ruptured disc, thoracic   . Sepsis ([place])    2014-  d/t urinary infection  . UTI (urinary tract infection)   . Vitamin D deficiency     PAST SURGICAL HISTORY: Past Surgical History:  Procedure Laterality Date  . BREAST SURGERY     left breast lump removed  . COLONOSCOPY    .  CYSTOSCOPY N/A 03/18/2015   Procedure: CYSTOSCOPY;  Surgeon: Terrance Mass, MD;  Location: [facility];  Service: Gynecology;  Laterality: N/A;  . KIDNEY STONE SURGERY    . LAPAROSCOPIC BILATERAL SALPINGECTOMY Bilateral 03/18/2015   Procedure: LAPAROSCOPIC BILATERAL SALPINGECTOMY;  Surgeon: Terrance Mass, MD;  Location: [facility];  Service: Gynecology;  Laterality: Bilateral;  . LAPAROSCOPIC VAGINAL HYSTERECTOMY WITH SALPINGO OOPHORECTOMY N/A 03/18/2015   Procedure: LAPAROSCOPIC ASSISTED VAGINAL HYSTERECTOMY WITH SALPINGO OOPHORECTOMY;  Surgeon: Terrance Mass, MD;  Location: [facility];  Service: Gynecology;  Laterality: N/A;  . LEEP  2005  . LIPOMA EXCISION    . MOUTH SURGERY    . OTHER SURGICAL HISTORY     RENAL STONE REMOVAL  . TUBAL LIGATION      SOCIAL HISTORY: Social History   Tobacco Use  . Smoking status: Never Smoker  . Smokeless tobacco: Never Used  Substance Use Topics  . Alcohol use: No    Alcohol/week: 0.0  standard drinks  . Drug use: No    FAMILY HISTORY: Family History  Problem Relation Age of Onset  . Colon cancer Maternal Aunt   . Cancer Maternal Aunt        colon  . Breast cancer Mother   . Colon cancer Mother 61  . Cancer Mother 32       colon  . Anxiety disorder Mother   . Hypertension Father   . Hyperlipidemia Father   . Hypertension Other   . Stroke Paternal Grandmother   . Hypertension Paternal Grandmother   . Heart disease Paternal Grandmother   . Breast cancer Paternal Aunt   . Cancer Paternal Aunt        breast  . Uterine cancer Maternal Aunt   . Brain cancer Maternal Aunt   . Diabetes Maternal Grandfather   . Cancer Maternal Uncle        COLON  . Colon cancer Paternal Uncle   . Esophageal cancer Neg Hx   . Rectal cancer Neg Hx   . Stomach cancer Neg Hx     ROS: Review of Systems  Constitutional: Negative for weight loss.  Gastrointestinal: Negative for nausea and vomiting.  Genitourinary: Negative for frequency.  Musculoskeletal:       Negative for muscle weakness  Endo/Heme/Allergies: Negative for polydipsia.       Negative for hypoglycemia  Psychiatric/Behavioral: Positive for depression. Negative for suicidal ideas.    PHYSICAL EXAM: Blood pressure 131/72, pulse 93, temperature 98.2 F (36.8 C), temperature source Oral, height 5\' 7"  (1.702 m), weight 215 lb (97.5 kg), last menstrual period 12/02/2014, SpO2 98 %. Body mass index is 33.67 kg/m. Physical Exam Vitals signs reviewed.  Constitutional:      Appearance: Normal appearance. She is well-developed. She is obese.  Cardiovascular:     Rate and Rhythm: Normal rate.  Pulmonary:     Effort: Pulmonary effort is normal.  Musculoskeletal: Normal range of motion.  Skin:    General: Skin is warm and dry.  Neurological:     Mental Status: She is alert and oriented to person, place, and time.  Psychiatric:        Mood and Affect: Mood normal.        Behavior: Behavior normal.        Thought  Content: Thought content does not include homicidal or suicidal ideation.     RECENT LABS AND TESTS: BMET    Component Value Date/Time   NA 137 09/12/2018 0000   K 4.7  09/12/2018 0000   CL 97 09/12/2018 0000   CO2 25 09/12/2018 0000   GLUCOSE 91 09/12/2018 0000   GLUCOSE 97 04/27/2018 1054   BUN 12 09/12/2018 0000   CREATININE 0.86 09/12/2018 0000   CREATININE 0.84 07/20/2016 0941   CALCIUM 9.6 09/12/2018 0000   GFRNONAA 80 09/12/2018 0000   GFRAA 92 09/12/2018 0000   Lab Results  Component Value Date   HGBA1C 5.8 (H) 09/12/2018   HGBA1C 6.0 04/27/2018   HGBA1C 5.8 (H) 11/23/2016   Lab Results  Component Value Date   INSULIN 16.7 09/12/2018   INSULIN 9.3 11/23/2016   CBC    Component Value Date/Time   WBC 6.3 09/12/2018 0000   WBC 6.1 04/27/2018 1054   RBC 4.80 09/12/2018 0000   RBC 4.62 04/27/2018 1054   HGB 13.6 09/12/2018 0000   HCT 41.1 09/12/2018 0000   PLT 282.0 04/27/2018 1054   MCV 86 09/12/2018 0000   MCH 28.3 09/12/2018 0000   MCH 28.3 07/20/2016 0941   MCHC 33.1 09/12/2018 0000   MCHC 33.4 04/27/2018 1054   RDW 12.6 09/12/2018 0000   LYMPHSABS 2.1 09/12/2018 0000   MONOABS 0.4 04/27/2018 1054   EOSABS 0.1 09/12/2018 0000   BASOSABS 0.0 09/12/2018 0000   Iron/TIBC/Ferritin/ %Sat    Component Value Date/Time   IRON 104 05/31/2008 0911   FERRITIN 33.9 05/31/2008 0911   IRONPCTSAT 27.3 05/31/2008 0911   Lipid Panel     Component Value Date/Time   CHOL 252 (H) 09/12/2018 0000   TRIG 59 09/12/2018 0000   HDL 50 09/12/2018 0000   CHOLHDL 5 04/27/2018 1054   VLDL 12.8 04/27/2018 1054   LDLCALC 190 (H) 09/12/2018 0000   LDLDIRECT 144.3 06/25/2010 0958   Hepatic Function Panel     Component Value Date/Time   PROT 7.2 09/12/2018 0000   ALBUMIN 4.4 09/12/2018 0000   AST 18 09/12/2018 0000   ALT 18 09/12/2018 0000   ALKPHOS 103 09/12/2018 0000   BILITOT 0.2 09/12/2018 0000   BILIDIR 0.1 08/23/2014 1020      Component Value Date/Time    TSH 1.790 09/12/2018 0000   TSH 1.60 04/27/2018 1054   TSH 1.16 10/24/2017 1108     Ref. Range 09/12/2018 00:00  Vitamin D, 25-Hydroxy Latest Ref Range: 30.0 - 100.0 ng/mL 26.9 (L)    OBESITY BEHAVIORAL INTERVENTION VISIT  Today's visit was # 10   Starting weight: 203 lbs Starting date: 11/23/2016 Today's weight : 215 lbs Today's date: 12/11/2018 Total lbs lost to date: 0    12/11/2018  Height 5\' 7"  (1.702 m)  Weight 215 lb (97.5 kg)  BMI (Calculated) 33.67  BLOOD PRESSURE - SYSTOLIC [id number]  BLOOD PRESSURE - DIASTOLIC 72   Body Fat % 99991111 %  Total Body Water (lbs) 82.6 lbs    ASK: We discussed the diagnosis of obesity with Cheryl Patterson Patterson today and Cheryl Patterson agreed to give [place] permission to discuss obesity behavioral modification therapy today.  ASSESS: Cheryl Patterson Patterson has the diagnosis of obesity and her BMI today is 33.67 Cheryl Patterson is in the action stage of change   ADVISE: Cheryl Patterson was educated on the multiple health risks of obesity as well as the benefit of weight loss to improve her health. She was advised of the need for long term treatment and the importance of lifestyle modifications to improve her current health and to decrease her risk of future health problems.  AGREE: Multiple dietary modification options and treatment options were discussed  and  Cheryl Patterson agreed to follow the recommendations documented in the above note.  ARRANGE: Cheryl Patterson Patterson was educated on the importance of frequent visits to treat obesity as outlined per CMS and USPSTF guidelines and agreed to schedule her next follow up appointment today.  Corey Skains, am acting as Location manager for [employer]. Owens Shark, DO  I have reviewed the above documentation for accuracy and completeness, and I agree with the above. -Jearld Lesch, DO

## 2018-12-14 ENCOUNTER — Encounter (INDEPENDENT_AMBULATORY_CARE_PROVIDER_SITE_OTHER): Payer: Self-pay | Admitting: Bariatrics

## 2018-12-18 NOTE — Telephone Encounter (Signed)
Please review

## 2018-12-23 ENCOUNTER — Other Ambulatory Visit (INDEPENDENT_AMBULATORY_CARE_PROVIDER_SITE_OTHER): Payer: Self-pay | Admitting: Bariatrics

## 2018-12-23 DIAGNOSIS — E559 Vitamin D deficiency, unspecified: Secondary | ICD-10-CM

## 2018-12-26 NOTE — Progress Notes (Addendum)
Office: [phone]  /  Fax: [phone]    Date: January 03, 2019   Appointment Start Time: 10:30am Duration: 27 minutes Provider: Glennie Isle, Psy.D. Type of Session: Individual Therapy  Location of Patient: Home Location of Provider: Provider's Home Type of Contact: Telepsychological Visit via [employer] WebEx   Session Content: Remonica is a 48 y.o. female presenting via [place] for a follow-up appointment to address the previously established treatment goal of decreasing emotional eating. Today's appointment was a telepsychological visit, as this provider's clinic is seeing a limited number of patients for in-person visits due to COVID-19. Therapeutic services will resume to in-person appointments once deemed appropriate. Dereka expressed understanding regarding the rationale for telepsychological services, and provided verbal consent for today's appointment. Prior to proceeding with today's appointment, Masiya's physical location at the time of this appointment was obtained. Shanterra reported she was at home and provided the address. In the event of technical difficulties, Kalon shared a phone number she could be reached at. Velera and this provider participated in today's telepsychological service. Also, Torrence denied anyone else being present in the room or on the WebEx appointment.  This provider conducted a brief check-in and verbally administered the PHQ-9 and GAD-7. A risk assessment was completed. Ikeya denied experiencing suicidal and homicidal ideation, plan, and intent since the last appointment with this provider. She reported she continues to have easy access to the developed safety plan, and continues to acknowledge understanding regarding the importance of reaching out to trusted individuals and/or emergency resources if she is unable to ensure safety. Buffey reported she has been focusing on work and planning for her trip to [place] as her grandson will be  born in a few weeks. Bathsheba shared she was sick recently and attributed endorsed items on PHQ-9 and GAD-7 to not feeling well as well as current events. Notably, regarding the referral placed by this provider, Sharyn Lull indicated a plan to call [employer] to schedule an appointment.  Since the last appointment with this provider, Roshonda reported a fluctuation in appetite as she was not feeling well. She noted she continues to "struggle" with snacking at night and noted she ends up eating something sweet. It was reflected the aforementioned appears to be out of habit eating; she agreed. Naquita indicated she discussed the aforementioned with Dr. Owens Shark during their appointment yesterday. Regarding mindfulness, Marg noted she is unsure if she engaged in any mindfulness exercises. Thus, mindfulness was reviewed. Additionally, psychoeducation regarding formal (e.g., setting aside a specific time daily to engage in an exercise) and informal (e.g., cultivating awareness in the present moment and taking a non-judgmental approach while engaging in day-to-day tasks) mindfulness was provided. This provider also discussed the utilization of YouTube for mindfulness exercises (e.g., videos by Merri Ray). She requested the handout for mindfulness be re-sent via e-mail. Overall, Sakoya was receptive to today's session as evidenced by openness to sharing, responsiveness to feedback, and willingness to engage in mindfulness exercises.  Mental Status Examination:  Appearance: neat Behavior: cooperative Mood: euthymic Affect: mood congruent Speech: normal in rate, volume, and tone Eye Contact: appropriate Psychomotor Activity: appropriate Thought Process: linear, logical, and goal directed  Content/Perceptual Disturbances: denies suicidal and homicidal ideation, plan, and intent and no hallucinations, delusions, bizarre thinking or behavior reported or observed Orientation: time, person,  place and purpose of appointment Cognition/Sensorium: memory, attention, language, and fund of knowledge intact  Insight: fair Judgment: fair  Structured Assessment Results: The Patient Health Questionnaire-9 (PHQ-9) is a self-report measure  that assesses symptoms and severity of depression over the course of the last two weeks. Debraann obtained a score of 5 suggesting mild depression. Sereen finds the endorsed symptoms to be somewhat difficult. Little interest or pleasure in doing things 0  Feeling down, depressed, or hopeless 0  Trouble falling or staying asleep, or sleeping too much 0  Feeling tired or having little energy 2  Poor appetite or overeating 2  Feeling bad about yourself --- or that you are a failure or have let yourself or your family down 0  Trouble concentrating on things, such as reading the newspaper or watching television 1  Moving or speaking so slowly that other people could have noticed? Or the opposite --- being so fidgety or restless that you have been moving around a lot more than usual 0  Thoughts that you would be better off dead or hurting yourself in some way 0  PHQ-9 Score 5    The Generalized Anxiety Disorder-7 (GAD-7) is a brief self-report measure that assesses symptoms of anxiety over the course of the last two weeks. Nieasha obtained a score of 7 suggesting mild anxiety. Lamiyah finds the endorsed symptoms to be somewhat difficult. Feeling nervous, anxious, on edge 2  Not being able to stop or control worrying 1  Worrying too much about different things 1  Trouble relaxing 1  Being so restless that it's hard to sit still 0  Becoming easily annoyed or irritable 1  Feeling afraid as if something awful might happen 1  GAD-7 Score 7   Interventions:  Conducted a brief chart review Verbal administration of PHQ-9 and GAD-7 for symptom monitoring Provided empathic reflections and validation Reviewed content from the previous session Psychoeducation  provided regarding mindfulness Employed supportive psychotherapy interventions to facilitate reduced distress, and to improve coping skills with identified stressors  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 Milanie has demonstrated some progress in her goal as evidenced by increased awareness of hunger patterns and triggers for emotional eating. Emma also reported demonstrates willingness to engage in mindfulness exercises.  Plan: Jermeria continues to appear able and willing to participate as evidenced by engagement in reciprocal conversation, and asking questions for clarification as appropriate. A follow-up appointment was not scheduled at this time, as Keysa is unsure if she will return from [place] in 6 weeks or at the end of the year. She noted a plan to call this provider's office in early November to provide an update, and was also receptive to this provider or provider's office reaching out to check-in early November if she does not call. She also acknowledged understanding that she may request a follow-up appointment with this provider upon her return in the future as long as she is still established with the clinic. Moreover, Sharyn Lull and this provider discussed the option of establishing short-term care with a therapist in [place] during her stay by contacting her insurance company and obtaining a list of in-network providers or by exploring [email or web address]. She noted, "I'm in a happy place."   Addendum: A referral was placed for longer-term therapeutic services at [facility] on October 25, 2018 by this provider. Per Epic, Allysa was contacted by [facility] "three times,with no return call to schedule." This provider requested April Moore,  [place] with [facility] call to follow-up with Sharyn Lull  on three separate occassions. Ms. Laurance Flatten left [place] voicemails on November 21, 2018 and December 12, 2018. Ms. Laurance Flatten called [facility] again on January 03, 2019 and provided Cobie the number for [facility] to call and schedule an appointment. No further follow-up planned by this provider or provider's clinic.

## 2019-01-01 ENCOUNTER — Ambulatory Visit (INDEPENDENT_AMBULATORY_CARE_PROVIDER_SITE_OTHER): Payer: Managed Care, Other (non HMO) | Admitting: Bariatrics

## 2019-01-02 ENCOUNTER — Telehealth (INDEPENDENT_AMBULATORY_CARE_PROVIDER_SITE_OTHER): Payer: Managed Care, Other (non HMO) | Admitting: Bariatrics

## 2019-01-02 ENCOUNTER — Encounter (INDEPENDENT_AMBULATORY_CARE_PROVIDER_SITE_OTHER): Payer: Self-pay | Admitting: Bariatrics

## 2019-01-02 ENCOUNTER — Other Ambulatory Visit (INDEPENDENT_AMBULATORY_CARE_PROVIDER_SITE_OTHER): Payer: Self-pay | Admitting: Bariatrics

## 2019-01-02 ENCOUNTER — Other Ambulatory Visit: Payer: Self-pay

## 2019-01-02 DIAGNOSIS — E669 Obesity, unspecified: Secondary | ICD-10-CM | POA: Diagnosis not present

## 2019-01-02 DIAGNOSIS — F3289 Other specified depressive episodes: Secondary | ICD-10-CM

## 2019-01-02 DIAGNOSIS — R7303 Prediabetes: Secondary | ICD-10-CM

## 2019-01-02 DIAGNOSIS — Z6833 Body mass index (BMI) 33.0-33.9, adult: Secondary | ICD-10-CM | POA: Diagnosis not present

## 2019-01-02 MED ORDER — TOPIRAMATE 50 MG PO TABS: 50.0000 mg | ORAL_TABLET | Freq: Every day | ORAL | 0 refills | Status: DC

## 2019-01-03 ENCOUNTER — Other Ambulatory Visit: Payer: Self-pay

## 2019-01-03 ENCOUNTER — Ambulatory Visit (INDEPENDENT_AMBULATORY_CARE_PROVIDER_SITE_OTHER): Payer: Managed Care, Other (non HMO) | Admitting: Psychology

## 2019-01-03 DIAGNOSIS — F330 Major depressive disorder, recurrent, mild: Secondary | ICD-10-CM

## 2019-01-04 NOTE — Progress Notes (Signed)
Office: [phone]  /  Fax: [phone] TeleHealth Visit:  Erik Obey Such has verbally consented to this TeleHealth visit today. The patient is located at home, the provider is located at the [employer] [facility] office. The participants in this visit include the listed provider and patient. The visit was conducted today via webex.  HPI:   Chief Complaint: OBESITY Cheryl Patterson is here to discuss her progress with her obesity treatment plan. She is on the keep a food journal with 1400 calories and 75 grams of protein daily and is following her eating plan approximately 50 % of the time. She states she is exercising 0 minutes 0 times per week. Cheryl Patterson states that her weight remains the same. She finished up fighting a cold and sinuses (no antibiotics). She had struggled due to being sick.  We were unable to weigh the patient today for this TeleHealth visit. She feels as if she has maintained her weight since her last visit. She has lost 0 lbs since starting treatment with [place].  Pre-Diabetes Cheryl Patterson has a diagnosis of pre-diabetes based on her elevated Hgb A1c and was informed this puts her at greater risk of developing diabetes. She is taking metformin currently and continues to work on diet and exercise to decrease risk of diabetes. She denies polyphagia or hypoglycemia.  Depression with Emotional Eating Behaviors Cheryl Patterson is taking Wellbutrin. Cheryl Patterson struggles with emotional eating and using food for comfort to the extent that it is negatively impacting her health. She often snacks when she is not hungry. Cheryl Patterson sometimes feels she is out of control and then feels guilty that she made poor food choices. She has been working on behavior modification techniques to help reduce her emotional eating and has been somewhat successful. She shows no sign of suicidal or homicidal ideations.  Depression screen [facility] 2/9 09/12/2018 11/23/2016 04/02/2016  Decreased Interest 2 3 2   Down, Depressed,  Hopeless 2 1 2   PHQ - 2 Score 4 4 4   Altered sleeping 3 2 3   Tired, decreased energy 3 3 3   Change in appetite 3 3 3   Feeling bad or failure about yourself  3 2 0  Trouble concentrating 3 2 3   Moving slowly or fidgety/restless 3 0 0  Suicidal thoughts 0 0 0  PHQ-9 Score 22 16 16   Difficult doing work/chores Somewhat difficult - Somewhat difficult    ASSESSMENT AND PLAN:  Prediabetes  Other depression - with emotional eating   Class 1 obesity with serious comorbidity and body mass index (BMI) of 33.0 to 33.9 in adult, unspecified obesity type  PLAN:  Pre-Diabetes Cheryl Patterson will continue to work on weight loss, exercise, increase protein, and decreasing simple carbohydrates in her diet to help decrease the risk of diabetes. She is to continue to increase activity. We dicussed metformin including benefits and risks. She was informed that eating too many simple carbohydrates or too many calories at one sitting increases the likelihood of GI side effects. Saniyya agrees to continue taking metformin, and she agrees to follow up with our clinic in 2 weeks as directed to monitor her progress.  Depression with Emotional Eating Behaviors We discussed cognitive behavioral modification techniques today to help Cheryl Patterson deal with her emotional eating and depression. Cheryl Patterson agrees to start Topamax 50 mg 1 tablet PO with dinner #30 with no refills. Kishawna agrees to follow up with our clinic in 2 weeks.  Obesity Cheryl Patterson is currently in the action stage of change. As such, her goal is to  continue with weight loss efforts She has agreed to keep a food journal with 1400 calories and 75 grams of protein daily Cheryl Patterson is to get back on the plan. Kainat has been instructed to work up to a goal of 150 minutes of combined cardio and strengthening exercise per week for weight loss and overall health benefits. We discussed the following Behavioral Modification Strategies today: increasing lean  protein intake, decreasing simple carbohydrates, increasing vegetables, decrease eating out, work on meal planning and easy cooking plans, increase H20 intake, no skipping meals, keeping healthy foods in the home, and planning for success   Cheryl Patterson has agreed to follow up with our clinic in 2 weeks. She was informed of the importance of frequent follow up visits to maximize her success with intensive lifestyle modifications for her multiple health conditions.  ALLERGIES: Allergies  Allergen Reactions  . Codeine     Itchy throat, whelps on skin  . Other Swelling and Rash    tomatoes    MEDICATIONS: Current Outpatient Medications on File Prior to Visit  Medication Sig Dispense Refill  . Albuterol Sulfate (PROAIR RESPICLICK) 123XX123 (90 Base) MCG/ACT AEPB Inhale 2 puffs into the lungs as needed.    Marland Kitchen amphetamine-dextroamphetamine (ADDERALL) 20 MG tablet Take 1 tablet (20 mg total) by mouth 2 (two) times daily. 60 tablet 0  . benzonatate (TESSALON) 100 MG capsule Take 1 capsule (100 mg total) by mouth 3 (three) times daily as needed for cough. 30 capsule 0  . buPROPion (WELLBUTRIN SR) 150 MG 12 hr tablet Take 1 tablet (150 mg total) by mouth daily. 30 tablet 0  . fluticasone (FLONASE) 50 MCG/ACT nasal spray Place 2 sprays into both nostrils daily. 16 g 1  . loratadine (CLARITIN) 10 MG tablet Take 1 tablet (10 mg total) by mouth daily. 30 tablet 11  . metFORMIN (GLUCOPHAGE) 500 MG tablet Take 1 tablet (500 mg total) by mouth daily after lunch. 30 tablet 0  . Vitamin D, Ergocalciferol, (DRISDOL) 1.25 MG (50000 UT) CAPS capsule Take 1 capsule (50,000 Units total) by mouth every 7 (seven) days. 4 capsule 0   Current Facility-Administered Medications on File Prior to Visit  Medication Dose Route Frequency Provider Last Rate Last Dose  . 0.9 %  sodium chloride infusion  500 mL Intravenous Continuous Milus Banister, MD        PAST MEDICAL HISTORY: Past Medical History:  Diagnosis Date  . ADD  (attention deficit disorder)   . Allergy   . Anxiety   . Asthma    per pt- bronchitis brings on the symptoms  . Bipolar depression ([place])   . Chronic headaches   . Chronic kidney disease    kidney stones  . Constipation   . Depression    [facility]---suicidal ideation  . Dyspnea   . GERD (gastroesophageal reflux disease)   . Hyperlipidemia    no meds taken as of 04-20-16  . IBS (irritable bowel syndrome)   . Joint pain   . Kidney infection   . Lactose intolerance   . Prediabetes   . Ruptured disc, thoracic   . Sepsis ([place])    2014-  d/t urinary infection  . UTI (urinary tract infection)   . Vitamin D deficiency     PAST SURGICAL HISTORY: Past Surgical History:  Procedure Laterality Date  . BREAST SURGERY     left breast lump removed  . COLONOSCOPY    . CYSTOSCOPY N/A 03/18/2015   Procedure: CYSTOSCOPY;  Surgeon: Terrance Mass, MD;  Location: [facility];  Service: Gynecology;  Laterality: N/A;  . KIDNEY STONE SURGERY    . LAPAROSCOPIC BILATERAL SALPINGECTOMY Bilateral 03/18/2015   Procedure: LAPAROSCOPIC BILATERAL SALPINGECTOMY;  Surgeon: Terrance Mass, MD;  Location: [facility];  Service: Gynecology;  Laterality: Bilateral;  . LAPAROSCOPIC VAGINAL HYSTERECTOMY WITH SALPINGO OOPHORECTOMY N/A 03/18/2015   Procedure: LAPAROSCOPIC ASSISTED VAGINAL HYSTERECTOMY WITH SALPINGO OOPHORECTOMY;  Surgeon: Terrance Mass, MD;  Location: [facility];  Service: Gynecology;  Laterality: N/A;  . LEEP  2005  . LIPOMA EXCISION    . MOUTH SURGERY    . OTHER SURGICAL HISTORY     RENAL STONE REMOVAL  . TUBAL LIGATION      SOCIAL HISTORY: Social History   Tobacco Use  . Smoking status: Never Smoker  . Smokeless tobacco: Never Used  Substance Use Topics  . Alcohol use: No    Alcohol/week: 0.0 standard drinks  . Drug use: No    FAMILY HISTORY: Family History  Problem Relation Age of Onset  . Colon cancer Maternal Aunt   . Cancer Maternal Aunt        colon  . Breast  cancer Mother   . Colon cancer Mother 76  . Cancer Mother 61       colon  . Anxiety disorder Mother   . Hypertension Father   . Hyperlipidemia Father   . Hypertension Other   . Stroke Paternal Grandmother   . Hypertension Paternal Grandmother   . Heart disease Paternal Grandmother   . Breast cancer Paternal Aunt   . Cancer Paternal Aunt        breast  . Uterine cancer Maternal Aunt   . Brain cancer Maternal Aunt   . Diabetes Maternal Grandfather   . Cancer Maternal Uncle        COLON  . Colon cancer Paternal Uncle   . Esophageal cancer Neg Hx   . Rectal cancer Neg Hx   . Stomach cancer Neg Hx     ROS: Review of Systems  Constitutional: Negative for weight loss.  Endo/Heme/Allergies:       Negative polyphagia Negative hypoglycemia  Psychiatric/Behavioral: Positive for depression. Negative for suicidal ideas.    PHYSICAL EXAM: Pt in no acute distress  RECENT LABS AND TESTS: BMET    Component Value Date/Time   NA 137 09/12/2018 0000   K 4.7 09/12/2018 0000   CL 97 09/12/2018 0000   CO2 25 09/12/2018 0000   GLUCOSE 91 09/12/2018 0000   GLUCOSE 97 04/27/2018 1054   BUN 12 09/12/2018 0000   CREATININE 0.86 09/12/2018 0000   CREATININE 0.84 07/20/2016 0941   CALCIUM 9.6 09/12/2018 0000   GFRNONAA 80 09/12/2018 0000   GFRAA 92 09/12/2018 0000   Lab Results  Component Value Date   HGBA1C 5.8 (H) 09/12/2018   HGBA1C 6.0 04/27/2018   HGBA1C 5.8 (H) 11/23/2016   Lab Results  Component Value Date   INSULIN 16.7 09/12/2018   INSULIN 9.3 11/23/2016   CBC    Component Value Date/Time   WBC 6.3 09/12/2018 0000   WBC 6.1 04/27/2018 1054   RBC 4.80 09/12/2018 0000   RBC 4.62 04/27/2018 1054   HGB 13.6 09/12/2018 0000   HCT 41.1 09/12/2018 0000   PLT 282.0 04/27/2018 1054   MCV 86 09/12/2018 0000   MCH 28.3 09/12/2018 0000   MCH 28.3 07/20/2016 0941   MCHC 33.1 09/12/2018 0000   MCHC 33.4 04/27/2018 1054   RDW  12.6 09/12/2018 0000   LYMPHSABS 2.1  09/12/2018 0000   MONOABS 0.4 04/27/2018 1054   EOSABS 0.1 09/12/2018 0000   BASOSABS 0.0 09/12/2018 0000   Iron/TIBC/Ferritin/ %Sat    Component Value Date/Time   IRON 104 05/31/2008 0911   FERRITIN 33.9 05/31/2008 0911   IRONPCTSAT 27.3 05/31/2008 0911   Lipid Panel     Component Value Date/Time   CHOL 252 (H) 09/12/2018 0000   TRIG 59 09/12/2018 0000   HDL 50 09/12/2018 0000   CHOLHDL 5 04/27/2018 1054   VLDL 12.8 04/27/2018 1054   LDLCALC 190 (H) 09/12/2018 0000   LDLDIRECT 144.3 06/25/2010 0958   Hepatic Function Panel     Component Value Date/Time   PROT 7.2 09/12/2018 0000   ALBUMIN 4.4 09/12/2018 0000   AST 18 09/12/2018 0000   ALT 18 09/12/2018 0000   ALKPHOS 103 09/12/2018 0000   BILITOT 0.2 09/12/2018 0000   BILIDIR 0.1 08/23/2014 1020      Component Value Date/Time   TSH 1.790 09/12/2018 0000   TSH 1.60 04/27/2018 1054   TSH 1.16 10/24/2017 1108      I, Trixie Dredge, am acting as Location manager for [employer], DO  I have reviewed the above documentation for accuracy and completeness, and I agree with the above. Jearld Lesch, DO

## 2019-01-11 ENCOUNTER — Other Ambulatory Visit: Payer: Self-pay | Admitting: Family Medicine

## 2019-01-11 ENCOUNTER — Encounter: Payer: Self-pay | Admitting: Family Medicine

## 2019-01-11 DIAGNOSIS — F988 Other specified behavioral and emotional disorders with onset usually occurring in childhood and adolescence: Secondary | ICD-10-CM

## 2019-01-12 MED ORDER — AMPHETAMINE-DEXTROAMPHETAMINE 20 MG PO TABS: 20.0000 mg | ORAL_TABLET | Freq: Two times a day (BID) | ORAL | 0 refills | Status: DC

## 2019-01-12 NOTE — Telephone Encounter (Signed)
Last Adderall RX: 11/01/18, #60 x no refills Last OV:  03/27/18 w / PCP, 2 acute visit with Percell Miller in 2020 and multiple with Medical wt loss mgmt Next OV:  04/30/19 UDS:  10/24/17 past due CSC: 07/22/17  Past due

## 2019-01-16 ENCOUNTER — Encounter (INDEPENDENT_AMBULATORY_CARE_PROVIDER_SITE_OTHER): Payer: Self-pay | Admitting: Bariatrics

## 2019-01-16 ENCOUNTER — Telehealth (INDEPENDENT_AMBULATORY_CARE_PROVIDER_SITE_OTHER): Payer: Managed Care, Other (non HMO) | Admitting: Bariatrics

## 2019-01-16 ENCOUNTER — Other Ambulatory Visit: Payer: Self-pay

## 2019-01-16 DIAGNOSIS — Z6835 Body mass index (BMI) 35.0-35.9, adult: Secondary | ICD-10-CM

## 2019-01-16 DIAGNOSIS — E782 Mixed hyperlipidemia: Secondary | ICD-10-CM | POA: Diagnosis not present

## 2019-01-16 DIAGNOSIS — R7303 Prediabetes: Secondary | ICD-10-CM | POA: Diagnosis not present

## 2019-01-16 MED ORDER — TROKENDI XR 50 MG PO CP24: ORAL_CAPSULE | ORAL | 0 refills | Status: DC

## 2019-01-16 NOTE — Progress Notes (Signed)
Office: [phone]  /  Fax: [phone] TeleHealth Visit:  Cheryl Patterson has verbally consented to this TeleHealth visit today. The patient is located at home, the provider is located at the [employer] [facility] office. The participants in this visit include the listed provider and patient. The visit was conducted today via Webex.  HPI:   Chief Complaint: OBESITY Cheryl Patterson is here to discuss her progress with her obesity treatment plan. She is keeping a food journal with 1400 calories and 75 grams of protein and is following her eating plan approximately 30% of the time. She states she is doing calisthenics 20-30 minutes 2-3 times per week. Cheryl Patterson states that her weight remains the same (weight 213). She had problems sleeping with the Topamax and stopped the medication. We were unable to weigh the patient today for this TeleHealth visit. She feels as if she has maintained her weight since her last visit. She has lost 0 lbs since starting treatment with [place].  Pre-Diabetes Cheryl Patterson has a diagnosis of prediabetes based on her elevated Hgb A1c and was informed this puts her at greater risk of developing diabetes. Last A1c 5.8 with an insulin of 16.7. She is taking metformin currently and continues to work on diet and exercise to decrease risk of diabetes. She denies nausea or hypoglycemia.  Hyperlipidemia, Mixed Cheryl Patterson has hyperlipidemia and is on no medications. She has been trying to improve her cholesterol levels with intensive lifestyle modification including a low saturated fat diet, exercise and weight loss. She denies any chest pain, claudication or myalgias.  Depression with emotional eating behaviors Cheryl Patterson is struggling with emotional eating and using food for comfort to the extent that it is negatively impacting her health. She often snacks when she is not hungry. Cheryl Patterson sometimes feels she is out of control and then feels guilty that she made poor food choices. She has  been working on behavior modification techniques to help reduce her emotional eating and has been somewhat successful. Cheryl Patterson is "eating sweets" and reports stress eating. She shows no sign of suicidal or homicidal ideations.  Depression screen Cheryl Patterson 2/9 09/12/2018 11/23/2016 04/02/2016  Decreased Interest 2 3 2   Down, Depressed, Hopeless 2 1 2   PHQ - 2 Score 4 4 4   Altered sleeping 3 2 3   Tired, decreased energy 3 3 3   Change in appetite 3 3 3   Feeling bad or failure about yourself  3 2 0  Trouble concentrating 3 2 3   Moving slowly or fidgety/restless 3 0 0  Suicidal thoughts 0 0 0  PHQ-9 Score 22 16 16   Difficult doing work/chores Somewhat difficult - Somewhat difficult   ASSESSMENT AND PLAN:  Prediabetes  Mixed hyperlipidemia  Class 2 severe obesity with serious comorbidity and body mass index (BMI) of 35.0 to 35.9 in adult, unspecified obesity type [facility])  PLAN:  Pre-Diabetes Cheryl Patterson will continue to work on weight loss, exercise, and decreasing simple carbohydrates in her diet to help decrease the risk of diabetes. We dicussed metformin including benefits and risks. She was informed that eating too many simple carbohydrates or too many calories at one sitting increases the likelihood of GI side effects. Cheryl Patterson will continue medication and follow-up with [place] as directed to monitor her progress.  Hyperlipidemia, Mixed Cheryl Patterson was informed of the American Heart Association Guidelines emphasizing intensive lifestyle modifications as the first line treatment for hyperlipidemia. We discussed many lifestyle modifications today in depth, and Cheryl Patterson will continue to work on decreasing saturated fats such as  fatty red meat, butter and many fried foods. Cheryl Patterson was instructed to decrease saturated fats and increase MUFA's and PUFA's. She will also increase vegetables and lean protein in her diet and continue to work on exercise and weight loss efforts.  Depression with Emotional Eating  Behaviors We discussed behavior modification techniques today to help Cheryl Patterson deal with her emotional eating and depression. Cheryl Patterson will stop the Topamax and will begin Trokendi 50 mg 1 PO QD #30 with 0 refills. She agrees to follow-up with our clinic in 2 weeks.  Obesity Cheryl Patterson is currently in the action stage of change. As such, her goal is to continue with weight loss efforts. She has agreed to keep a food journal with 1300 calories and 75 grams of protein.  Cheryl Patterson will work on meal planning and intentional eating. She will go down from 1400 calories to 1300 calories a day with 75 grams of protein. Cheryl Patterson has been instructed to continue exercise using the [employer] for weight loss and overall health benefits. We discussed the following Behavioral Modification Strategies today: increasing lean protein intake, decreasing simple carbohydrates, increasing vegetables, increase H20 intake, decrease eating out, no skipping meals, work on meal planning and easy cooking plans, keeping healthy foods in the home, and planning for success.  Cheryl Patterson has agreed to follow-up with our clinic in 2 weeks. She was informed of the importance of frequent follow-up visits to maximize her success with intensive lifestyle modifications for her multiple health conditions.  ALLERGIES: Allergies  Allergen Reactions  . Codeine     Itchy throat, whelps on skin  . Other Swelling and Rash    tomatoes    MEDICATIONS: Current Outpatient Medications on File Prior to Visit  Medication Sig Dispense Refill  . Albuterol Sulfate (PROAIR RESPICLICK) 123XX123 (90 Base) MCG/ACT AEPB Inhale 2 puffs into the lungs as needed.    Marland Kitchen amphetamine-dextroamphetamine (ADDERALL) 20 MG tablet Take 1 tablet (20 mg total) by mouth 2 (two) times daily. 60 tablet 0  . benzonatate (TESSALON) 100 MG capsule Take 1 capsule (100 mg total) by mouth 3 (three) times daily as needed for cough. 30 capsule 0  . buPROPion (WELLBUTRIN SR) 150 MG 12  hr tablet Take 1 tablet (150 mg total) by mouth daily. 30 tablet 0  . fluticasone (FLONASE) 50 MCG/ACT nasal spray Place 2 sprays into both nostrils daily. 16 g 1  . loratadine (CLARITIN) 10 MG tablet Take 1 tablet (10 mg total) by mouth daily. 30 tablet 11  . metFORMIN (GLUCOPHAGE) 500 MG tablet Take 1 tablet (500 mg total) by mouth daily after lunch. 30 tablet 0  . Vitamin D, Ergocalciferol, (DRISDOL) 1.25 MG (50000 UT) CAPS capsule Take 1 capsule (50,000 Units total) by mouth every 7 (seven) days. 4 capsule 0   Current Facility-Administered Medications on File Prior to Visit  Medication Dose Route Frequency Provider Last Rate Last Dose  . 0.9 %  sodium chloride infusion  500 mL Intravenous Continuous Milus Banister, MD        PAST MEDICAL HISTORY: Past Medical History:  Diagnosis Date  . ADD (attention deficit disorder)   . Allergy   . Anxiety   . Asthma    per pt- bronchitis brings on the symptoms  . Bipolar depression ([place])   . Chronic headaches   . Chronic kidney disease    kidney stones  . Constipation   . Depression    [facility]---suicidal ideation  . Dyspnea   . GERD (  gastroesophageal reflux disease)   . Hyperlipidemia    no meds taken as of 04-20-16  . IBS (irritable bowel syndrome)   . Joint pain   . Kidney infection   . Lactose intolerance   . Prediabetes   . Ruptured disc, thoracic   . Sepsis ([place])    2014-  d/t urinary infection  . UTI (urinary tract infection)   . Vitamin D deficiency     PAST SURGICAL HISTORY: Past Surgical History:  Procedure Laterality Date  . BREAST SURGERY     left breast lump removed  . COLONOSCOPY    . CYSTOSCOPY N/A 03/18/2015   Procedure: CYSTOSCOPY;  Surgeon: Terrance Mass, MD;  Location: [facility];  Service: Gynecology;  Laterality: N/A;  . KIDNEY STONE SURGERY    . LAPAROSCOPIC BILATERAL SALPINGECTOMY Bilateral 03/18/2015   Procedure: LAPAROSCOPIC BILATERAL SALPINGECTOMY;  Surgeon: Terrance Mass, MD;   Location: [facility];  Service: Gynecology;  Laterality: Bilateral;  . LAPAROSCOPIC VAGINAL HYSTERECTOMY WITH SALPINGO OOPHORECTOMY N/A 03/18/2015   Procedure: LAPAROSCOPIC ASSISTED VAGINAL HYSTERECTOMY WITH SALPINGO OOPHORECTOMY;  Surgeon: Terrance Mass, MD;  Location: [facility];  Service: Gynecology;  Laterality: N/A;  . LEEP  2005  . LIPOMA EXCISION    . MOUTH SURGERY    . OTHER SURGICAL HISTORY     RENAL STONE REMOVAL  . TUBAL LIGATION      SOCIAL HISTORY: Social History   Tobacco Use  . Smoking status: Never Smoker  . Smokeless tobacco: Never Used  Substance Use Topics  . Alcohol use: No    Alcohol/week: 0.0 standard drinks  . Drug use: No    FAMILY HISTORY: Family History  Problem Relation Age of Onset  . Colon cancer Maternal Aunt   . Cancer Maternal Aunt        colon  . Breast cancer Mother   . Colon cancer Mother 34  . Cancer Mother 35       colon  . Anxiety disorder Mother   . Hypertension Father   . Hyperlipidemia Father   . Hypertension Other   . Stroke Paternal Grandmother   . Hypertension Paternal Grandmother   . Heart disease Paternal Grandmother   . Breast cancer Paternal Aunt   . Cancer Paternal Aunt        breast  . Uterine cancer Maternal Aunt   . Brain cancer Maternal Aunt   . Diabetes Maternal Grandfather   . Cancer Maternal Uncle        COLON  . Colon cancer Paternal Uncle   . Esophageal cancer Neg Hx   . Rectal cancer Neg Hx   . Stomach cancer Neg Hx    ROS: Review of Systems  Cardiovascular: Negative for chest pain and claudication.  Gastrointestinal: Negative for nausea.  Musculoskeletal: Negative for myalgias.  Endo/Heme/Allergies:       Negative for hypoglycemia.  Psychiatric/Behavioral: Positive for depression (emotional eating). Negative for suicidal ideas.       Negative for homicidal ideas.   PHYSICAL EXAM: Pt in no acute distress  RECENT LABS AND TESTS: BMET    Component Value Date/Time   NA 137 09/12/2018 0000   K 4.7  09/12/2018 0000   CL 97 09/12/2018 0000   CO2 25 09/12/2018 0000   GLUCOSE 91 09/12/2018 0000   GLUCOSE 97 04/27/2018 1054   BUN 12 09/12/2018 0000   CREATININE 0.86 09/12/2018 0000   CREATININE 0.84 07/20/2016 0941   CALCIUM 9.6 09/12/2018 0000   GFRNONAA 80  09/12/2018 0000   GFRAA 92 09/12/2018 0000   Lab Results  Component Value Date   HGBA1C 5.8 (H) 09/12/2018   HGBA1C 6.0 04/27/2018   HGBA1C 5.8 (H) 11/23/2016   Lab Results  Component Value Date   INSULIN 16.7 09/12/2018   INSULIN 9.3 11/23/2016   CBC    Component Value Date/Time   WBC 6.3 09/12/2018 0000   WBC 6.1 04/27/2018 1054   RBC 4.80 09/12/2018 0000   RBC 4.62 04/27/2018 1054   HGB 13.6 09/12/2018 0000   HCT 41.1 09/12/2018 0000   PLT 282.0 04/27/2018 1054   MCV 86 09/12/2018 0000   MCH 28.3 09/12/2018 0000   MCH 28.3 07/20/2016 0941   MCHC 33.1 09/12/2018 0000   MCHC 33.4 04/27/2018 1054   RDW 12.6 09/12/2018 0000   LYMPHSABS 2.1 09/12/2018 0000   MONOABS 0.4 04/27/2018 1054   EOSABS 0.1 09/12/2018 0000   BASOSABS 0.0 09/12/2018 0000   Iron/TIBC/Ferritin/ %Sat    Component Value Date/Time   IRON 104 05/31/2008 0911   FERRITIN 33.9 05/31/2008 0911   IRONPCTSAT 27.3 05/31/2008 0911   Lipid Panel     Component Value Date/Time   CHOL 252 (H) 09/12/2018 0000   TRIG 59 09/12/2018 0000   HDL 50 09/12/2018 0000   CHOLHDL 5 04/27/2018 1054   VLDL 12.8 04/27/2018 1054   LDLCALC 190 (H) 09/12/2018 0000   LDLDIRECT 144.3 06/25/2010 0958   Hepatic Function Panel     Component Value Date/Time   PROT 7.2 09/12/2018 0000   ALBUMIN 4.4 09/12/2018 0000   AST 18 09/12/2018 0000   ALT 18 09/12/2018 0000   ALKPHOS 103 09/12/2018 0000   BILITOT 0.2 09/12/2018 0000   BILIDIR 0.1 08/23/2014 1020      Component Value Date/Time   TSH 1.790 09/12/2018 0000   TSH 1.60 04/27/2018 1054   TSH 1.16 10/24/2017 1108   Results for MIRIYA, RUGGIERO (MRN [id number]) as of 01/16/2019 10:10  Ref. Range 09/12/2018  00:00  Vitamin D, 25-Hydroxy Latest Ref Range: 30.0 - 100.0 ng/mL 26.9 (L)   I, Michaelene Song, am acting as Location manager for [employer], DO  I have reviewed the above documentation for accuracy and completeness, and I agree with the above. -Jearld Lesch, DO

## 2019-01-24 ENCOUNTER — Other Ambulatory Visit (INDEPENDENT_AMBULATORY_CARE_PROVIDER_SITE_OTHER): Payer: Self-pay | Admitting: Bariatrics

## 2019-01-30 ENCOUNTER — Ambulatory Visit (INDEPENDENT_AMBULATORY_CARE_PROVIDER_SITE_OTHER): Payer: Managed Care, Other (non HMO) | Admitting: Bariatrics

## 2019-01-31 ENCOUNTER — Encounter (INDEPENDENT_AMBULATORY_CARE_PROVIDER_SITE_OTHER): Payer: Self-pay

## 2019-02-26 ENCOUNTER — Telehealth (INDEPENDENT_AMBULATORY_CARE_PROVIDER_SITE_OTHER): Payer: Self-pay | Admitting: Psychology

## 2019-02-26 NOTE — Telephone Encounter (Signed)
  Office: [phone]  /  Fax: [phone]  Date of Call: February 26, 2019  Time of Call: 3:51pm Provider: Glennie Isle, PsyD  CONTENT: As discussed during the last appointment, this provider called Sharyn Lull to check-in. A HIPAA compliant voicemail was left requesting a call back.   PLAN: No further follow-up planned by this provider.

## 2019-04-07 ENCOUNTER — Encounter: Payer: Self-pay | Admitting: Family Medicine

## 2019-04-10 ENCOUNTER — Other Ambulatory Visit: Payer: Self-pay | Admitting: Family Medicine

## 2019-04-10 MED ORDER — BENZONATATE 100 MG PO CAPS: 100.0000 mg | ORAL_CAPSULE | Freq: Three times a day (TID) | ORAL | 0 refills | Status: DC | PRN

## 2019-04-10 NOTE — Telephone Encounter (Signed)
I sent in a rx  But I would give her the info for getting an app for covid testing

## 2019-04-20 HISTORY — PX: BREAST BIOPSY: SHX20

## 2019-04-27 ENCOUNTER — Other Ambulatory Visit: Payer: Self-pay

## 2019-04-30 ENCOUNTER — Encounter: Payer: Self-pay | Admitting: Family Medicine

## 2019-04-30 ENCOUNTER — Other Ambulatory Visit: Payer: Self-pay

## 2019-04-30 ENCOUNTER — Ambulatory Visit (INDEPENDENT_AMBULATORY_CARE_PROVIDER_SITE_OTHER): Payer: Managed Care, Other (non HMO) | Admitting: Family Medicine

## 2019-04-30 VITALS — BP 100/80 | HR 87 | Temp 97.8°F | Ht 67.0 in | Wt 211.6 lb

## 2019-04-30 DIAGNOSIS — F3289 Other specified depressive episodes: Secondary | ICD-10-CM | POA: Diagnosis not present

## 2019-04-30 DIAGNOSIS — Z23 Encounter for immunization: Secondary | ICD-10-CM | POA: Diagnosis not present

## 2019-04-30 DIAGNOSIS — Z0000 Encounter for general adult medical examination without abnormal findings: Secondary | ICD-10-CM | POA: Diagnosis not present

## 2019-04-30 DIAGNOSIS — F988 Other specified behavioral and emotional disorders with onset usually occurring in childhood and adolescence: Secondary | ICD-10-CM

## 2019-04-30 LAB — CBC WITH DIFFERENTIAL/PLATELET
Basophils Absolute: 0 10*3/uL (ref 0.0–0.1)
Basophils Relative: 0.3 % (ref 0.0–3.0)
Eosinophils Absolute: 0.1 10*3/uL (ref 0.0–0.7)
Eosinophils Relative: 1.1 % (ref 0.0–5.0)
HCT: 40.6 % (ref 36.0–46.0)
Hemoglobin: 13.3 g/dL (ref 12.0–15.0)
Lymphocytes Relative: 29.1 % (ref 12.0–46.0)
Lymphs Abs: 1.6 10*3/uL (ref 0.7–4.0)
MCHC: 32.7 g/dL (ref 30.0–36.0)
MCV: 88.1 fl (ref 78.0–100.0)
Monocytes Absolute: 0.2 10*3/uL (ref 0.1–1.0)
Monocytes Relative: 4.2 % (ref 3.0–12.0)
Neutro Abs: 3.5 10*3/uL (ref 1.4–7.7)
Neutrophils Relative %: 65.3 % (ref 43.0–77.0)
Platelets: 289 10*3/uL (ref 150.0–400.0)
RBC: 4.6 Mil/uL (ref 3.87–5.11)
RDW: 13.1 % (ref 11.5–15.5)
WBC: 5.4 10*3/uL (ref 4.0–10.5)

## 2019-04-30 LAB — LIPID PANEL
Cholesterol: 223 mg/dL — ABNORMAL HIGH (ref 0–200)
HDL: 43.4 mg/dL (ref >39.00)
LDL Cholesterol: 163 mg/dL — ABNORMAL HIGH (ref 0–99)
NonHDL: 179.66
Total CHOL/HDL Ratio: 5
Triglycerides: 84 mg/dL (ref 0.0–149.0)
VLDL: 16.8 mg/dL (ref 0.0–40.0)

## 2019-04-30 LAB — COMPREHENSIVE METABOLIC PANEL
ALT: 10 U/L (ref 0–35)
AST: 13 U/L (ref 0–37)
Albumin: 4.1 g/dL (ref 3.5–5.2)
Alkaline Phosphatase: 83 U/L (ref 39–117)
BUN: 8 mg/dL (ref 6–23)
CO2: 29 mEq/L (ref 19–32)
Calcium: 9.3 mg/dL (ref 8.4–10.5)
Chloride: 102 mEq/L (ref 96–112)
Creatinine, Ser: 0.83 mg/dL (ref 0.40–1.20)
GFR: 88.4 mL/min (ref >60.00)
Glucose, Bld: 96 mg/dL (ref 70–99)
Potassium: 4.3 mEq/L (ref 3.5–5.1)
Sodium: 138 mEq/L (ref 135–145)
Total Bilirubin: 0.4 mg/dL (ref 0.2–1.2)
Total Protein: 7.2 g/dL (ref 6.0–8.3)

## 2019-04-30 LAB — VITAMIN D 25 HYDROXY (VIT D DEFICIENCY, FRACTURES): VITD: 22.14 ng/mL — ABNORMAL LOW (ref 30.00–100.00)

## 2019-04-30 LAB — HEMOGLOBIN A1C: Hgb A1c MFr Bld: 5.9 % (ref 4.6–6.5)

## 2019-04-30 LAB — TSH: TSH: 1.4 u[IU]/mL (ref 0.35–4.50)

## 2019-04-30 LAB — SARS-COV-2 IGG: SARS-COV-2 IgG: 0.03

## 2019-04-30 MED ORDER — FLUOXETINE HCL 10 MG PO CAPS: 10.0000 mg | ORAL_CAPSULE | Freq: Every day | ORAL | 3 refills | Status: DC

## 2019-04-30 MED ORDER — AMPHETAMINE-DEXTROAMPHETAMINE 20 MG PO TABS: 20.0000 mg | ORAL_TABLET | Freq: Two times a day (BID) | ORAL | 0 refills | Status: DC

## 2019-04-30 NOTE — Patient Instructions (Signed)

## 2019-04-30 NOTE — Progress Notes (Signed)
Subjective:     Cheryl Patterson is a 49 y.o. female and is here for a comprehensive physical exam. The patient reports problems - depression and irritability with menopause .  Social History   Socioeconomic History  . Marital status: Married    Spouse name: Octavia Bruckner  . Number of children: 2  . Years of education: Not on file  . Highest education level: Not on file  Occupational History  . Occupation: Lobbyist: [employer]  Tobacco Use  . Smoking status: Never Smoker  . Smokeless tobacco: Never Used  Substance and Sexual Activity  . Alcohol use: No    Alcohol/week: 0.0 standard drinks  . Drug use: No  . Sexual activity: Yes    Partners: Male    Birth control/protection: None  Other Topics Concern  . Not on file  Social History Narrative   Exercise--walk qd                    Video--  rare   Social Determinants of Health   Financial Resource Strain:   . Difficulty of Paying Living Expenses: Not on file  Food Insecurity:   . Worried About Charity fundraiser in the Last Year: Not on file  . Ran Out of Food in the Last Year: Not on file  Transportation Needs:   . Lack of Transportation (Medical): Not on file  . Lack of Transportation (Non-Medical): Not on file  Physical Activity:   . Days of Exercise per Week: Not on file  . Minutes of Exercise per Session: Not on file  Stress:   . Feeling of Stress : Not on file  Social Connections:   . Frequency of Communication with Friends and Family: Not on file  . Frequency of Social Gatherings with Friends and Family: Not on file  . Attends Religious Services: Not on file  . Active Member of Clubs or Organizations: Not on file  . Attends Archivist Meetings: Not on file  . Marital Status: Not on file  Intimate Partner Violence:   . Fear of Current or Ex-Partner: Not on file  . Emotionally Abused: Not on file  . Physically Abused: Not on file  . Sexually Abused: Not on file   Health  Maintenance  Topic Date Due  . MAMMOGRAM  06/11/2016  . COLONOSCOPY  04/27/2021  . TETANUS/TDAP  04/27/2028  . INFLUENZA VACCINE  Completed  . HIV Screening  Completed    The following portions of the patient's history were reviewed and updated as appropriate: She  has a past medical history of ADD (attention deficit disorder), Allergy, Anxiety, Asthma, Bipolar depression ([place]), Chronic headaches, Chronic kidney disease, Constipation, Depression, Dyspnea, GERD (gastroesophageal reflux disease), Hyperlipidemia, IBS (irritable bowel syndrome), Joint pain, Kidney infection, Lactose intolerance, Prediabetes, Ruptured disc, thoracic, Sepsis ([place]), UTI (urinary tract infection), and Vitamin D deficiency. She does not have any pertinent problems on file. She  has a past surgical history that includes Tubal ligation; LEEP (2005); Breast surgery; Other surgical history; Mouth surgery; Lipoma excision; Laparoscopic vaginal hysterectomy with salpingo oophorectomy (N/A, 03/18/2015); Cystoscopy (N/A, 03/18/2015); Laparoscopic bilateral salpingectomy (Bilateral, 03/18/2015); Colonoscopy; and Kidney stone surgery. Her family history includes Anxiety disorder in her mother; Brain cancer in her maternal aunt; Breast cancer in her mother and paternal aunt; Cancer in her maternal aunt, maternal uncle, and paternal aunt; Cancer (age of onset: 71) in her mother; Colon cancer in her maternal aunt and paternal  uncle; Colon cancer (age of onset: 31) in her mother; Diabetes in her maternal grandfather; Heart disease in her paternal grandmother; Hyperlipidemia in her father; Hypertension in her father, paternal grandmother, and another family member; Stroke in her paternal grandmother; Uterine cancer in her maternal aunt. She  reports that she has never smoked. She has never used smokeless tobacco. She reports that she does not drink alcohol or use drugs. She has a current medication list which includes the following  prescription(s): albuterol sulfate, amphetamine-dextroamphetamine, benzonatate, fluticasone, loratadine, metformin, vitamin d (ergocalciferol), and fluoxetine, and the following Facility-Administered Medications: sodium chloride. Current Outpatient Medications on File Prior to Visit  Medication Sig Dispense Refill  . Albuterol Sulfate (PROAIR RESPICLICK) 123XX123 (90 Base) MCG/ACT AEPB Inhale 2 puffs into the lungs as needed.    . benzonatate (TESSALON) 100 MG capsule Take 1 capsule (100 mg total) by mouth 3 (three) times daily as needed for cough. 30 capsule 0  . fluticasone (FLONASE) 50 MCG/ACT nasal spray Place 2 sprays into both nostrils daily. 16 g 1  . loratadine (CLARITIN) 10 MG tablet Take 1 tablet (10 mg total) by mouth daily. 30 tablet 11  . metFORMIN (GLUCOPHAGE) 500 MG tablet Take 1 tablet (500 mg total) by mouth daily after lunch. 30 tablet 0  . Vitamin D, Ergocalciferol, (DRISDOL) 1.25 MG (50000 UT) CAPS capsule Take 1 capsule (50,000 Units total) by mouth every 7 (seven) days. 4 capsule 0   Current Facility-Administered Medications on File Prior to Visit  Medication Dose Route Frequency Provider Last Rate Last Admin  . 0.9 %  sodium chloride infusion  500 mL Intravenous Continuous Milus Banister, MD       She is allergic to codeine and other..  Review of Systems Review of Systems  Constitutional: Negative for activity change, appetite change and fatigue.  HENT: Negative for hearing loss, congestion, tinnitus and ear discharge.  dentist q71m Eyes: Negative for visual disturbance (see optho q1y -- vision corrected to 20/20 with glasses).  Respiratory: Negative for cough, chest tightness and shortness of breath.   Cardiovascular: Negative for chest pain, palpitations and leg swelling.  Gastrointestinal: Negative for abdominal pain, diarrhea, constipation and abdominal distention.  Genitourinary: Negative for urgency, frequency, decreased urine volume and difficulty urinating.   Musculoskeletal: Negative for back pain, arthralgias and gait problem.  Skin: Negative for color change, pallor and rash.  Neurological: Negative for dizziness, light-headedness, numbness and headaches.  Hematological: Negative for adenopathy. Does not bruise/bleed easily.  Psychiatric/Behavioral: Negative for suicidal ideas, confusion, sleep disturbance, self-injury, dysphoric mood, decreased concentration and agitation.       Objective:    BP 100/80 (BP Location: Left Arm, Patient Position: Sitting, Cuff Size: Normal)   Pulse 87   Temp 97.8 F (36.6 C) (Temporal)   Ht 5\' 7"  (1.702 m)   Wt 211 lb 9.6 oz (96 kg)   LMP 12/02/2014   SpO2 99%   BMI 33.14 kg/m  General appearance: alert, cooperative, appears stated age and no distress Head: Normocephalic, without obvious abnormality, atraumatic Eyes: negative findings: lids and lashes normal, conjunctivae and sclerae normal and pupils equal, round, reactive to light and accomodation Ears: normal TM's and external ear canals both ears Nose: Nares normal. Septum midline. Mucosa normal. No drainage or sinus tenderness. Throat: lips, mucosa, and tongue normal; teeth and gums normal Neck: no adenopathy, no carotid bruit, no JVD, supple, symmetrical, trachea midline and thyroid not enlarged, symmetric, no tenderness/mass/nodules Back: symmetric, no curvature. ROM normal. No CVA  tenderness. Lungs: clear to auscultation bilaterally Breasts: normal appearance, no masses or tenderness Heart: regular rate and rhythm, S1, S2 normal, no murmur, click, rub or gallop Abdomen: soft, non-tender; bowel sounds normal; no masses,  no organomegaly Pelvic: not indicated; status post hysterectomy, negative ROS Extremities: extremities normal, atraumatic, no cyanosis or edema Pulses: 2+ and symmetric Skin: Skin color, texture, turgor normal. No rashes or lesions Lymph nodes: Cervical, supraclavicular, and axillary nodes normal. Neurologic: Grossly  normal    Assessment:    Healthy female exam.      Plan:    ghm utd Check labs  See After Visit Summary for Counseling Recommendations    1. Attention deficit disorder (ADD) without hyperactivity Stable Refills meds  - amphetamine-dextroamphetamine (ADDERALL) 20 MG tablet; Take 1 tablet (20 mg total) by mouth 2 (two) times daily.  Dispense: 60 tablet; Refill: 0  2. Need for influenza vaccination  - Flu Vaccine QUAD 6+ mos PF IM (Fluarix Quad PF)  3. Menopausal depression F/u 1 month or sooner prn  - FLUoxetine (PROZAC) 10 MG capsule; Take 1 capsule (10 mg total) by mouth daily.  Dispense: 30 capsule; Refill: 3  4. Preventative health care See above  - SARS-COV-2 IgG - CBC with Differential - Lipid panel - TSH - Comprehensive metabolic panel  5. Morbid obesty [facility]) May consider healthy weight and wellness again - Hemoglobin A1c - Insulin, random - Vitamin D (25 hydroxy)

## 2019-05-01 LAB — INSULIN, RANDOM: Insulin: 8.3 u[IU]/mL

## 2019-05-06 ENCOUNTER — Other Ambulatory Visit: Payer: Self-pay | Admitting: Family Medicine

## 2019-05-06 DIAGNOSIS — E559 Vitamin D deficiency, unspecified: Secondary | ICD-10-CM

## 2019-05-06 MED ORDER — VITAMIN D (ERGOCALCIFEROL) 1.25 MG (50000 UNIT) PO CAPS: 50000.0000 [IU] | ORAL_CAPSULE | ORAL | 3 refills | Status: DC

## 2019-05-22 ENCOUNTER — Other Ambulatory Visit: Payer: Self-pay | Admitting: Family Medicine

## 2019-05-22 DIAGNOSIS — F3289 Other specified depressive episodes: Secondary | ICD-10-CM

## 2019-05-30 ENCOUNTER — Encounter: Payer: Self-pay | Admitting: Family Medicine

## 2019-05-30 ENCOUNTER — Ambulatory Visit (INDEPENDENT_AMBULATORY_CARE_PROVIDER_SITE_OTHER): Payer: PRIVATE HEALTH INSURANCE | Admitting: Family Medicine

## 2019-05-30 ENCOUNTER — Other Ambulatory Visit: Payer: Self-pay

## 2019-05-30 VITALS — Ht 67.0 in | Wt 209.0 lb

## 2019-05-30 DIAGNOSIS — F320 Major depressive disorder, single episode, mild: Secondary | ICD-10-CM | POA: Diagnosis not present

## 2019-05-30 MED ORDER — FLUOXETINE HCL 20 MG PO TABS: 20.0000 mg | ORAL_TABLET | Freq: Every day | ORAL | 3 refills | Status: DC

## 2019-05-30 NOTE — Progress Notes (Signed)
Virtual Visit via Video Note  I connected with Cheryl Patterson on 05/30/19 at 11:40 AM EST by a video enabled telemedicine application and verified that I am speaking with the correct person using two identifiers.  Location: Patient: home alone  Provider: home    I discussed the limitations of evaluation and management by telemedicine and the availability of in person appointments. The patient expressed understanding and agreed to proceed.  History of Present Illness:  pt is home f/u on depression / menopause symptoms -- she is doing better on prozac but would like to increase the dose     No complaints    Past Medical History:  Diagnosis Date  . ADD (attention deficit disorder)   . Allergy   . Anxiety   . Asthma    per pt- bronchitis brings on the symptoms  . Bipolar depression ([place])   . Chronic headaches   . Chronic kidney disease    kidney stones  . Constipation   . Depression    [facility]---suicidal ideation  . Dyspnea   . GERD (gastroesophageal reflux disease)   . Hyperlipidemia    no meds taken as of 04-20-16  . IBS (irritable bowel syndrome)   . Joint pain   . Kidney infection   . Lactose intolerance   . Prediabetes   . Ruptured disc, thoracic   . Sepsis ([place])    2014-  d/t urinary infection  . UTI (urinary tract infection)   . Vitamin D deficiency    Current Outpatient Medications on File Prior to Visit  Medication Sig Dispense Refill  . Albuterol Sulfate (PROAIR RESPICLICK) 123XX123 (90 Base) MCG/ACT AEPB Inhale 2 puffs into the lungs as needed.    Marland Kitchen amphetamine-dextroamphetamine (ADDERALL) 20 MG tablet Take 1 tablet (20 mg total) by mouth 2 (two) times daily. 60 tablet 0  . benzonatate (TESSALON) 100 MG capsule Take 1 capsule (100 mg total) by mouth 3 (three) times daily as needed for cough. 30 capsule 0  . fluticasone (FLONASE) 50 MCG/ACT nasal spray Place 2 sprays into both nostrils daily. 16 g 1  . loratadine (CLARITIN) 10 MG tablet Take 1 tablet  (10 mg total) by mouth daily. 30 tablet 11  . metFORMIN (GLUCOPHAGE) 500 MG tablet Take 1 tablet (500 mg total) by mouth daily after lunch. 30 tablet 0  . Vitamin D, Ergocalciferol, (DRISDOL) 1.25 MG (50000 UNIT) CAPS capsule Take 1 capsule (50,000 Units total) by mouth every 7 (seven) days. 12 capsule 3   Current Facility-Administered Medications on File Prior to Visit  Medication Dose Route Frequency Provider Last Rate Last Admin  . 0.9 %  sodium chloride infusion  500 mL Intravenous Continuous Milus Banister, MD       Allergies  Allergen Reactions  . Codeine     Itchy throat, whelps on skin  . Other Swelling and Rash    tomatoes    Observations/Objective:   Vitals:  wt 209 lb 5'7" Pt is in nad She is not suicidal / homicidal     Assessment and Plan: 1. Depression, major, single episode, mild (HCC) Increase prozac to 20 mg daily F/u in July with reg ov   We also discussed covid vaccine and questions were answered   - FLUoxetine (PROZAC) 20 MG tablet; Take 1 tablet (20 mg total) by mouth daily.  Dispense: 90 tablet; Refill: 3  Follow Up Instructions:    I discussed the assessment and treatment plan with the patient. The  patient was provided an opportunity to ask questions and all were answered. The patient agreed with the plan and demonstrated an understanding of the instructions.   The patient was advised to call back or seek an in-person evaluation if the symptoms worsen or if the condition fails to improve as anticipated.  I provided 30 minutes of non-face-to-face time during this encounter.   Ann Held, DO

## 2019-05-30 NOTE — Patient Instructions (Signed)
COVID-19 Vaccine Information can be found at: [email or web address] For questions related to vaccine distribution or appointments, please email [email or web address] or call [phone].    

## 2019-07-11 ENCOUNTER — Other Ambulatory Visit: Payer: Self-pay | Admitting: Family Medicine

## 2019-07-11 DIAGNOSIS — F988 Other specified behavioral and emotional disorders with onset usually occurring in childhood and adolescence: Secondary | ICD-10-CM

## 2019-07-11 MED ORDER — BENZONATATE 100 MG PO CAPS: 100.0000 mg | ORAL_CAPSULE | Freq: Three times a day (TID) | ORAL | 0 refills | Status: DC | PRN

## 2019-07-11 MED ORDER — AMPHETAMINE-DEXTROAMPHETAMINE 20 MG PO TABS: 20.0000 mg | ORAL_TABLET | Freq: Two times a day (BID) | ORAL | 0 refills | Status: DC

## 2019-07-11 NOTE — Telephone Encounter (Signed)
Last written: 04/30/19 Last ov: 05/30/19 Next ov: none Contract: 07/22/17 UDS: 10/24/17

## 2019-07-13 ENCOUNTER — Encounter: Payer: Self-pay | Admitting: Family Medicine

## 2019-07-13 NOTE — Telephone Encounter (Signed)
She will need ov

## 2019-07-25 ENCOUNTER — Other Ambulatory Visit: Payer: Self-pay

## 2019-07-26 ENCOUNTER — Ambulatory Visit (INDEPENDENT_AMBULATORY_CARE_PROVIDER_SITE_OTHER): Payer: PRIVATE HEALTH INSURANCE | Admitting: Family Medicine

## 2019-07-26 ENCOUNTER — Other Ambulatory Visit: Payer: Self-pay

## 2019-07-26 ENCOUNTER — Encounter: Payer: Self-pay | Admitting: Family Medicine

## 2019-07-26 VITALS — BP 110/78 | HR 89 | Temp 97.5°F | Resp 18 | Ht 67.0 in | Wt 212.2 lb

## 2019-07-26 DIAGNOSIS — J4520 Mild intermittent asthma, uncomplicated: Secondary | ICD-10-CM | POA: Diagnosis not present

## 2019-07-26 DIAGNOSIS — N63 Unspecified lump in unspecified breast: Secondary | ICD-10-CM

## 2019-07-26 DIAGNOSIS — Z87898 Personal history of other specified conditions: Secondary | ICD-10-CM

## 2019-07-26 MED ORDER — PROAIR RESPICLICK 108 (90 BASE) MCG/ACT IN AEPB: 2.0000 | INHALATION_SPRAY | RESPIRATORY_TRACT | 2 refills | Status: DC | PRN

## 2019-07-26 MED ORDER — BENZONATATE 100 MG PO CAPS: 100.0000 mg | ORAL_CAPSULE | Freq: Three times a day (TID) | ORAL | 0 refills | Status: DC | PRN

## 2019-07-26 NOTE — Assessment & Plan Note (Signed)
Stable Refill inhalers  

## 2019-07-26 NOTE — Progress Notes (Signed)
Patient ID: Cheryl Patterson, female    DOB: 1970/05/27  Age: 50 y.o. MRN: [id number]    Subjective:  Subjective  HPI Cheryl Patterson presents for soreness and mass in breast.  She has a hx of fibrocystic breasts but breast center wanted her to come here to have diagnostic ordered.  Pt states they do not feel any different then normal .    Review of Systems  Constitutional: Negative for appetite change, diaphoresis, fatigue and unexpected weight change.  Eyes: Negative for pain, redness and visual disturbance.  Respiratory: Negative for cough, chest tightness, shortness of breath and wheezing.   Cardiovascular: Negative for chest pain, palpitations and leg swelling.  Endocrine: Negative for cold intolerance, heat intolerance, polydipsia, polyphagia and polyuria.  Genitourinary: Negative for difficulty urinating, dysuria and frequency.  Neurological: Negative for dizziness, light-headedness, numbness and headaches.    History Past Medical History:  Diagnosis Date  . ADD (attention deficit disorder)   . Allergy   . Anxiety   . Asthma    per pt- bronchitis brings on the symptoms  . Bipolar depression ([place])   . Chronic headaches   . Chronic kidney disease    kidney stones  . Constipation   . Depression    [facility]---suicidal ideation  . Dyspnea   . GERD (gastroesophageal reflux disease)   . Hyperlipidemia    no meds taken as of 04-20-16  . IBS (irritable bowel syndrome)   . Joint pain   . Kidney infection   . Lactose intolerance   . Prediabetes   . Ruptured disc, thoracic   . Sepsis ([place])    2014-  d/t urinary infection  . UTI (urinary tract infection)   . Vitamin D deficiency     She has a past surgical history that includes Tubal ligation; LEEP (2005); Breast surgery; Other surgical history; Mouth surgery; Lipoma excision; Laparoscopic vaginal hysterectomy with salpingo oophorectomy (N/A, 03/18/2015); Cystoscopy (N/A, 03/18/2015); Laparoscopic bilateral  salpingectomy (Bilateral, 03/18/2015); Colonoscopy; and Kidney stone surgery.   Her family history includes Anxiety disorder in her mother; Brain cancer in her maternal aunt; Breast cancer in her mother and paternal aunt; Cancer in her maternal aunt, maternal uncle, and paternal aunt; Cancer (age of onset: 54) in her mother; Colon cancer in her maternal aunt and paternal uncle; Colon cancer (age of onset: 38) in her mother; Diabetes in her maternal grandfather; Heart disease in her paternal grandmother; Hyperlipidemia in her father; Hypertension in her father, paternal grandmother, and another family member; Stroke in her paternal grandmother; Uterine cancer in her maternal aunt.She reports that she has never smoked. She has never used smokeless tobacco. She reports that she does not drink alcohol or use drugs.  Current Outpatient Medications on File Prior to Visit  Medication Sig Dispense Refill  . amphetamine-dextroamphetamine (ADDERALL) 20 MG tablet Take 1 tablet (20 mg total) by mouth 2 (two) times daily. 60 tablet 0  . FLUoxetine (PROZAC) 20 MG tablet Take 1 tablet (20 mg total) by mouth daily. 90 tablet 3  . fluticasone (FLONASE) 50 MCG/ACT nasal spray Place 2 sprays into both nostrils daily. 16 g 1  . loratadine (CLARITIN) 10 MG tablet Take 1 tablet (10 mg total) by mouth daily. 30 tablet 11  . Vitamin D, Ergocalciferol, (DRISDOL) 1.25 MG (50000 UNIT) CAPS capsule Take 1 capsule (50,000 Units total) by mouth every 7 (seven) days. 12 capsule 3   Current Facility-Administered Medications on File Prior to Visit  Medication Dose  Route Frequency Provider Last Rate Last Admin  . 0.9 %  sodium chloride infusion  500 mL Intravenous Continuous Milus Banister, MD         Objective:  Objective  Physical Exam Vitals and nursing note reviewed.  Constitutional:      Appearance: She is well-developed.  HENT:     Head: Normocephalic and atraumatic.  Eyes:     Conjunctiva/sclera: Conjunctivae  normal.  Neck:     Thyroid: No thyromegaly.     Vascular: No carotid bruit or JVD.  Cardiovascular:     Rate and Rhythm: Normal rate and regular rhythm.     Heart sounds: Normal heart sounds. No murmur.  Pulmonary:     Effort: Pulmonary effort is normal. No respiratory distress.     Breath sounds: Normal breath sounds. No wheezing or rales.  Chest:     Chest wall: No tenderness.     Breasts:        Right: Mass and tenderness present. No bleeding, inverted nipple, nipple discharge or skin change.        Left: Mass present. No bleeding, inverted nipple, nipple discharge, skin change or tenderness.    Musculoskeletal:     Cervical back: Normal range of motion and neck supple.  Neurological:     Mental Status: She is alert and oriented to person, place, and time.    BP 110/78 (BP Location: Right Arm, Patient Position: Sitting, Cuff Size: Normal)   Pulse 89   Temp (!) 97.5 F (36.4 C) (Temporal)   Resp 18   Ht 5\' 7"  (1.702 m)   Wt 212 lb 3.2 oz (96.3 kg)   LMP 12/02/2014   SpO2 96%   BMI 33.24 kg/m  Wt Readings from Last 3 Encounters:  07/26/19 212 lb 3.2 oz (96.3 kg)  05/30/19 209 lb (94.8 kg)  04/30/19 211 lb 9.6 oz (96 kg)     Lab Results  Component Value Date   WBC 5.4 04/30/2019   HGB 13.3 04/30/2019   HCT 40.6 04/30/2019   PLT 289.0 04/30/2019   GLUCOSE 96 04/30/2019   CHOL 223 (H) 04/30/2019   TRIG 84.0 04/30/2019   HDL 43.40 04/30/2019   LDLDIRECT 144.3 06/25/2010   LDLCALC 163 (H) 04/30/2019   ALT 10 04/30/2019   AST 13 04/30/2019   NA 138 04/30/2019   K 4.3 04/30/2019   CL 102 04/30/2019   CREATININE 0.83 04/30/2019   BUN 8 04/30/2019   CO2 29 04/30/2019   TSH 1.40 04/30/2019   HGBA1C 5.9 04/30/2019    DG Chest 2 View  Result Date: 10/24/2017 CLINICAL DATA:  Cough congestion EXAM: CHEST - 2 VIEW COMPARISON:  12/11/2015 FINDINGS: The heart size and mediastinal contours are within normal limits. Both lungs are clear. The visualized skeletal  structures are unremarkable. IMPRESSION: No active cardiopulmonary disease. Electronically Signed   By: Franchot Gallo M.D.   On: 10/24/2017 15:09     Assessment & Plan:  Plan  I have discontinued Cheryl Patterson's metFORMIN. I am also having her maintain her loratadine, fluticasone, Vitamin D (Ergocalciferol), FLUoxetine, amphetamine-dextroamphetamine, benzonatate, and ProAir RespiClick. We will continue to administer sodium chloride.  Meds ordered this encounter  Medications  . benzonatate (TESSALON) 100 MG capsule    Sig: Take 1 capsule (100 mg total) by mouth 3 (three) times daily as needed for cough.    Dispense:  30 capsule    Refill:  0  . Albuterol Sulfate (PROAIR RESPICLICK) 123XX123 (90  Base) MCG/ACT AEPB    Sig: Inhale 2 puffs into the lungs as needed.    Dispense:  1 each    Refill:  2    Problem List Items Addressed This Visit    None    Visit Diagnoses    Mild intermittent asthma, unspecified whether complicated    -  Primary   Relevant Medications   benzonatate (TESSALON) 100 MG capsule   Albuterol Sulfate (PROAIR RESPICLICK) 123XX123 (90 Base) MCG/ACT AEPB   Breast mass in female       Relevant Orders   MM Digital Diagnostic Bilat   US BREAST LTD UNI LEFT INC AXILLA   US BREAST LTD UNI RIGHT INC AXILLA   History of fibrocystic disease of breast       Relevant Orders   MM Digital Diagnostic Bilat   US BREAST LTD UNI LEFT INC AXILLA   US BREAST LTD UNI RIGHT INC AXILLA      Follow-up: Return if symptoms worsen or fail to improve.  Ann Held, DO

## 2019-07-26 NOTE — Patient Instructions (Signed)
Fibrocystic Breast Changes  Fibrocystic breast changes are changes in breast tissue that can cause breasts to become swollen, lumpy, or painful. This can happen due to buildup of scar-like tissue (fibrous tissue) or the forming of fluid-filled lumps (cysts) in the breast. This is a common condition, and it is not cancerous (is benign). The exact cause is not known, but it seems to occur when women go through hormonal changes during their menstrual cycle. Fibrocystic breast changes can affect one or both breasts. What are the causes? The exact cause of fibrocystic breast changes is not known. However, this condition:  May be related to the female hormones estrogen and progesterone.  May be influenced by family traits that get passed from parent to child (genetics). What are the signs or symptoms? Symptoms of this condition may affect one or both breasts, and may include:  Tenderness, mild discomfort, or pain.  Swelling.  Rope-like tissue that can be felt when touching the breast.  Lumps in one or both breasts.  Changes in breast size. Breasts may get larger before the menstrual period and smaller after the menstrual period.  Green or dark brown discharge from the nipple. Symptoms are usually worse before menstrual periods start, and they get better toward the end of menstrual periods. How is this diagnosed? This condition is diagnosed based on your medical history and a physical exam of your breasts. You may also have tests, such as:  A breast X-ray (mammogram).  Ultrasound of your breasts.  MRI.  Removal of a breast tissue sample for testing (breast biopsy). This may be done if your health care provider thinks that something else may be causing changes in your breasts. How is this treated? Often, treatment is not needed for this condition. In some cases, treatment may include:  Taking over-the-counter pain relievers to help lessen pain or discomfort.  Limiting or avoiding  caffeine. Foods and beverages that contain caffeine include chocolate, soda, coffee, and tea.  Reducing sugar and fat in your diet. Your health care provider may also recommend:  A procedure to remove fluid from a cyst that is causing pain (fine needle aspiration).  Surgery to remove a cyst that is large or tender or does not go away. Follow these instructions at home:  Examine your breasts after every menstrual period. If you do not have menstrual periods, check your breasts on the first day of every month. Feel for changes in your breasts, such as: ? More tenderness. ? A new growth. ? A change in size. ? A change in an existing lump.  Take over-the-counter and prescription medicines only as told by your health care provider.  Wear a well-fitted support or sports bra, especially when exercising.  Decrease or avoid caffeine, fat, and sugar in your diet as directed by your health care provider. Contact a health care provider if:  You have fluid leaking from your nipple, especially if it is bloody.  You have new lumps or bumps in your breast.  Your breast becomes enlarged, red, and painful.  You have areas of your breast that pucker inward.  Your nipple appears flat or indented. Get help right away if:  You have redness of your breast and the redness is spreading. Summary  Fibrocystic breast changes are changes in breast tissue that can cause breasts to become swollen, lumpy, or painful.  This condition may be related to the female hormones estrogen and progesterone.  With this condition, it is important to examine your breasts after every   menstrual period. If you do not have menstrual periods, check your breasts on the first day of every month. This information is not intended to replace advice given to you by your health care provider. Make sure you discuss any questions you have with your health care provider. Document Revised: 03/18/2017 Document Reviewed:  12/03/2015 Elsevier Patient Education  2020 [employer]  

## 2019-07-27 ENCOUNTER — Other Ambulatory Visit: Payer: Self-pay

## 2019-07-27 DIAGNOSIS — J4520 Mild intermittent asthma, uncomplicated: Secondary | ICD-10-CM

## 2019-07-27 MED ORDER — ALBUTEROL SULFATE HFA 108 (90 BASE) MCG/ACT IN AERS: 2.0000 | INHALATION_SPRAY | Freq: Four times a day (QID) | RESPIRATORY_TRACT | 5 refills | Status: DC | PRN

## 2019-07-28 ENCOUNTER — Ambulatory Visit: Payer: PRIVATE HEALTH INSURANCE | Attending: Internal Medicine

## 2019-07-28 DIAGNOSIS — Z23 Encounter for immunization: Secondary | ICD-10-CM

## 2019-07-28 NOTE — Progress Notes (Signed)
   Covid-19 Vaccination Clinic  Name:  Cheryl Patterson    MRN: [id number] DOB: 11-12-1970  07/28/2019  Cheryl Patterson was observed post Covid-19 immunization for 15 minutes without incident. She was provided with Vaccine Information Sheet and instruction to access the V-Safe system.   Cheryl Patterson was instructed to call 911 with any severe reactions post vaccine: Marland Kitchen Difficulty breathing  . Swelling of face and throat  . A fast heartbeat  . A bad rash all over body  . Dizziness and weakness   Immunizations Administered    Name Date Dose VIS Date Route   Moderna COVID-19 Vaccine 07/28/2019  2:14 PM 0.5 mL 03/20/2019 Intramuscular   Manufacturer: Moderna   [id number]   [place]:[id number]

## 2019-08-06 ENCOUNTER — Encounter: Payer: Self-pay | Admitting: Family Medicine

## 2019-08-07 ENCOUNTER — Other Ambulatory Visit: Payer: Self-pay

## 2019-08-07 ENCOUNTER — Telehealth (INDEPENDENT_AMBULATORY_CARE_PROVIDER_SITE_OTHER): Payer: PRIVATE HEALTH INSURANCE | Admitting: Family Medicine

## 2019-08-07 ENCOUNTER — Encounter: Payer: Self-pay | Admitting: Family Medicine

## 2019-08-07 VITALS — Temp 98.4°F | Ht 67.0 in | Wt 210.0 lb

## 2019-08-07 DIAGNOSIS — J029 Acute pharyngitis, unspecified: Secondary | ICD-10-CM | POA: Diagnosis not present

## 2019-08-07 DIAGNOSIS — N760 Acute vaginitis: Secondary | ICD-10-CM

## 2019-08-07 MED ORDER — FLUCONAZOLE 150 MG PO TABS: ORAL_TABLET | ORAL | 0 refills | Status: DC

## 2019-08-07 MED ORDER — PROMETHAZINE-DM 6.25-15 MG/5ML PO SYRP: 5.0000 mL | ORAL_SOLUTION | Freq: Four times a day (QID) | ORAL | 0 refills | Status: DC | PRN

## 2019-08-07 MED ORDER — AMOXICILLIN-POT CLAVULANATE 875-125 MG PO TABS: 1.0000 | ORAL_TABLET | Freq: Two times a day (BID) | ORAL | 0 refills | Status: DC

## 2019-08-07 NOTE — Progress Notes (Signed)
Virtual Visit via Video Note  I connected with Cheryl Patterson on 08/07/19 at  3:20 PM EDT by a video enabled telemedicine application and verified that I am speaking with the correct person using two identifiers.  Location: Patient: home alone  Provider: office    I discussed the limitations of evaluation and management by telemedicine and the availability of in person appointments. The patient expressed understanding and agreed to proceed.  History of Present Illness: Pt is home c/o sore throat since Sunday---- she also has body aches and low grade fever 99.1  + chills  + congestion and cough that is keeping her up  + productive cough    Observations/Objective: Today's Vitals   08/07/19 1524  Temp: 98.4 F (36.9 C)  Weight: 210 lb (95.3 kg)  Height: 5\' 7"  (1.702 m)   Body mass index is 32.89 kg/m. Pt is in NAd  No sob   Assessment and Plan: 1. Pharyngitis, unspecified etiology abx and cough med per orders  covid test  - promethazine-dextromethorphan (PROMETHAZINE-DM) 6.25-15 MG/5ML syrup; Take 5 mLs by mouth 4 (four) times daily as needed.  Dispense: 118 mL; Refill: 0 - amoxicillin-clavulanate (AUGMENTIN) 875-125 MG tablet; Take 1 tablet by mouth 2 (two) times daily.  Dispense: 20 tablet; Refill: 0  2. Acute vaginitis  - fluconazole (DIFLUCAN) 150 MG tablet; 1 po x1, may repeat in 3 days prn  Dispense: 2 tablet; Refill: 0  Follow Up Instructions:    I discussed the assessment and treatment plan with the patient. The patient was provided an opportunity to ask questions and all were answered. The patient agreed with the plan and demonstrated an understanding of the instructions.   The patient was advised to call back or seek an in-person evaluation if the symptoms worsen or if the condition fails to improve as anticipated.  I provided 25 minutes of non-face-to-face time during this encounter.   Ann Held, DO

## 2019-08-13 ENCOUNTER — Other Ambulatory Visit: Payer: PRIVATE HEALTH INSURANCE

## 2019-08-25 ENCOUNTER — Ambulatory Visit: Payer: PRIVATE HEALTH INSURANCE

## 2019-09-12 ENCOUNTER — Encounter: Payer: Self-pay | Admitting: Family Medicine

## 2019-09-13 ENCOUNTER — Other Ambulatory Visit: Payer: Self-pay | Admitting: Family Medicine

## 2019-09-13 DIAGNOSIS — F988 Other specified behavioral and emotional disorders with onset usually occurring in childhood and adolescence: Secondary | ICD-10-CM

## 2019-09-13 MED ORDER — AMPHETAMINE-DEXTROAMPHETAMINE 20 MG PO TABS: 20.0000 mg | ORAL_TABLET | Freq: Two times a day (BID) | ORAL | 0 refills | Status: DC

## 2019-09-13 NOTE — Telephone Encounter (Signed)
**  Please see Mychart message regarding missed COVID shot**  Requesting: Adderall Contract: 10/24/2017 UDS: 10/24/2017, low risk Last OV: 07/26/2019 Next OV: N/A Last Refill: 07/11/2019, #60--0 RF Database:   Please advise

## 2019-09-13 NOTE — Telephone Encounter (Signed)
No -- she can just schedule 2nd dose

## 2019-10-08 ENCOUNTER — Other Ambulatory Visit: Payer: Self-pay | Admitting: Family Medicine

## 2019-10-08 ENCOUNTER — Ambulatory Visit
Admission: RE | Admit: 2019-10-08 | Discharge: 2019-10-08 | Disposition: A | Discharge status: Home / Self Care | Payer: PRIVATE HEALTH INSURANCE | Source: Ambulatory Visit | Attending: Family Medicine | Admitting: Family Medicine

## 2019-10-08 ENCOUNTER — Ambulatory Visit
Admission: RE | Admit: 2019-10-08 | Discharge: 2019-10-08 | Disposition: A | Discharge status: Home / Self Care | Payer: 59 | Source: Ambulatory Visit | Attending: Family Medicine | Admitting: Family Medicine

## 2019-10-08 ENCOUNTER — Other Ambulatory Visit: Payer: Self-pay

## 2019-10-08 DIAGNOSIS — N63 Unspecified lump in unspecified breast: Secondary | ICD-10-CM

## 2019-10-08 DIAGNOSIS — Z87898 Personal history of other specified conditions: Secondary | ICD-10-CM

## 2019-10-08 DIAGNOSIS — R599 Enlarged lymph nodes, unspecified: Secondary | ICD-10-CM

## 2019-11-07 ENCOUNTER — Other Ambulatory Visit: Payer: Self-pay | Admitting: Family Medicine

## 2019-11-07 ENCOUNTER — Ambulatory Visit
Admission: RE | Admit: 2019-11-07 | Discharge: 2019-11-07 | Disposition: A | Discharge status: Home / Self Care | Payer: PRIVATE HEALTH INSURANCE | Source: Ambulatory Visit | Attending: Family Medicine | Admitting: Family Medicine

## 2019-11-07 ENCOUNTER — Other Ambulatory Visit: Payer: Self-pay

## 2019-11-07 DIAGNOSIS — R599 Enlarged lymph nodes, unspecified: Secondary | ICD-10-CM

## 2019-11-18 ENCOUNTER — Encounter: Payer: Self-pay | Admitting: Family Medicine

## 2019-11-18 DIAGNOSIS — F988 Other specified behavioral and emotional disorders with onset usually occurring in childhood and adolescence: Secondary | ICD-10-CM

## 2019-11-19 MED ORDER — AMPHETAMINE-DEXTROAMPHETAMINE 20 MG PO TABS: 20.0000 mg | ORAL_TABLET | Freq: Two times a day (BID) | ORAL | 0 refills | Status: DC

## 2019-11-19 NOTE — Telephone Encounter (Signed)
Requesting: Adderall Contract: 07/22/2017 UDS: 10/24/2017 Last OV: 08/07/19 virtual Next OV: N/A Last Refill: 09/13/2019, #60--0 RF Database:   Please advise

## 2019-11-20 ENCOUNTER — Other Ambulatory Visit: Payer: Self-pay | Admitting: Family Medicine

## 2019-11-20 DIAGNOSIS — F988 Other specified behavioral and emotional disorders with onset usually occurring in childhood and adolescence: Secondary | ICD-10-CM

## 2019-12-10 ENCOUNTER — Ambulatory Visit
Admission: RE | Admit: 2019-12-10 | Discharge: 2019-12-10 | Disposition: A | Discharge status: Home / Self Care | Payer: PRIVATE HEALTH INSURANCE | Source: Ambulatory Visit | Attending: Family Medicine | Admitting: Family Medicine

## 2019-12-10 ENCOUNTER — Other Ambulatory Visit: Payer: Self-pay

## 2019-12-10 ENCOUNTER — Other Ambulatory Visit: Payer: Self-pay | Admitting: Family Medicine

## 2019-12-10 ENCOUNTER — Encounter: Payer: Self-pay | Admitting: Family Medicine

## 2019-12-10 DIAGNOSIS — R599 Enlarged lymph nodes, unspecified: Secondary | ICD-10-CM

## 2019-12-11 NOTE — Telephone Encounter (Signed)
Provider notified

## 2019-12-20 ENCOUNTER — Other Ambulatory Visit: Payer: Self-pay

## 2019-12-20 ENCOUNTER — Ambulatory Visit
Admission: RE | Admit: 2019-12-20 | Discharge: 2019-12-20 | Disposition: A | Discharge status: Home / Self Care | Payer: PRIVATE HEALTH INSURANCE | Source: Ambulatory Visit | Attending: Family Medicine | Admitting: Family Medicine

## 2019-12-20 ENCOUNTER — Other Ambulatory Visit (HOSPITAL_COMMUNITY)
Admission: RE | Admit: 2019-12-20 | Discharge: 2019-12-20 | Disposition: A | Discharge status: Home / Self Care | Payer: PRIVATE HEALTH INSURANCE | Source: Ambulatory Visit | Attending: Body Imaging | Admitting: Body Imaging

## 2019-12-20 ENCOUNTER — Other Ambulatory Visit: Payer: Self-pay | Admitting: Family Medicine

## 2019-12-20 DIAGNOSIS — R599 Enlarged lymph nodes, unspecified: Secondary | ICD-10-CM

## 2019-12-25 LAB — SURGICAL PATHOLOGY

## 2020-01-10 ENCOUNTER — Telehealth (INDEPENDENT_AMBULATORY_CARE_PROVIDER_SITE_OTHER): Payer: PRIVATE HEALTH INSURANCE | Admitting: Family Medicine

## 2020-01-10 ENCOUNTER — Encounter: Payer: Self-pay | Admitting: Family Medicine

## 2020-01-10 DIAGNOSIS — G5601 Carpal tunnel syndrome, right upper limb: Secondary | ICD-10-CM | POA: Diagnosis not present

## 2020-01-10 DIAGNOSIS — F988 Other specified behavioral and emotional disorders with onset usually occurring in childhood and adolescence: Secondary | ICD-10-CM | POA: Diagnosis not present

## 2020-01-10 MED ORDER — BENZONATATE 100 MG PO CAPS
200.0000 mg | ORAL_CAPSULE | Freq: Three times a day (TID) | ORAL | 0 refills | Status: DC | PRN
Start: 2020-01-10 — End: 2020-03-20

## 2020-01-10 MED ORDER — AMPHETAMINE-DEXTROAMPHETAMINE 20 MG PO TABS: 20.0000 mg | ORAL_TABLET | Freq: Two times a day (BID) | ORAL | 0 refills | Status: DC

## 2020-01-10 NOTE — Progress Notes (Signed)
Virtual Visit via Video Note  I connected with Cheryl Patterson on 01/10/20 at  4:00 PM EDT by a video enabled telemedicine application and verified that I am speaking with the correct person using two identifiers.  Location:people in visit  Patient: work alone  Provider: home    I discussed the limitations of evaluation and management by telemedicine and the availability of in person appointments. The patient expressed understanding and agreed to proceed.  History of Present Illness: pt is at work and c/o R hand/ wrist pain x  1 week.  She woke up one day with severe pain and numbness in L hand and most of the arm but she feels it is mostly in the hand  Pt also needs a refill on her adderall and cough meds ---- she has started with her night time cough    Observations/Objective: There were no vitals filed for this visit. pt is in NAD   Assessment and Plan: 1. Attention deficit disorder (ADD) without hyperactivity Stable con't meds  - amphetamine-dextroamphetamine (ADDERALL) 20 MG tablet; Take 1 tablet (20 mg total) by mouth 2 (two) times daily.  Dispense: 60 tablet; Refill: 0  2. Carpal tunnel syndrome of right wrist Wrist splint at night Tylenol  Ice prn  Consider hand surgeon if needed     Follow Up Instructions:    I discussed the assessment and treatment plan with the patient. The patient was provided an opportunity to ask questions and all were answered. The patient agreed with the plan and demonstrated an understanding of the instructions.   The patient was advised to call back or seek an in-person evaluation if the symptoms worsen or if the condition fails to improve as anticipated.  I provided 25 minutes of non-face-to-face time during this encounter.   Ann Held, DO

## 2020-01-10 NOTE — Assessment & Plan Note (Signed)
Wrist splint at night Tylenol  Ice prn  Consider hand surgeon if needed

## 2020-02-26 ENCOUNTER — Other Ambulatory Visit: Payer: Self-pay

## 2020-02-26 ENCOUNTER — Telehealth (INDEPENDENT_AMBULATORY_CARE_PROVIDER_SITE_OTHER): Payer: PRIVATE HEALTH INSURANCE | Admitting: Family Medicine

## 2020-02-26 ENCOUNTER — Encounter: Payer: Self-pay | Admitting: Family Medicine

## 2020-02-26 DIAGNOSIS — J40 Bronchitis, not specified as acute or chronic: Secondary | ICD-10-CM | POA: Diagnosis not present

## 2020-02-26 MED ORDER — AZITHROMYCIN 250 MG PO TABS: ORAL_TABLET | ORAL | 0 refills | Status: DC

## 2020-02-26 MED ORDER — PROMETHAZINE HCL 25 MG PO TABS: 25.0000 mg | ORAL_TABLET | Freq: Three times a day (TID) | ORAL | 0 refills | Status: DC | PRN

## 2020-02-26 MED ORDER — FLUCONAZOLE 150 MG PO TABS: ORAL_TABLET | ORAL | 0 refills | Status: DC

## 2020-02-26 NOTE — Progress Notes (Signed)
Virtual Visit via Video Note  I connected with Cheryl Patterson on 02/26/20 at  4:00 PM EST by a video enabled telemedicine application and verified that I am speaking with the correct person using two identifiers.  Location/ persons involved  Patient: in car Provider: office    I discussed the limitations of evaluation and management by telemedicine and the availability of in person appointments. The patient expressed understanding and agreed to proceed.  History of Present Illness: Pt is in her car c/o congestion and cough since Wednesday.   + diarrhea, cough and productive yellow/ green mucus.    + fever ---  100  Observations/Objective: Temp 100-  No other vitals obtained  Pt sounds congested but is in Nad   Assessment and Plan: 1. Bronchitis z pak,  Cough meds per orders  rto prn if covid test neg  - azithromycin (ZITHROMAX Z-PAK) 250 MG tablet; As directed  Dispense: 6 each; Refill: 0 - promethazine (PHENERGAN) 25 MG tablet; Take 1 tablet (25 mg total) by mouth every 8 (eight) hours as needed for nausea or vomiting.  Dispense: 20 tablet; Refill: 0   Follow Up Instructions:    I discussed the assessment and treatment plan with the patient. The patient was provided an opportunity to ask questions and all were answered. The patient agreed with the plan and demonstrated an understanding of the instructions.   The patient was advised to call back or seek an in-person evaluation if the symptoms worsen or if the condition fails to improve as anticipated.  I provided 25 minutes of non-face-to-face time during this encounter.   Ann Held, DO

## 2020-02-27 ENCOUNTER — Other Ambulatory Visit: Payer: Self-pay | Admitting: Family Medicine

## 2020-02-27 ENCOUNTER — Encounter: Payer: Self-pay | Admitting: Family Medicine

## 2020-02-27 ENCOUNTER — Telehealth: Payer: Self-pay

## 2020-02-27 DIAGNOSIS — J029 Acute pharyngitis, unspecified: Secondary | ICD-10-CM

## 2020-02-27 MED ORDER — PROMETHAZINE-DM 6.25-15 MG/5ML PO SYRP: 5.0000 mL | ORAL_SOLUTION | Freq: Four times a day (QID) | ORAL | 0 refills | Status: DC | PRN

## 2020-02-27 NOTE — Telephone Encounter (Signed)
Pt is wanting cough syrup. I sent a message with a pended cough syrup

## 2020-02-27 NOTE — Telephone Encounter (Signed)
Patient calling to check status of cough syrup she is requesting.

## 2020-02-27 NOTE — Telephone Encounter (Signed)
Pt called. Pt notified of medication change.

## 2020-02-27 NOTE — Telephone Encounter (Signed)
Phenergan dm was sent in yesterday

## 2020-02-27 NOTE — Telephone Encounter (Signed)
Phenergan dm is cough syrup

## 2020-02-27 NOTE — Telephone Encounter (Signed)
PCP sent in tablets and patient wanted cough syrup. Pt made aware of medication change. Med sent to the pharmacy

## 2020-02-27 NOTE — Telephone Encounter (Signed)
Nurse Assessment Nurse: Alwyn Ren, RN, Anne Date/Time Eilene Ghazi Time): 02/26/2020 10:40:05 PM Confirm and document reason for call. If symptomatic, describe symptoms. ---Caller states she had an virtual appt today but the pharmacy did not have the cough medication. She cannot sleep because of the cough. The [facility] phone number is [phone] Does the patient have any new or worsening symptoms? ---Yes Will a triage be completed? ---Yes Related visit to physician within the last 2 weeks? ---Yes Does the PT have any chronic conditions? (i.e. diabetes, asthma, this includes High risk factors for pregnancy, etc.) ---Yes List chronic conditions. ---ADD Is the patient pregnant or possibly pregnant? (Ask all females between the ages of 67-55) ---No Is this a behavioral health or substance abuse call? ---No Guidelines Guideline Title Affirmed Question Affirmed Notes Nurse Date/Time (Eastern Time) Cough - Acute Productive Cough with cold symptoms (e.g., runny nose, postnasal drip, throat clearing) Nancie Neas 02/26/2020 10:42:30 PM Disp. Time Eilene Ghazi Time) Disposition Final User 02/26/2020 10:48:38 PM Home Care Yes Alwyn Ren, RN, Webb Silversmith PLEASE NOTE: All timestamps contained within this report are represented as [place] Standard Time. CONFIDENTIALTY NOTICE: This fax transmission is intended only for the addressee. It contains information that is legally privileged, confidential or otherwise protected from use or disclosure. If you are not the intended recipient, you are strictly prohibited from reviewing, disclosing, copying using or disseminating any of this information or taking any action in reliance on or regarding this information. If you have received this fax in error, please notify us immediately by telephone so that we can arrange for its return to [place]. Phone: [phone], Toll-Free: [phone], Fax: [phone] Page: 2 of 2 Call Id: [id number] Yoder Disagree/Comply Comply Caller  Understands Yes PreDisposition Call Doctor Care Advice Given Per Guideline HOME CARE: REASSURANCE AND EDUCATION - COUGH WITH COMMON COLD SYMPTOMS: COUGH MEDICINES: CALL BACK IF: * You become worse * Earache or facial pain develops * Fever lasts over 3 days * Nasal discharge lasts over 10 days CARE ADVICE given per Cough - Acute Productive (Adult) guideline. Comments User: Estelle June, RN Date/Time Eilene Ghazi Time): 02/26/2020 10:44:38 PM Pt dx in appt today with a viral upper respiratory infection

## 2020-03-20 ENCOUNTER — Other Ambulatory Visit: Payer: Self-pay | Admitting: Family Medicine

## 2020-03-20 DIAGNOSIS — F988 Other specified behavioral and emotional disorders with onset usually occurring in childhood and adolescence: Secondary | ICD-10-CM

## 2020-03-21 ENCOUNTER — Telehealth: Payer: Self-pay | Admitting: Medical

## 2020-03-21 MED ORDER — BENZONATATE 100 MG PO CAPS: 200.0000 mg | ORAL_CAPSULE | Freq: Three times a day (TID) | ORAL | 0 refills | Status: DC | PRN

## 2020-03-21 MED ORDER — AMPHETAMINE-DEXTROAMPHETAMINE 20 MG PO TABS: 20.0000 mg | ORAL_TABLET | Freq: Two times a day (BID) | ORAL | 0 refills | Status: DC

## 2020-03-21 NOTE — Telephone Encounter (Signed)
Requesting:(ADDERALL) 20 MG tablet  Contract:11/03/17 UDS:11/03/17 Last Visit:02/26/20 Next Visit:none Last Refill:01/10/20  Please Advise

## 2020-03-21 NOTE — Telephone Encounter (Signed)
Control routed to covering provider

## 2020-03-21 NOTE — Telephone Encounter (Signed)
Pt called  and informed. Will make sure she schedules with pcp to update those areas

## 2020-03-21 NOTE — Telephone Encounter (Signed)
Pt contract and uds not up to date. I refilled her adderall. But ask her to get scheduled with pcp so she can get updated.

## 2020-04-14 ENCOUNTER — Other Ambulatory Visit: Payer: Self-pay | Admitting: Family Medicine

## 2020-04-14 DIAGNOSIS — E559 Vitamin D deficiency, unspecified: Secondary | ICD-10-CM

## 2020-04-17 ENCOUNTER — Other Ambulatory Visit: Payer: PRIVATE HEALTH INSURANCE

## 2020-04-17 ENCOUNTER — Other Ambulatory Visit: Payer: Self-pay

## 2020-04-17 DIAGNOSIS — Z20822 Contact with and (suspected) exposure to covid-19: Secondary | ICD-10-CM

## 2020-04-17 NOTE — Addendum Note (Signed)
Addended by: Hilda Lias on: 04/17/2020 11:31 AM   Modules accepted: Orders

## 2020-04-18 LAB — NOVEL CORONAVIRUS, NAA: SARS-CoV-2, NAA: DETECTED — AB

## 2020-04-18 LAB — SARS-COV-2, NAA 2 DAY TAT

## 2020-04-21 ENCOUNTER — Encounter: Payer: Self-pay | Admitting: Family Medicine

## 2020-04-21 ENCOUNTER — Other Ambulatory Visit: Payer: Self-pay | Admitting: Family Medicine

## 2020-04-21 DIAGNOSIS — E559 Vitamin D deficiency, unspecified: Secondary | ICD-10-CM

## 2020-04-21 NOTE — Telephone Encounter (Signed)
I put referral in for infusion clinic--- but we can schedule for covid clinic

## 2020-04-23 ENCOUNTER — Telehealth (INDEPENDENT_AMBULATORY_CARE_PROVIDER_SITE_OTHER): Payer: PRIVATE HEALTH INSURANCE | Admitting: Family Medicine

## 2020-04-23 ENCOUNTER — Encounter: Payer: Self-pay | Admitting: Family Medicine

## 2020-04-23 ENCOUNTER — Other Ambulatory Visit: Payer: Self-pay

## 2020-04-23 DIAGNOSIS — U071 COVID-19: Secondary | ICD-10-CM | POA: Diagnosis not present

## 2020-04-23 DIAGNOSIS — J4520 Mild intermittent asthma, uncomplicated: Secondary | ICD-10-CM

## 2020-04-23 MED ORDER — PREDNISONE 20 MG PO TABS: 40.0000 mg | ORAL_TABLET | Freq: Every day | ORAL | 0 refills | Status: AC

## 2020-04-23 MED ORDER — ALBUTEROL SULFATE HFA 108 (90 BASE) MCG/ACT IN AERS: 2.0000 | INHALATION_SPRAY | Freq: Four times a day (QID) | RESPIRATORY_TRACT | 5 refills | Status: DC | PRN

## 2020-04-23 NOTE — Progress Notes (Signed)
Chief Complaint  Patient presents with  . Cough  . URI  . Nasal Congestion  . Shortness of Breath  . Covid Positive    Tested positive on 04/17/20    Cheryl Patterson here for URI complaints. Due to COVID-19 pandemic, we are interacting via web portal for an electronic face-to-face visit. I verified patient's ID using 2 identifiers. Patient agreed to proceed with visit via this method. Patient is at home, I am at office. Patient and I are present for visit.   Duration: 10 days  Associated symptoms: sinus congestion, sinus pain, rhinorrhea, painful ears, itchy watery eyes, shortness of breath, myalgia, nausea and cough Denies: sinus pain, ear fullness, ear drainage, sore throat, wheezing and fevers, diarrhea, vomiting Treatment to date: Albuterol, Mucinex Sick contacts: Yes  Tested + for covid on 12/30.  She is vaccinated with Moderna, did not get the booster.    Past Medical History:  Diagnosis Date  . ADD (attention deficit disorder)   . Allergy   . Anxiety   . Asthma    per pt- bronchitis brings on the symptoms  . Bipolar depression (HCC)   . Chronic headaches   . Chronic kidney disease    kidney stones  . Constipation   . Depression    [facility]---suicidal ideation  . Dyspnea   . GERD (gastroesophageal reflux disease)   . Hyperlipidemia    no meds taken as of 04-20-16  . IBS (irritable bowel syndrome)   . Joint pain   . Kidney infection   . Lactose intolerance   . Prediabetes   . Ruptured disc, thoracic   . Sepsis (HCC)    2014-  d/t urinary infection  . UTI (urinary tract infection)   . Vitamin D deficiency    Exam No conversational dyspnea Age appropriate judgment and insight Nml affect and mood  COVID-19 - Plan: predniSONE (DELTASONE) 20 MG tablet  Asthma, chronic, mild intermittent, uncomplicated - Plan: albuterol (VENTOLIN HFA) 108 (90 Base) MCG/ACT inhaler  5 d pred burst of 40 mg/d.  Continue to push fluids, practice good hand hygiene,  cover mouth when coughing. F/u prn. If starting to experience fevers, shaking, or shortness of breath, seek immediate care. Pt voiced understanding and agreement to the plan.  Jilda Roche [place], DO 04/23/20 2:00 PM

## 2020-04-23 NOTE — Telephone Encounter (Signed)
We can do a virtual visit and get tx for her

## 2020-04-30 NOTE — Telephone Encounter (Signed)
Can refer to covid clinic Friday

## 2020-04-30 NOTE — Telephone Encounter (Signed)
Pt tested positive on 12/31 and had a virtual with Wendling on 04/23/20. Please advise

## 2020-05-06 ENCOUNTER — Ambulatory Visit: Payer: PRIVATE HEALTH INSURANCE

## 2020-05-12 ENCOUNTER — Telehealth: Payer: PRIVATE HEALTH INSURANCE

## 2020-05-14 ENCOUNTER — Telehealth (INDEPENDENT_AMBULATORY_CARE_PROVIDER_SITE_OTHER): Payer: PRIVATE HEALTH INSURANCE | Admitting: Nurse Practitioner

## 2020-05-14 DIAGNOSIS — Z8616 Personal history of COVID-19: Secondary | ICD-10-CM | POA: Diagnosis not present

## 2020-05-14 DIAGNOSIS — R059 Cough, unspecified: Secondary | ICD-10-CM

## 2020-05-14 MED ORDER — FLUCONAZOLE 150 MG PO TABS
150.0000 mg | ORAL_TABLET | Freq: Once | ORAL | 0 refills | Status: AC
Start: 2020-05-14 — End: 2020-05-14

## 2020-05-14 MED ORDER — AZITHROMYCIN 250 MG PO TABS
ORAL_TABLET | ORAL | 0 refills | Status: DC
Start: 2020-05-14 — End: 2020-06-02

## 2020-05-14 NOTE — Patient Instructions (Addendum)
Covid 19 Cough:   Stay well hydrated  Stay active  Deep breathing exercises  May take tylenol or fever or pain  May take mucinex twice daily  Will order chest x ray:  [facility] [address], [place] [address] [phone] MON - FRI 8:00 AM - 4:00 PM - WALK IN  Will order azithromycin and diflucan (per pt request)  Follow up:  Follow up if needed

## 2020-05-14 NOTE — Progress Notes (Signed)
Virtual Visit via Telephone Note  I connected with Cheryl Patterson on 05/14/20 at  3:30 PM EST by telephone and verified that I am speaking with the correct person using two identifiers.  Location: Patient: home Provider: remote   I discussed the limitations, risks, security and privacy concerns of performing an evaluation and management service by telephone and the availability of in person appointments. I also discussed with the patient that there may be a patient responsible charge related to this service. The patient expressed understanding and agreed to proceed.  Chief Complaint  Patient presents with  . New Patient (Initial Visit)    +12/31, coughing up phlegm/mucous taking Mucinex with some relief.  Having multiple headaches, ear aches     History of Present Illness:  Patient presents today for post COVID care clinic visit through televisit.  Patient was diagnosed with Covid on 04/18/2020.  Patient continues to have cough which is productive, headaches, earaches.  Patient has completed a round of prednisone and does have albuterol inhaler which was prescribed by PCP.  Patient has not had any chest imaging since diagnosed with Covid.  She states that she does feel like she has chest congestion.  She denies any recent significant shortness of breath or fever. Denies f/c/s, n/v/d, hemoptysis, PND, chest pain or edema.        Observations/Objective:  Vitals with BMI 08/07/2019 07/26/2019 05/30/2019  Height 5\' 7"  5\' 7"  5\' 7"   Weight 210 lbs 212 lbs 3 oz 209 lbs  BMI 32.88 76.28 31.51  Systolic - 761 (No Data)  Diastolic - 78 (No Data)  Pulse - 89 -     Assessment and Plan:  Covid 19 Cough:   Stay well hydrated  Stay active  Deep breathing exercises  May take tylenol or fever or pain  May take mucinex twice daily  Will order chest x ray:  [facility] [address], [place] [address] [phone] MON - FRI 8:00 AM - 4:00 PM - WALK IN  Will order  azithromycin and diflucan (per pt request)     Follow Up Instructions:  Follow up if needed    I discussed the assessment and treatment plan with the patient. The patient was provided an opportunity to ask questions and all were answered. The patient agreed with the plan and demonstrated an understanding of the instructions.   The patient was advised to call back or seek an in-person evaluation if the symptoms worsen or if the condition fails to improve as anticipated.  I provided 22 minutes of non-face-to-face time during this encounter.   Fenton Foy, NP

## 2020-05-16 ENCOUNTER — Other Ambulatory Visit: Payer: Self-pay

## 2020-05-16 ENCOUNTER — Ambulatory Visit
Admission: RE | Admit: 2020-05-16 | Discharge: 2020-05-16 | Disposition: A | Discharge status: Home / Self Care | Payer: PRIVATE HEALTH INSURANCE | Source: Ambulatory Visit | Attending: Nurse Practitioner | Admitting: Nurse Practitioner

## 2020-05-20 NOTE — Progress Notes (Signed)
Patient notified of results, verbally understood. No additional questions.

## 2020-05-29 ENCOUNTER — Encounter: Payer: Self-pay | Admitting: Family Medicine

## 2020-05-29 ENCOUNTER — Other Ambulatory Visit: Payer: Self-pay | Admitting: Medical

## 2020-05-29 DIAGNOSIS — F988 Other specified behavioral and emotional disorders with onset usually occurring in childhood and adolescence: Secondary | ICD-10-CM

## 2020-05-30 ENCOUNTER — Other Ambulatory Visit: Payer: Self-pay

## 2020-05-30 DIAGNOSIS — F988 Other specified behavioral and emotional disorders with onset usually occurring in childhood and adolescence: Secondary | ICD-10-CM

## 2020-05-30 NOTE — Telephone Encounter (Signed)
She is actually overdue for ov

## 2020-05-30 NOTE — Telephone Encounter (Signed)
Requesting: Adderall  Contract: will completed on 06/02/20 UDS: will completed on 06/02/20 Last Visit: 01/10/2020 Next Visit: 06/02/2020 Last Refill:  03/21/2020  Please Advise

## 2020-06-02 ENCOUNTER — Ambulatory Visit (INDEPENDENT_AMBULATORY_CARE_PROVIDER_SITE_OTHER): Payer: PRIVATE HEALTH INSURANCE | Admitting: Family Medicine

## 2020-06-02 ENCOUNTER — Encounter: Payer: Self-pay | Admitting: Family Medicine

## 2020-06-02 ENCOUNTER — Other Ambulatory Visit: Payer: Self-pay

## 2020-06-02 VITALS — BP 108/84 | HR 92 | Temp 99.1°F | Resp 18 | Ht 67.0 in | Wt 216.8 lb

## 2020-06-02 DIAGNOSIS — F988 Other specified behavioral and emotional disorders with onset usually occurring in childhood and adolescence: Secondary | ICD-10-CM

## 2020-06-02 DIAGNOSIS — R232 Flushing: Secondary | ICD-10-CM | POA: Diagnosis not present

## 2020-06-02 DIAGNOSIS — Z79899 Other long term (current) drug therapy: Secondary | ICD-10-CM | POA: Diagnosis not present

## 2020-06-02 DIAGNOSIS — F320 Major depressive disorder, single episode, mild: Secondary | ICD-10-CM

## 2020-06-02 DIAGNOSIS — F3289 Other specified depressive episodes: Secondary | ICD-10-CM

## 2020-06-02 MED ORDER — FLUOXETINE HCL 20 MG PO TABS: 20.0000 mg | ORAL_TABLET | Freq: Every day | ORAL | 3 refills | Status: DC

## 2020-06-02 MED ORDER — AMPHETAMINE-DEXTROAMPHETAMINE 20 MG PO TABS: 20.0000 mg | ORAL_TABLET | Freq: Two times a day (BID) | ORAL | 0 refills | Status: DC

## 2020-06-02 NOTE — Progress Notes (Signed)
Patient ID: Cheryl Patterson, female    DOB: 1970/12/02  Age: 50 y.o. MRN: [id number]    Subjective:  Subjective  HPI Cheryl Patterson presents for f/u add.  She is doing well with the med but is c/o hot flashes from menopause.  The otc meds are not working  No other complaints  Review of Systems  Constitutional: Negative for appetite change, diaphoresis, fatigue and unexpected weight change.  Eyes: Negative for pain, redness and visual disturbance.  Respiratory: Negative for cough, chest tightness, shortness of breath and wheezing.   Cardiovascular: Negative for chest pain, palpitations and leg swelling.  Endocrine: Negative for cold intolerance, heat intolerance, polydipsia, polyphagia and polyuria.  Genitourinary: Negative for difficulty urinating, dysuria and frequency.  Neurological: Negative for dizziness, light-headedness, numbness and headaches.    History Past Medical History:  Diagnosis Date  . ADD (attention deficit disorder)   . Allergy   . Anxiety   . Asthma    per pt- bronchitis brings on the symptoms  . Bipolar depression ([place])   . Chronic headaches   . Chronic kidney disease    kidney stones  . Constipation   . Depression    [facility]---suicidal ideation  . Dyspnea   . GERD (gastroesophageal reflux disease)   . Hyperlipidemia    no meds taken as of 04-20-16  . IBS (irritable bowel syndrome)   . Joint pain   . Kidney infection   . Lactose intolerance   . Prediabetes   . Ruptured disc, thoracic   . Sepsis ([place])    2014-  d/t urinary infection  . UTI (urinary tract infection)   . Vitamin D deficiency     She has a past surgical history that includes Tubal ligation; LEEP (2005); Breast surgery; Other surgical history; Mouth surgery; Lipoma excision; Laparoscopic vaginal hysterectomy with salpingo oophorectomy (N/A, 03/18/2015); Cystoscopy (N/A, 03/18/2015); Laparoscopic bilateral salpingectomy (Bilateral, 03/18/2015); Colonoscopy; and Kidney stone  surgery.   Her family history includes Anxiety disorder in her mother; Brain cancer in her maternal aunt; Breast cancer in her mother and paternal aunt; Cancer in her maternal aunt, maternal uncle, and paternal aunt; Cancer (age of onset: 70) in her mother; Colon cancer in her maternal aunt and paternal uncle; Colon cancer (age of onset: 46) in her mother; Diabetes in her maternal grandfather; Heart disease in her paternal grandmother; Hyperlipidemia in her father; Hypertension in her father, paternal grandmother, and another family member; Stroke in her paternal grandmother; Uterine cancer in her maternal aunt.She reports that she has never smoked. She has never used smokeless tobacco. She reports that she does not drink alcohol and does not use drugs.  Current Outpatient Medications on File Prior to Visit  Medication Sig Dispense Refill  . albuterol (VENTOLIN HFA) 108 (90 Base) MCG/ACT inhaler Inhale 2 puffs into the lungs every 6 (six) hours as needed for wheezing or shortness of breath. 18 g 5  . loratadine (CLARITIN) 10 MG tablet Take 1 tablet (10 mg total) by mouth daily. 30 tablet 11  . Vitamin D, Ergocalciferol, (DRISDOL) 1.25 MG (50000 UNIT) CAPS capsule Take 1 capsule (50,000 Units total) by mouth every 7 (seven) days. 12 capsule 3   Current Facility-Administered Medications on File Prior to Visit  Medication Dose Route Frequency Provider Last Rate Last Admin  . 0.9 %  sodium chloride infusion  500 mL Intravenous Continuous Milus Banister, MD         Objective:  Objective  Physical Exam  Vitals and nursing note reviewed.  Constitutional:      Appearance: She is well-developed and well-nourished.  HENT:     Head: Normocephalic and atraumatic.  Eyes:     Extraocular Movements: EOM normal.     Conjunctiva/sclera: Conjunctivae normal.  Neck:     Thyroid: No thyromegaly.     Vascular: No carotid bruit or JVD.  Cardiovascular:     Rate and Rhythm: Normal rate and regular rhythm.      Heart sounds: Normal heart sounds. No murmur heard.   Pulmonary:     Effort: Pulmonary effort is normal. No respiratory distress.     Breath sounds: Normal breath sounds. No wheezing or rales.  Chest:     Chest wall: No tenderness.  Musculoskeletal:        General: No edema.     Cervical back: Normal range of motion and neck supple.  Neurological:     Mental Status: She is alert and oriented to person, place, and time.  Psychiatric:        Mood and Affect: Mood and affect and mood normal.        Thought Content: Thought content normal.    BP 108/84 (BP Location: Right Arm, Patient Position: Sitting, Cuff Size: Large)   Pulse 92   Temp 99.1 F (37.3 C) (Oral)   Resp 18   Ht 5\' 7"  (1.702 m)   Wt 216 lb 12.8 oz (98.3 kg)   LMP 12/02/2014   SpO2 97%   BMI 33.96 kg/m  Wt Readings from Last 3 Encounters:  06/02/20 216 lb 12.8 oz (98.3 kg)  08/07/19 210 lb (95.3 kg)  07/26/19 212 lb 3.2 oz (96.3 kg)     Lab Results  Component Value Date   WBC 5.4 04/30/2019   HGB 13.3 04/30/2019   HCT 40.6 04/30/2019   PLT 289.0 04/30/2019   GLUCOSE 96 04/30/2019   CHOL 223 (H) 04/30/2019   TRIG 84.0 04/30/2019   HDL 43.40 04/30/2019   LDLDIRECT 144.3 06/25/2010   LDLCALC 163 (H) 04/30/2019   ALT 10 04/30/2019   AST 13 04/30/2019   NA 138 04/30/2019   K 4.3 04/30/2019   CL 102 04/30/2019   CREATININE 0.83 04/30/2019   BUN 8 04/30/2019   CO2 29 04/30/2019   TSH 1.40 04/30/2019   HGBA1C 5.9 04/30/2019    DG Chest 2 View  Result Date: 05/16/2020 CLINICAL DATA:  History of prior COVID-19 infection with persistent cough and shortness of breath for 1 month EXAM: CHEST - 2 VIEW COMPARISON:  10/24/2017 FINDINGS: The heart size and mediastinal contours are within normal limits. Both lungs are clear. The visualized skeletal structures are unremarkable. IMPRESSION: No active cardiopulmonary disease. Electronically Signed   By: Inez Catalina M.D.   On: 05/16/2020 19:42     Assessment &  Plan:  Plan  I have discontinued Cheryl Patterson's azithromycin. I am also having her start on amphetamine-dextroamphetamine and amphetamine-dextroamphetamine. Additionally, I am having her maintain her loratadine, Vitamin D (Ergocalciferol), albuterol, amphetamine-dextroamphetamine, and FLUoxetine. We will continue to administer sodium chloride.  Meds ordered this encounter  Medications  . amphetamine-dextroamphetamine (ADDERALL) 20 MG tablet    Sig: Take 1 tablet (20 mg total) by mouth 2 (two) times daily.    Dispense:  60 tablet    Refill:  0    Do not fill until April 2022  . amphetamine-dextroamphetamine (ADDERALL) 20 MG tablet    Sig: Take 1 tablet (20 mg total) by  mouth 2 (two) times daily.    Dispense:  60 tablet    Refill:  0    Do not fill until March 2022  . amphetamine-dextroamphetamine (ADDERALL) 20 MG tablet    Sig: Take 1 tablet (20 mg total) by mouth 2 (two) times daily.    Dispense:  60 tablet    Refill:  0  . FLUoxetine (PROZAC) 20 MG tablet    Sig: Take 1 tablet (20 mg total) by mouth daily.    Dispense:  90 tablet    Refill:  3    Problem List Items Addressed This Visit      Unprioritized   Attention deficit disorder - Primary    Controlled with med Refill adderall uds and contract utd  Database reviewed       Relevant Medications   amphetamine-dextroamphetamine (ADDERALL) 20 MG tablet   amphetamine-dextroamphetamine (ADDERALL) 20 MG tablet   amphetamine-dextroamphetamine (ADDERALL) 20 MG tablet   Other Relevant Orders   DRUG MONITORING, PANEL 8 WITH CONFIRMATION, URINE   Depression    Refill prozac Pt has not been taking it regularly This may help with hot flashes as well       Relevant Medications   FLUoxetine (PROZAC) 20 MG tablet   Depression, major, single episode, mild (HCC)   Relevant Medications   FLUoxetine (PROZAC) 20 MG tablet   Hot flashes    prozac may help  Will f/u in 6 months or sooner prn        Other Visit Diagnoses     High risk medication use       Relevant Orders   DRUG MONITORING, PANEL 8 WITH CONFIRMATION, URINE      Follow-up: Return in about 6 months (around 11/30/2020), or if symptoms worsen or fail to improve, for fasting, annual exam.  Ann Held, DO

## 2020-06-02 NOTE — Patient Instructions (Signed)

## 2020-06-02 NOTE — Assessment & Plan Note (Signed)
Controlled with med Refill adderall uds and contract utd  Database reviewed

## 2020-06-02 NOTE — Assessment & Plan Note (Signed)
Refill prozac Pt has not been taking it regularly This may help with hot flashes as well

## 2020-06-02 NOTE — Assessment & Plan Note (Signed)
prozac may help  Will f/u in 6 months or sooner prn

## 2020-06-05 LAB — DM TEMPLATE

## 2020-06-05 LAB — DRUG MONITORING, PANEL 8 WITH CONFIRMATION, URINE
6 Acetylmorphine: NEGATIVE ng/mL (ref <10)
Alcohol Metabolites: NEGATIVE ng/mL
Amphetamine: 1563 ng/mL — ABNORMAL HIGH (ref <250)
Amphetamines: POSITIVE ng/mL — AB (ref <500)
Benzodiazepines: NEGATIVE ng/mL (ref <100)
Buprenorphine, Urine: NEGATIVE ng/mL (ref <5)
Cocaine Metabolite: NEGATIVE ng/mL (ref <150)
Creatinine: 164.6 mg/dL
MDMA: NEGATIVE ng/mL (ref <500)
Marijuana Metabolite: NEGATIVE ng/mL (ref <20)
Methamphetamine: NEGATIVE ng/mL (ref <250)
Opiates: NEGATIVE ng/mL (ref <100)
Oxidant: NEGATIVE ug/mL
Oxycodone: NEGATIVE ng/mL (ref <100)
pH: 7.2 (ref 4.5–9.0)

## 2020-07-29 ENCOUNTER — Encounter: Payer: Self-pay | Admitting: Family Medicine

## 2020-07-30 ENCOUNTER — Other Ambulatory Visit: Payer: Self-pay | Admitting: Family Medicine

## 2020-07-30 ENCOUNTER — Telehealth: Payer: Self-pay

## 2020-07-30 DIAGNOSIS — F988 Other specified behavioral and emotional disorders with onset usually occurring in childhood and adolescence: Secondary | ICD-10-CM

## 2020-07-30 MED ORDER — AMPHETAMINE-DEXTROAMPHETAMINE 20 MG PO TABS: 20.0000 mg | ORAL_TABLET | Freq: Two times a day (BID) | ORAL | 0 refills | Status: DC

## 2020-07-30 NOTE — Telephone Encounter (Signed)
done

## 2020-07-30 NOTE — Telephone Encounter (Signed)
Requesting: Adderall 20 mg Contract: 06/02/2020 UDS: 06/02/2020 Last Visit: 06/02/2020 Next Visit: 12/02/2020 Last Refill: 06/02/2020

## 2020-09-21 ENCOUNTER — Encounter: Payer: Self-pay | Admitting: Family Medicine

## 2020-09-21 DIAGNOSIS — F988 Other specified behavioral and emotional disorders with onset usually occurring in childhood and adolescence: Secondary | ICD-10-CM

## 2020-09-22 MED ORDER — AMPHETAMINE-DEXTROAMPHETAMINE 20 MG PO TABS: 20.0000 mg | ORAL_TABLET | Freq: Two times a day (BID) | ORAL | 0 refills | Status: DC

## 2020-09-22 NOTE — Telephone Encounter (Signed)
Requesting: Adderall  Contract: 06/02/2020 UDS: 06/02/2020 Last OV: 06/02/20 Next OV: 12/02/2020 Last Refill: 07/30/2020, #60--0 RF Database:   Please advise

## 2020-09-25 ENCOUNTER — Other Ambulatory Visit: Payer: Self-pay | Admitting: Family Medicine

## 2020-09-25 DIAGNOSIS — F988 Other specified behavioral and emotional disorders with onset usually occurring in childhood and adolescence: Secondary | ICD-10-CM

## 2020-10-23 ENCOUNTER — Other Ambulatory Visit: Payer: Self-pay | Admitting: Family Medicine

## 2020-10-23 DIAGNOSIS — Z1231 Encounter for screening mammogram for malignant neoplasm of breast: Secondary | ICD-10-CM

## 2020-10-27 ENCOUNTER — Other Ambulatory Visit: Payer: Self-pay

## 2020-10-27 ENCOUNTER — Ambulatory Visit
Admission: RE | Admit: 2020-10-27 | Discharge: 2020-10-27 | Disposition: A | Discharge status: Home / Self Care | Payer: PRIVATE HEALTH INSURANCE | Source: Ambulatory Visit

## 2020-10-27 DIAGNOSIS — Z1231 Encounter for screening mammogram for malignant neoplasm of breast: Secondary | ICD-10-CM

## 2020-10-31 ENCOUNTER — Encounter: Payer: Self-pay | Admitting: Family Medicine

## 2020-12-01 ENCOUNTER — Other Ambulatory Visit: Payer: Self-pay

## 2020-12-02 ENCOUNTER — Encounter: Payer: Self-pay | Admitting: Family Medicine

## 2020-12-02 ENCOUNTER — Ambulatory Visit (INDEPENDENT_AMBULATORY_CARE_PROVIDER_SITE_OTHER): Payer: PRIVATE HEALTH INSURANCE | Admitting: Family Medicine

## 2020-12-02 VITALS — BP 118/80 | HR 81 | Temp 98.6°F | Resp 18 | Ht 67.0 in | Wt 221.7 lb

## 2020-12-02 DIAGNOSIS — R079 Chest pain, unspecified: Secondary | ICD-10-CM | POA: Diagnosis not present

## 2020-12-02 DIAGNOSIS — Z6833 Body mass index (BMI) 33.0-33.9, adult: Secondary | ICD-10-CM

## 2020-12-02 DIAGNOSIS — F988 Other specified behavioral and emotional disorders with onset usually occurring in childhood and adolescence: Secondary | ICD-10-CM

## 2020-12-02 DIAGNOSIS — E669 Obesity, unspecified: Secondary | ICD-10-CM

## 2020-12-02 DIAGNOSIS — Z0000 Encounter for general adult medical examination without abnormal findings: Secondary | ICD-10-CM

## 2020-12-02 DIAGNOSIS — R109 Unspecified abdominal pain: Secondary | ICD-10-CM

## 2020-12-02 DIAGNOSIS — E782 Mixed hyperlipidemia: Secondary | ICD-10-CM

## 2020-12-02 DIAGNOSIS — J4520 Mild intermittent asthma, uncomplicated: Secondary | ICD-10-CM

## 2020-12-02 LAB — CBC WITH DIFFERENTIAL/PLATELET
Basophils Absolute: 0 10*3/uL (ref 0.0–0.1)
Basophils Relative: 0.8 % (ref 0.0–3.0)
Eosinophils Absolute: 0.1 10*3/uL (ref 0.0–0.7)
Eosinophils Relative: 1.7 % (ref 0.0–5.0)
HCT: 39.6 % (ref 36.0–46.0)
Hemoglobin: 13.1 g/dL (ref 12.0–15.0)
Lymphocytes Relative: 32.6 % (ref 12.0–46.0)
Lymphs Abs: 1.8 10*3/uL (ref 0.7–4.0)
MCHC: 33.2 g/dL (ref 30.0–36.0)
MCV: 87 fl (ref 78.0–100.0)
Monocytes Absolute: 0.3 10*3/uL (ref 0.1–1.0)
Monocytes Relative: 5 % (ref 3.0–12.0)
Neutro Abs: 3.4 10*3/uL (ref 1.4–7.7)
Neutrophils Relative %: 59.9 % (ref 43.0–77.0)
Platelets: 286 10*3/uL (ref 150.0–400.0)
RBC: 4.55 Mil/uL (ref 3.87–5.11)
RDW: 13.1 % (ref 11.5–15.5)
WBC: 5.6 10*3/uL (ref 4.0–10.5)

## 2020-12-02 LAB — LIPID PANEL
Cholesterol: 222 mg/dL — ABNORMAL HIGH (ref 0–200)
HDL: 44.8 mg/dL (ref >39.00)
LDL Cholesterol: 162 mg/dL — ABNORMAL HIGH (ref 0–99)
NonHDL: 177.29
Total CHOL/HDL Ratio: 5
Triglycerides: 74 mg/dL (ref 0.0–149.0)
VLDL: 14.8 mg/dL (ref 0.0–40.0)

## 2020-12-02 LAB — COMPREHENSIVE METABOLIC PANEL
ALT: 14 U/L (ref 0–35)
AST: 18 U/L (ref 0–37)
Albumin: 4.1 g/dL (ref 3.5–5.2)
Alkaline Phosphatase: 89 U/L (ref 39–117)
BUN: 10 mg/dL (ref 6–23)
CO2: 29 mEq/L (ref 19–32)
Calcium: 9.4 mg/dL (ref 8.4–10.5)
Chloride: 99 mEq/L (ref 96–112)
Creatinine, Ser: 0.87 mg/dL (ref 0.40–1.20)
GFR: 77.58 mL/min (ref >60.00)
Glucose, Bld: 87 mg/dL (ref 70–99)
Potassium: 4.5 mEq/L (ref 3.5–5.1)
Sodium: 135 mEq/L (ref 135–145)
Total Bilirubin: 0.5 mg/dL (ref 0.2–1.2)
Total Protein: 7.1 g/dL (ref 6.0–8.3)

## 2020-12-02 LAB — H. PYLORI ANTIBODY, IGG: H Pylori IgG: NEGATIVE

## 2020-12-02 LAB — TSH: TSH: 1.39 u[IU]/mL (ref 0.35–5.50)

## 2020-12-02 MED ORDER — AMPHETAMINE-DEXTROAMPHETAMINE 20 MG PO TABS: 20.0000 mg | ORAL_TABLET | Freq: Two times a day (BID) | ORAL | 0 refills | Status: DC

## 2020-12-02 MED ORDER — PANTOPRAZOLE SODIUM 40 MG PO TBEC: 40.0000 mg | DELAYED_RELEASE_TABLET | Freq: Every day | ORAL | 3 refills | Status: DC

## 2020-12-02 NOTE — Assessment & Plan Note (Signed)
Encourage heart healthy diet such as MIND or DASH diet, increase exercise, avoid trans fats, simple carbohydrates and processed foods, consider a krill or fish or flaxseed oil cap daily.  °

## 2020-12-02 NOTE — Patient Instructions (Signed)
Preventive Care 68-50 Years Old, Female Preventive care refers to lifestyle choices and visits with your health care provider that can promote health and wellness. This includes: A yearly physical exam. This is also called an annual wellness visit. Regular dental and eye exams. Immunizations. Screening for certain conditions. Healthy lifestyle choices, such as: Eating a healthy diet. Getting regular exercise. Not using drugs or products that contain nicotine and tobacco. Limiting alcohol use. What can I expect for my preventive care visit? Physical exam Your health care provider will check your: Height and weight. These may be used to calculate your BMI (body mass index). BMI is a measurement that tells if you are at a healthy weight. Heart rate and blood pressure. Body temperature. Skin for abnormal spots. Counseling Your health care provider may ask you questions about your: Past medical problems. Family's medical history. Alcohol, tobacco, and drug use. Emotional well-being. Home life and relationship well-being. Sexual activity. Diet, exercise, and sleep habits. Work and work Statistician. Access to firearms. Method of birth control. Menstrual cycle. Pregnancy history. What immunizations do I need?  Vaccines are usually given at various ages, according to a schedule. Your health care provider will recommend vaccines for you based on your age, medicalhistory, and lifestyle or other factors, such as travel or where you work. What tests do I need? Blood tests Lipid and cholesterol levels. These may be checked every 5 years, or more often if you are over 37 years old. Hepatitis C test. Hepatitis B test. Screening Lung cancer screening. You may have this screening every year starting at age 50 if you have a 30-pack-year history of smoking and currently smoke or have quit within the past 15 years. Colorectal cancer screening. All adults should have this screening starting at  age 23 and continuing until age 50. Your health care provider may recommend screening at age 50 if you are at increased risk. You will have tests every 1-10 years, depending on your results and the type of screening test. Diabetes screening. This is done by checking your blood sugar (glucose) after you have not eaten for a while (fasting). You may have this done every 1-3 years. Mammogram. This may be done every 1-2 years. Talk with your health care provider about when you should start having regular mammograms. This may depend on whether you have a family history of breast cancer. BRCA-related cancer screening. This may be done if you have a family history of breast, ovarian, tubal, or peritoneal cancers. Pelvic exam and Pap test. This may be done every 3 years starting at age 50. Starting at age 50, this may be done every 5 years if you have a Pap test in combination with an HPV test. Other tests STD (sexually transmitted disease) testing, if you are at risk. Bone density scan. This is done to screen for osteoporosis. You may have this scan if you are at high risk for osteoporosis. Talk with your health care provider about your test results, treatment options,and if necessary, the need for more tests. Follow these instructions at home: Eating and drinking  Eat a diet that includes fresh fruits and vegetables, whole grains, lean protein, and low-fat dairy products. Take vitamin and mineral supplements as recommended by your health care provider. Do not drink alcohol if: Your health care provider tells you not to drink. You are pregnant, may be pregnant, or are planning to become pregnant. If you drink alcohol: Limit how much you have to 0-1 drink a day. Be aware  of how much alcohol is in your drink. In the [place] one drink equals one 12 oz bottle of beer (355 mL), one 5 oz glass of wine (148 mL), or one 1 oz glass of hard liquor (44 mL).  Lifestyle Take daily care of your teeth and  gums. Brush your teeth every morning and night with fluoride toothpaste. Floss one time each day. Stay active. Exercise for at least 30 minutes 5 or more days each week. Do not use any products that contain nicotine or tobacco, such as cigarettes, e-cigarettes, and chewing tobacco. If you need help quitting, ask your health care provider. Do not use drugs. If you are sexually active, practice safe sex. Use a condom or other form of protection to prevent STIs (sexually transmitted infections). If you do not wish to become pregnant, use a form of birth control. If you plan to become pregnant, see your health care provider for a prepregnancy visit. If told by your health care provider, take low-dose aspirin daily starting at age 50. Find healthy ways to cope with stress, such as: Meditation, yoga, or listening to music. Journaling. Talking to a trusted person. Spending time with friends and family. Safety Always wear your seat belt while driving or riding in a vehicle. Do not drive: If you have been drinking alcohol. Do not ride with someone who has been drinking. When you are tired or distracted. While texting. Wear a helmet and other protective equipment during sports activities. If you have firearms in your house, make sure you follow all gun safety procedures. What's next? Visit your health care provider once a year for an annual wellness visit. Ask your health care provider how often you should have your eyes and teeth checked. Stay up to date on all vaccines. This information is not intended to replace advice given to you by your health care provider. Make sure you discuss any questions you have with your healthcare provider. Document Revised: 01/08/2020 Document Reviewed: 12/15/2017 Elsevier Patient Education  2022 [employer].

## 2020-12-02 NOTE — Assessment & Plan Note (Signed)
ghm utd Check labs  See avs  

## 2020-12-02 NOTE — Assessment & Plan Note (Signed)
protonix daily Check labs

## 2020-12-02 NOTE — Assessment & Plan Note (Signed)
Refer back to healthy weight and wellness

## 2020-12-02 NOTE — Assessment & Plan Note (Signed)
stable °

## 2020-12-02 NOTE — Assessment & Plan Note (Signed)
Stable con't meds 

## 2020-12-02 NOTE — Progress Notes (Signed)
Subjective:   By signing my name below, I, Shehryar Baig, attest that this documentation has been prepared under the direction and in the presence of Dr. Roma Schanz, DO. 12/02/2020    Patient ID: Cheryl Patterson, female    DOB: 12/17/1970, 50 y.o.   MRN: [id number]  Chief Complaint  Patient presents with   Annual Exam    Pt states fasting.     HPI Patient is in today for a comprehensive physical exam.  She is requesting a refill on 20 mg adderall 2x daily PO.  She reports having occasional chest pain and tightness. She describes the pain as feeling like muscle pain. She notes that recently it has increased in frequency. She takes albuterol inhaler to manage her symptoms and finds mild relief.  She reports her seasonal allergies are starting up. She notes developing a cough and allergies  She reports not seeing a GYN specialist at this time. She denies having any fever, ear pain, congestion, sinus pain, sore throat, eye pain, palpations, cough, SOB, wheezing, n/v/d, constipation, blood in stool, dysuria, frequency, hematuria, or headaches at this time. She reports no recent changes in her family medical history.  She is not participating in regular exercise at this time. She is planning on starting regular exercise next week. She has tried a healthy weight and wellness program in the past but stopped due to the prescribed medication giving her negative side effects.  She is eligible for the shingles vaccine at this time and is willing to get it at Friday after her work is completed. She reports having 2 Covid-19 vaccines at this time.  She is due for vision care. She is due for dental care and is planning to switch her current dentist before making a new appointment.    Past Medical History:  Diagnosis Date   ADD (attention deficit disorder)    Allergy    Anxiety    Asthma    per pt- bronchitis brings on the symptoms   Bipolar depression ([place])    Chronic headaches     Chronic kidney disease    kidney stones   Constipation    Depression    [facility]---suicidal ideation   Dyspnea    GERD (gastroesophageal reflux disease)    Hyperlipidemia    no meds taken as of 04-20-16   IBS (irritable bowel syndrome)    Joint pain    Kidney infection    Lactose intolerance    Prediabetes    Ruptured disc, thoracic    Sepsis ([place])    2014-  d/t urinary infection   UTI (urinary tract infection)    Vitamin D deficiency     Past Surgical History:  Procedure Laterality Date   BREAST SURGERY     left breast lump removed   COLONOSCOPY     CYSTOSCOPY N/A 03/18/2015   Procedure: CYSTOSCOPY;  Surgeon: Terrance Mass, MD;  Location: [facility];  Service: Gynecology;  Laterality: N/A;   KIDNEY STONE SURGERY     LAPAROSCOPIC BILATERAL SALPINGECTOMY Bilateral 03/18/2015   Procedure: LAPAROSCOPIC BILATERAL SALPINGECTOMY;  Surgeon: Terrance Mass, MD;  Location: [facility];  Service: Gynecology;  Laterality: Bilateral;   LAPAROSCOPIC VAGINAL HYSTERECTOMY WITH SALPINGO OOPHORECTOMY N/A 03/18/2015   Procedure: LAPAROSCOPIC ASSISTED VAGINAL HYSTERECTOMY WITH SALPINGO OOPHORECTOMY;  Surgeon: Terrance Mass, MD;  Location: [facility];  Service: Gynecology;  Laterality: N/A;   LEEP  2005   LIPOMA EXCISION     MOUTH SURGERY  OTHER SURGICAL HISTORY     RENAL STONE REMOVAL   TUBAL LIGATION      Family History  Problem Relation Age of Onset   Colon cancer Maternal Aunt    Cancer Maternal Aunt        colon   Breast cancer Mother    Colon cancer Mother 58   Cancer Mother 35       colon   Anxiety disorder Mother    Hypertension Father    Hyperlipidemia Father    Hypertension Other    Stroke Paternal Grandmother    Hypertension Paternal Grandmother    Heart disease Paternal Grandmother    Breast cancer Paternal Aunt    Cancer Paternal Aunt        breast   Uterine cancer Maternal Aunt    Brain cancer Maternal Aunt    Diabetes Maternal Grandfather    Cancer  Maternal Uncle        COLON   Colon cancer Paternal Uncle    Esophageal cancer Neg Hx    Rectal cancer Neg Hx    Stomach cancer Neg Hx     Social History   Socioeconomic History   Marital status: Married    Spouse name: Tim   Number of children: 2   Years of education: Not on file   Highest education level: Not on file  Occupational History   Occupation: Lobbyist: [employer]  Tobacco Use   Smoking status: Never   Smokeless tobacco: Never  Substance and Sexual Activity   Alcohol use: No    Alcohol/week: 0.0 standard drinks   Drug use: No   Sexual activity: Yes    Partners: Male    Birth control/protection: None  Other Topics Concern   Not on file  Social History Narrative   Exercise--walk qd                    Video--  rare   Social Determinants of Health   Financial Resource Strain: Not on file  Food Insecurity: Not on file  Transportation Needs: Not on file  Physical Activity: Not on file  Stress: Not on file  Social Connections: Not on file  Intimate Partner Violence: Not on file    Outpatient Medications Prior to Visit  Medication Sig Dispense Refill   albuterol (VENTOLIN HFA) 108 (90 Base) MCG/ACT inhaler Inhale 2 puffs into the lungs every 6 (six) hours as needed for wheezing or shortness of breath. 18 g 5   FLUoxetine (PROZAC) 20 MG tablet Take 1 tablet (20 mg total) by mouth daily. 90 tablet 3   loratadine (CLARITIN) 10 MG tablet Take 1 tablet (10 mg total) by mouth daily. 30 tablet 11   Vitamin D, Ergocalciferol, (DRISDOL) 1.25 MG (50000 UNIT) CAPS capsule Take 1 capsule (50,000 Units total) by mouth every 7 (seven) days. 12 capsule 3   amphetamine-dextroamphetamine (ADDERALL) 20 MG tablet Take 1 tablet (20 mg total) by mouth 2 (two) times daily. 60 tablet 0   amphetamine-dextroamphetamine (ADDERALL) 20 MG tablet Take 1 tablet (20 mg total) by mouth 2 (two) times daily. 60 tablet 0   amphetamine-dextroamphetamine  (ADDERALL) 20 MG tablet Take 1 tablet (20 mg total) by mouth 2 (two) times daily. 60 tablet 0   Facility-Administered Medications Prior to Visit  Medication Dose Route Frequency Provider Last Rate Last Admin   0.9 %  sodium chloride infusion  500 mL Intravenous Continuous Milus Banister, MD  Allergies  Allergen Reactions   Codeine     Itchy throat, whelps on skin   Other Swelling and Rash    tomatoes    Review of Systems  Constitutional:  Negative for fever.  HENT:  Negative for congestion, ear pain, sinus pain and sore throat.   Eyes:  Negative for pain.  Respiratory:  Negative for cough, shortness of breath and wheezing.   Cardiovascular:  Positive for chest pain. Negative for palpitations.       (+)Chest tightess  Gastrointestinal:  Positive for heartburn. Negative for blood in stool, constipation, diarrhea, nausea and vomiting.  Genitourinary:  Negative for dysuria, frequency and hematuria.  Neurological:  Negative for headaches.  Psychiatric/Behavioral:  Negative for depression. The patient is not nervous/anxious.       Objective:    Physical Exam Constitutional:      General: She is not in acute distress.    Appearance: Normal appearance. She is not ill-appearing.  HENT:     Head: Normocephalic and atraumatic.     Right Ear: Tympanic membrane, ear canal and external ear normal.     Left Ear: Tympanic membrane, ear canal and external ear normal.  Eyes:     Extraocular Movements: Extraocular movements intact.     Pupils: Pupils are equal, round, and reactive to light.  Cardiovascular:     Rate and Rhythm: Normal rate and regular rhythm.     Heart sounds: Normal heart sounds. No murmur heard.   No gallop.  Pulmonary:     Effort: Pulmonary effort is normal. No respiratory distress.     Breath sounds: Normal breath sounds. No wheezing or rales.  Abdominal:     General: Bowel sounds are normal. There is no distension.     Palpations: Abdomen is soft. There is  no mass.     Tenderness: There is no abdominal tenderness. There is no guarding or rebound.  Skin:    General: Skin is warm and dry.  Neurological:     Mental Status: She is alert and oriented to person, place, and time.  Psychiatric:        Behavior: Behavior normal.        Judgment: Judgment normal.    BP 118/80 (BP Location: Left Arm, Patient Position: Sitting, Cuff Size: Large)   Pulse 81   Temp 98.6 F (37 C) (Oral)   Resp 18   Ht '5\' 7"'$  (1.702 m)   Wt 221 lb 11.2 oz (100.6 kg)   LMP 12/02/2014   SpO2 98%   BMI 34.72 kg/m  Wt Readings from Last 3 Encounters:  12/02/20 221 lb 11.2 oz (100.6 kg)  06/02/20 216 lb 12.8 oz (98.3 kg)  08/07/19 210 lb (95.3 kg)    Diabetic Foot Exam - Simple   No data filed    Lab Results  Component Value Date   WBC 5.4 04/30/2019   HGB 13.3 04/30/2019   HCT 40.6 04/30/2019   PLT 289.0 04/30/2019   GLUCOSE 96 04/30/2019   CHOL 223 (H) 04/30/2019   TRIG 84.0 04/30/2019   HDL 43.40 04/30/2019   LDLDIRECT 144.3 06/25/2010   LDLCALC 163 (H) 04/30/2019   ALT 10 04/30/2019   AST 13 04/30/2019   NA 138 04/30/2019   K 4.3 04/30/2019   CL 102 04/30/2019   CREATININE 0.83 04/30/2019   BUN 8 04/30/2019   CO2 29 04/30/2019   TSH 1.40 04/30/2019   HGBA1C 5.9 04/30/2019    Lab Results  Component Value  Date   TSH 1.40 04/30/2019   Lab Results  Component Value Date   WBC 5.4 04/30/2019   HGB 13.3 04/30/2019   HCT 40.6 04/30/2019   MCV 88.1 04/30/2019   PLT 289.0 04/30/2019   Lab Results  Component Value Date   NA 138 04/30/2019   K 4.3 04/30/2019   CO2 29 04/30/2019   GLUCOSE 96 04/30/2019   BUN 8 04/30/2019   CREATININE 0.83 04/30/2019   BILITOT 0.4 04/30/2019   ALKPHOS 83 04/30/2019   AST 13 04/30/2019   ALT 10 04/30/2019   PROT 7.2 04/30/2019   ALBUMIN 4.1 04/30/2019   CALCIUM 9.3 04/30/2019   GFR 88.40 04/30/2019   Lab Results  Component Value Date   CHOL 223 (H) 04/30/2019   Lab Results  Component Value Date    HDL 43.40 04/30/2019   Lab Results  Component Value Date   LDLCALC 163 (H) 04/30/2019   Lab Results  Component Value Date   TRIG 84.0 04/30/2019   Lab Results  Component Value Date   CHOLHDL 5 04/30/2019   Lab Results  Component Value Date   HGBA1C 5.9 04/30/2019   Mammogram- Last completed 10/30/2020. Results normal. Repeat in 1 year.  Colonoscopy- Last completed 04/27/2016. Result are normal. Repeat in 5 years.      Assessment & Plan:   Problem List Items Addressed This Visit       Unprioritized   Attention deficit disorder    Stable  con't meds       Relevant Medications   amphetamine-dextroamphetamine (ADDERALL) 20 MG tablet   amphetamine-dextroamphetamine (ADDERALL) 20 MG tablet   amphetamine-dextroamphetamine (ADDERALL) 20 MG tablet   Chest pain    protonix daily Check labs        Relevant Medications   pantoprazole (PROTONIX) 40 MG tablet   Other Relevant Orders   EKG 12-Lead   Class 1 obesity with serious comorbidity and body mass index (BMI) of 33.0 to 33.9 in adult    Refer back to healthy weight and wellness      Relevant Medications   amphetamine-dextroamphetamine (ADDERALL) 20 MG tablet   amphetamine-dextroamphetamine (ADDERALL) 20 MG tablet   amphetamine-dextroamphetamine (ADDERALL) 20 MG tablet   Mild intermittent asthma    stable      Mixed hyperlipidemia    Encourage heart healthy diet such as MIND or DASH diet, increase exercise, avoid trans fats, simple carbohydrates and processed foods, consider a krill or fish or flaxseed oil cap daily.       Preventative health care - Primary    ghm utd Check labs  See avs      Relevant Orders   CBC with Differential/Platelet   Lipid panel   TSH   Comprehensive metabolic panel   Other Visit Diagnoses     Abdominal pain, unspecified abdominal location       Relevant Medications   pantoprazole (PROTONIX) 40 MG tablet   Other Relevant Orders   H. pylori antibody, IgG   Morbid  obesity (HCC)       Relevant Medications   amphetamine-dextroamphetamine (ADDERALL) 20 MG tablet   amphetamine-dextroamphetamine (ADDERALL) 20 MG tablet   amphetamine-dextroamphetamine (ADDERALL) 20 MG tablet   Other Relevant Orders   Amb Ref to Medical Weight Management        Meds ordered this encounter  Medications   amphetamine-dextroamphetamine (ADDERALL) 20 MG tablet    Sig: Take 1 tablet (20 mg total) by mouth 2 (two) times daily.  Dispense:  60 tablet    Refill:  0    Do not fill until Nov 2022   amphetamine-dextroamphetamine (ADDERALL) 20 MG tablet    Sig: Take 1 tablet (20 mg total) by mouth 2 (two) times daily.    Dispense:  60 tablet    Refill:  0    Do not fill until oct 2022   amphetamine-dextroamphetamine (ADDERALL) 20 MG tablet    Sig: Take 1 tablet (20 mg total) by mouth 2 (two) times daily.    Dispense:  60 tablet    Refill:  0    Do not fill until sept 2022   pantoprazole (PROTONIX) 40 MG tablet    Sig: Take 1 tablet (40 mg total) by mouth daily.    Dispense:  30 tablet    Refill:  3    I, Dr. Roma Schanz, DO, personally preformed the services described in this documentation.  All medical record entries made by the scribe were at my direction and in my presence.  I have reviewed the chart and discharge instructions (if applicable) and agree that the record reflects my personal performance and is accurate and complete. 12/02/2020   I,Shehryar Baig,acting as a scribe for Ann Held, DO.,have documented all relevant documentation on the behalf of Ann Held, DO,as directed by  Ann Held, DO while in the presence of Ann Held, DO.   Ann Held, DO

## 2020-12-04 ENCOUNTER — Other Ambulatory Visit: Payer: Self-pay

## 2020-12-04 MED ORDER — ROSUVASTATIN CALCIUM 10 MG PO TABS: 10.0000 mg | ORAL_TABLET | Freq: Every day | ORAL | 2 refills | Status: DC

## 2020-12-15 ENCOUNTER — Encounter: Payer: Self-pay | Admitting: Family Medicine

## 2020-12-16 NOTE — Telephone Encounter (Signed)
Pt called this morning. Left VM to schedule a virtual.

## 2021-02-10 ENCOUNTER — Emergency Department (HOSPITAL_BASED_OUTPATIENT_CLINIC_OR_DEPARTMENT_OTHER): Payer: 59 | Admitting: Radiology

## 2021-02-10 ENCOUNTER — Other Ambulatory Visit: Payer: Self-pay

## 2021-02-10 ENCOUNTER — Encounter (HOSPITAL_BASED_OUTPATIENT_CLINIC_OR_DEPARTMENT_OTHER): Payer: Self-pay

## 2021-02-10 ENCOUNTER — Emergency Department (HOSPITAL_BASED_OUTPATIENT_CLINIC_OR_DEPARTMENT_OTHER)
Admission: EM | Admit: 2021-02-10 | Discharge: 2021-02-10 | Discharge status: Against Medical Advice | Payer: 59 | Attending: Student | Admitting: Student

## 2021-02-10 DIAGNOSIS — R42 Dizziness and giddiness: Secondary | ICD-10-CM | POA: Insufficient documentation

## 2021-02-10 DIAGNOSIS — R0789 Other chest pain: Secondary | ICD-10-CM | POA: Insufficient documentation

## 2021-02-10 DIAGNOSIS — Z5321 Procedure and treatment not carried out due to patient leaving prior to being seen by health care provider: Secondary | ICD-10-CM | POA: Insufficient documentation

## 2021-02-10 DIAGNOSIS — Y990 Civilian activity done for income or pay: Secondary | ICD-10-CM | POA: Diagnosis not present

## 2021-02-10 DIAGNOSIS — R110 Nausea: Secondary | ICD-10-CM | POA: Insufficient documentation

## 2021-02-10 LAB — CBC WITH DIFFERENTIAL/PLATELET
Abs Immature Granulocytes: 0.01 10*3/uL (ref 0.00–0.07)
Basophils Absolute: 0 10*3/uL (ref 0.0–0.1)
Basophils Relative: 0 %
Eosinophils Absolute: 0.1 10*3/uL (ref 0.0–0.5)
Eosinophils Relative: 2 %
HCT: 38 % (ref 36.0–46.0)
Hemoglobin: 12.8 g/dL (ref 12.0–15.0)
Immature Granulocytes: 0 %
Lymphocytes Relative: 28 %
Lymphs Abs: 1.9 10*3/uL (ref 0.7–4.0)
MCH: 28.6 pg (ref 26.0–34.0)
MCHC: 33.7 g/dL (ref 30.0–36.0)
MCV: 85 fL (ref 80.0–100.0)
Monocytes Absolute: 0.5 10*3/uL (ref 0.1–1.0)
Monocytes Relative: 7 %
Neutro Abs: 4.3 10*3/uL (ref 1.7–7.7)
Neutrophils Relative %: 63 %
Platelets: 288 10*3/uL (ref 150–400)
RBC: 4.47 MIL/uL (ref 3.87–5.11)
RDW: 12.9 % (ref 11.5–15.5)
WBC: 6.9 10*3/uL (ref 4.0–10.5)
nRBC: 0 % (ref 0.0–0.2)

## 2021-02-10 LAB — COMPREHENSIVE METABOLIC PANEL
ALT: 10 U/L (ref 0–44)
AST: 14 U/L — ABNORMAL LOW (ref 15–41)
Albumin: 4.1 g/dL (ref 3.5–5.0)
Alkaline Phosphatase: 89 U/L (ref 38–126)
Anion gap: 8 (ref 5–15)
BUN: 8 mg/dL (ref 6–20)
CO2: 26 mmol/L (ref 22–32)
Calcium: 9.2 mg/dL (ref 8.9–10.3)
Chloride: 99 mmol/L (ref 98–111)
Creatinine, Ser: 0.7 mg/dL (ref 0.44–1.00)
GFR, Estimated: >60 mL/min (ref >60)
Glucose, Bld: 92 mg/dL (ref 70–99)
Potassium: 3.4 mmol/L — ABNORMAL LOW (ref 3.5–5.1)
Sodium: 133 mmol/L — ABNORMAL LOW (ref 135–145)
Total Bilirubin: 0.4 mg/dL (ref 0.3–1.2)
Total Protein: 7.3 g/dL (ref 6.5–8.1)

## 2021-02-10 LAB — TROPONIN I (HIGH SENSITIVITY): Troponin I (High Sensitivity): 3 ng/L (ref <18)

## 2021-02-10 LAB — LIPASE, BLOOD: Lipase: 108 U/L — ABNORMAL HIGH (ref 11–51)

## 2021-02-10 NOTE — ED Triage Notes (Signed)
CP onset last night midsternal 5/10 described as "pulled muscle", "taking breath away".

## 2021-02-10 NOTE — ED Provider Notes (Signed)
Emergency Medicine Provider Triage Evaluation Note  Cheryl Patterson , a 50 y.o. female  was evaluated in triage.  Pt complains of chest pain, nausea, lightheadedness.  Chest pain started yesterday.  Today at work she had an overwhelming sensation of stress and anxiety, felt lightheaded and nauseous.  Still does not feel well today.  No previous history of heart problems.  Review of Systems  Positive: Cp, nausea, lightheaded Negative: cough  Physical Exam  BP 129/77 (BP Location: Right Arm)   Pulse 81   Temp 98.1 F (36.7 C) (Oral)   Resp 18   LMP 12/02/2014   SpO2 99%  Gen:   Awake, no distress   Resp:  Normal effort  MSK:   Moves extremities without difficulty. No leg swelling Other:  No ttp of abd  Medical Decision Making  Medically screening exam initiated at 3:03 PM.  Appropriate orders placed.  Bristal Steffy Ribeiro was informed that the remainder of the evaluation will be completed by another provider, this initial triage assessment does not replace that evaluation, and the importance of remaining in the ED until their evaluation is complete.  Labs, ekg, cxr   Franchot Heidelberg, PA-C 02/10/21 1503    Tegeler, Gwenyth Allegra, MD 02/10/21 [phone]

## 2021-02-19 ENCOUNTER — Other Ambulatory Visit: Payer: Self-pay | Admitting: Family Medicine

## 2021-02-19 DIAGNOSIS — F988 Other specified behavioral and emotional disorders with onset usually occurring in childhood and adolescence: Secondary | ICD-10-CM

## 2021-02-20 MED ORDER — AMPHETAMINE-DEXTROAMPHETAMINE 20 MG PO TABS: 20.0000 mg | ORAL_TABLET | Freq: Two times a day (BID) | ORAL | 0 refills | Status: DC

## 2021-02-20 NOTE — Telephone Encounter (Signed)
Requesting: ADDERALL   Contract:   UDS: 06/02/20 Last Visit: 12/02/20 Next Visit: 06/08/21 Last Refill: 12/02/20 3 RXS    Please Advise

## 2021-02-22 ENCOUNTER — Encounter: Payer: Self-pay | Admitting: Family Medicine

## 2021-02-23 NOTE — Telephone Encounter (Signed)
Pt seen in the ED on 02/10/21

## 2021-03-03 ENCOUNTER — Other Ambulatory Visit: Payer: Self-pay | Admitting: Family Medicine

## 2021-03-31 ENCOUNTER — Other Ambulatory Visit: Payer: Self-pay | Admitting: Family Medicine

## 2021-03-31 DIAGNOSIS — R079 Chest pain, unspecified: Secondary | ICD-10-CM

## 2021-03-31 DIAGNOSIS — R109 Unspecified abdominal pain: Secondary | ICD-10-CM

## 2021-04-27 ENCOUNTER — Encounter: Payer: Self-pay | Admitting: Gastroenterology

## 2021-05-07 ENCOUNTER — Encounter: Payer: Self-pay | Admitting: Family Medicine

## 2021-05-07 ENCOUNTER — Other Ambulatory Visit: Payer: Self-pay | Admitting: Family Medicine

## 2021-05-07 DIAGNOSIS — F988 Other specified behavioral and emotional disorders with onset usually occurring in childhood and adolescence: Secondary | ICD-10-CM

## 2021-05-08 MED ORDER — AMPHETAMINE-DEXTROAMPHETAMINE 20 MG PO TABS: 20.0000 mg | ORAL_TABLET | Freq: Two times a day (BID) | ORAL | 0 refills | Status: DC

## 2021-05-08 NOTE — Telephone Encounter (Signed)
Requesting: Adderall  Contract: 06/02/2020 UDS: 06/02/2020 Last OV: 12/02/2020 Next OV: 06/08/2021 Last Refill: 02/20/2021, #60--0 RF Database:   Please advise

## 2021-05-13 ENCOUNTER — Other Ambulatory Visit: Payer: Self-pay | Admitting: Family Medicine

## 2021-05-13 DIAGNOSIS — F988 Other specified behavioral and emotional disorders with onset usually occurring in childhood and adolescence: Secondary | ICD-10-CM

## 2021-05-13 MED ORDER — AMPHETAMINE-DEXTROAMPHETAMINE 20 MG PO TABS: 20.0000 mg | ORAL_TABLET | Freq: Two times a day (BID) | ORAL | 0 refills | Status: DC

## 2021-06-02 ENCOUNTER — Ambulatory Visit (INDEPENDENT_AMBULATORY_CARE_PROVIDER_SITE_OTHER): Payer: Managed Care, Other (non HMO) | Admitting: Family Medicine

## 2021-06-02 ENCOUNTER — Encounter: Payer: Self-pay | Admitting: Family Medicine

## 2021-06-02 VITALS — BP 120/70 | HR 72 | Temp 98.6°F | Resp 12 | Ht 67.0 in | Wt 217.7 lb

## 2021-06-02 DIAGNOSIS — F988 Other specified behavioral and emotional disorders with onset usually occurring in childhood and adolescence: Secondary | ICD-10-CM

## 2021-06-02 DIAGNOSIS — Z79899 Other long term (current) drug therapy: Secondary | ICD-10-CM

## 2021-06-02 DIAGNOSIS — K582 Mixed irritable bowel syndrome: Secondary | ICD-10-CM | POA: Diagnosis not present

## 2021-06-02 DIAGNOSIS — E785 Hyperlipidemia, unspecified: Secondary | ICD-10-CM | POA: Diagnosis not present

## 2021-06-02 DIAGNOSIS — R1030 Lower abdominal pain, unspecified: Secondary | ICD-10-CM | POA: Diagnosis not present

## 2021-06-02 NOTE — Patient Instructions (Signed)
Living With Attention Deficit Hyperactivity Disorder If you have been diagnosed with attention deficit hyperactivity disorder (ADHD), you may be relieved that you now know why you have felt or behaved a certain way. Still, you may feel overwhelmed about the treatment ahead. You may also wonder how to get the support you need and how to deal with the condition day-to-day. With treatment and support, you can live with ADHD and manage your symptoms. How to manage lifestyle changes Managing stress Stress is your body's reaction to life changes and events, both good and bad. To cope with the stress of an ADHD diagnosis, it may help to: Learn more about ADHD. Exercise regularly. Even a short daily walk can lower stress levels. Participate in training or education programs (including social skills training classes) that teach you to deal with symptoms.  Medicines Your health care provider may suggest certain medicines if he or she feels that they will help to improve your condition. Stimulant medicines are usually prescribed to treat ADHD, and therapy may also be prescribed. It is important to: Avoid using alcohol and other substances that may prevent your medicines from working properly (may interact). Talk with your pharmacist or health care provider about all the medicines that you take, their possible side effects, and what medicines are safe to take together. Make it your goal to take part in all treatment decisions (shared decision-making). Ask about possible side effects of medicines that your health care provider recommends, and tell him or her how you feel about having those side effects. It is best if shared decision-making with your health care provider is part of your total treatment plan. Relationships To strengthen your relationships with family members while treating your condition, consider taking part in family therapy. You might also attend self-help groups alone or with a loved one. Be  honest about how your symptoms affect your relationships. Make an effort to communicate respectfully instead of fighting, and find ways to show others that you care. Psychotherapy may be useful in helping you cope with how ADHD affects your relationships. How to recognize changes in your condition The following signs may mean that your treatment is working well and your condition is improving: Consistently being on time for appointments. Being more organized at home and work. Other people noticing improvements in your behavior. Achieving goals that you set for yourself. Thinking more clearly. The following signs may mean that your treatment is not working very well: Feeling impatience or more confusion. Missing, forgetting, or being late for appointments. An increasing sense of disorganization and messiness. More difficulty in reaching goals that you set for yourself. Loved ones becoming angry or frustrated with you. Follow these instructions at home: Take over-the-counter and prescription medicines only as told by your health care provider. Check with your health care provider before taking any new medicines. Create structure and an organized atmosphere at home. For example: Make a list of tasks, then rank them from most important to least important. Work on one task at a time until your listed tasks are done. Make a daily schedule and follow it consistently every day. Use an appointment calendar, and check it 2 or 3 times a day to keep on track. Keep it with you when you leave the house. Create spaces where you keep certain things, and always put things back in their places after you use them. Keep all follow-up visits as told by your health care provider. This is important. Where to find support Talking to others    Keep emotion out of important discussions and speak in a calm, logical way. Listen closely and patiently to your loved ones. Try to understand their point of view, and try to  avoid getting defensive. Take responsibility for the consequences of your actions. Ask that others do not take your behaviors personally. Aim to solve problems as they come up, and express your feelings instead of bottling them up. Talk openly about what you need from your loved ones and how they can support you. Consider going to family therapy sessions or having your family meet with a specialist who deals with ADHD-related behavior problems. Finances Not all insurance plans cover mental health care, so it is important to check with your insurance carrier. If paying for co-pays or counseling services is a problem, search for a local or county mental health care center. Public mental health care services may be offered there at a low cost or no cost when you are not able to see a private health care provider. If you are taking medicine for ADHD, you may be able to get the generic form, which may be less expensive than brand-name medicine. Some makers of prescription medicines also offer help to patients who cannot afford the medicines that they need. Questions to ask your health care provider: What are the risks and benefits of taking medicines? Would I benefit from therapy? How often should I follow up with a health care provider? Contact a health care provider if: You have side effects from your medicines, such as: Repeated muscle twitches, coughing, or speech outbursts. Sleep problems. Loss of appetite. Depression. New or worsening behavior problems. Dizziness. Unusually fast heartbeat. Stomach pains. Headaches. Get help right away if: You have a severe reaction to a medicine. Your behavior suddenly gets worse. Summary With treatment and support, you can live with ADHD and manage your symptoms. The medicines that are most often prescribed for ADHD are stimulants. Consider taking part in family therapy or self-help groups with family members or friends. When you talk with friends  and family about your ADHD, be patient and communicate openly. Take over-the-counter and prescription medicines only as told by your health care provider. Check with your health care provider before taking any new medicines. This information is not intended to replace advice given to you by your health care provider. Make sure you discuss any questions you have with your health care provider. Document Revised: 09/19/2019 Document Reviewed: 09/19/2019 Elsevier Patient Education  2022 [employer]  

## 2021-06-02 NOTE — Progress Notes (Signed)
Subjective:   By signing my name below, I, Shehryar Baig, attest that this documentation has been prepared under the direction and in the presence of Ann Held, DO  06/02/2021     Patient ID: Cheryl Patterson, female    DOB: 05/29/1970, 51 y.o.   MRN: [id number]  Chief Complaint  Patient presents with   Follow-up    HPI Patient is in today for a office visit.  She complains of occasional stomach pain. The pain is mainly in her upper abdomen. She has occasional constipation as well as loose stools. She continues taking 40 mg Protonix daily PO. She also takes OTC miralax to manage her constipation. She notes her stomach pain started after her stress increased from taking care of her husband.  Her blood pressure is doing well during this visit.  BP Readings from Last 3 Encounters:  06/02/21 120/70  02/10/21 129/77  12/02/20 118/80   Pulse Readings from Last 3 Encounters:  06/02/21 72  02/10/21 81  12/02/20 81   She continues taking 20 mg adderall 2x daily PO and reports no new issues while taking it. She reports having increased stress due to taking care of her husband post stroke. She is interested in seeking out support group or help in managing her husband.    Past Medical History:  Diagnosis Date   ADD (attention deficit disorder)    Allergy    Anxiety    Asthma    per pt- bronchitis brings on the symptoms   Bipolar depression ([place])    Chronic headaches    Chronic kidney disease    kidney stones   Constipation    Depression    [facility]---suicidal ideation   Dyspnea    GERD (gastroesophageal reflux disease)    Hyperlipidemia    no meds taken as of 04-20-16   IBS (irritable bowel syndrome)    Joint pain    Kidney infection    Lactose intolerance    Prediabetes    Ruptured disc, thoracic    Sepsis ([place])    2014-  d/t urinary infection   UTI (urinary tract infection)    Vitamin D deficiency     Past Surgical History:  Procedure  Laterality Date   BREAST SURGERY     left breast lump removed   COLONOSCOPY     CYSTOSCOPY N/A 03/18/2015   Procedure: CYSTOSCOPY;  Surgeon: Terrance Mass, MD;  Location: [facility];  Service: Gynecology;  Laterality: N/A;   KIDNEY STONE SURGERY     LAPAROSCOPIC BILATERAL SALPINGECTOMY Bilateral 03/18/2015   Procedure: LAPAROSCOPIC BILATERAL SALPINGECTOMY;  Surgeon: Terrance Mass, MD;  Location: [facility];  Service: Gynecology;  Laterality: Bilateral;   LAPAROSCOPIC VAGINAL HYSTERECTOMY WITH SALPINGO OOPHORECTOMY N/A 03/18/2015   Procedure: LAPAROSCOPIC ASSISTED VAGINAL HYSTERECTOMY WITH SALPINGO OOPHORECTOMY;  Surgeon: Terrance Mass, MD;  Location: [facility];  Service: Gynecology;  Laterality: N/A;   LEEP  2005   LIPOMA EXCISION     MOUTH SURGERY     OTHER SURGICAL HISTORY     RENAL STONE REMOVAL   TUBAL LIGATION      Family History  Problem Relation Age of Onset   Colon cancer Maternal Aunt    Cancer Maternal Aunt        colon   Breast cancer Mother    Colon cancer Mother 45   Cancer Mother 84       colon   Anxiety disorder Mother  Hypertension Father    Hyperlipidemia Father    Hypertension Other    Stroke Paternal Grandmother    Hypertension Paternal Grandmother    Heart disease Paternal Grandmother    Breast cancer Paternal Aunt    Cancer Paternal Aunt        breast   Uterine cancer Maternal Aunt    Brain cancer Maternal Aunt    Diabetes Maternal Grandfather    Cancer Maternal Uncle        COLON   Colon cancer Paternal Uncle    Esophageal cancer Neg Hx    Rectal cancer Neg Hx    Stomach cancer Neg Hx     Social History   Socioeconomic History   Marital status: Married    Spouse name: Tim   Number of children: 2   Years of education: Not on file   Highest education level: Not on file  Occupational History   Occupation: Lobbyist: [employer]  Tobacco Use   Smoking status: Never    Passive exposure: Never   Smokeless  tobacco: Never  Vaping Use   Vaping Use: Never used  Substance and Sexual Activity   Alcohol use: No    Alcohol/week: 0.0 standard drinks   Drug use: No   Sexual activity: Yes    Partners: Male    Birth control/protection: None  Other Topics Concern   Not on file  Social History Narrative   Exercise--walk qd                    Video--  rare   Social Determinants of Health   Financial Resource Strain: Not on file  Food Insecurity: Not on file  Transportation Needs: Not on file  Physical Activity: Not on file  Stress: Not on file  Social Connections: Not on file  Intimate Partner Violence: Not on file    Outpatient Medications Prior to Visit  Medication Sig Dispense Refill   albuterol (VENTOLIN HFA) 108 (90 Base) MCG/ACT inhaler Inhale 2 puffs into the lungs every 6 (six) hours as needed for wheezing or shortness of breath. 18 g 5   amphetamine-dextroamphetamine (ADDERALL) 20 MG tablet Take 1 tablet (20 mg total) by mouth 2 (two) times daily. 60 tablet 0   amphetamine-dextroamphetamine (ADDERALL) 20 MG tablet Take 1 tablet (20 mg total) by mouth 2 (two) times daily. 60 tablet 0   amphetamine-dextroamphetamine (ADDERALL) 20 MG tablet Take 1 tablet (20 mg total) by mouth 2 (two) times daily. 60 tablet 0   FLUoxetine (PROZAC) 20 MG tablet Take 1 tablet (20 mg total) by mouth daily. 90 tablet 3   loratadine (CLARITIN) 10 MG tablet Take 1 tablet (10 mg total) by mouth daily. 30 tablet 11   pantoprazole (PROTONIX) 40 MG tablet TAKE 1 TABLET BY MOUTH EVERY DAY 90 tablet 1   rosuvastatin (CRESTOR) 10 MG tablet TAKE 1 TABLET BY MOUTH EVERY DAY 90 tablet 1   Vitamin D, Ergocalciferol, (DRISDOL) 1.25 MG (50000 UNIT) CAPS capsule Take 1 capsule (50,000 Units total) by mouth every 7 (seven) days. 12 capsule 3   Facility-Administered Medications Prior to Visit  Medication Dose Route Frequency Provider Last Rate Last Admin   0.9 %  sodium chloride infusion  500 mL Intravenous Continuous  Milus Banister, MD        Allergies  Allergen Reactions   Codeine     Itchy throat, whelps on skin   Other Swelling and Rash  tomatoes    Review of Systems  Constitutional:  Negative for fever and malaise/fatigue.  HENT:  Negative for congestion.   Eyes:  Negative for blurred vision.  Respiratory:  Negative for shortness of breath.   Cardiovascular:  Negative for chest pain, palpitations and leg swelling.  Gastrointestinal:  Positive for abdominal pain (stomach pains). Negative for blood in stool and nausea.  Genitourinary:  Negative for dysuria and frequency.  Musculoskeletal:  Negative for falls.  Skin:  Negative for rash.  Neurological:  Negative for dizziness, loss of consciousness and headaches.  Endo/Heme/Allergies:  Negative for environmental allergies.  Psychiatric/Behavioral:  Negative for depression. The patient is not nervous/anxious.        (+)anxiety      Objective:    Physical Exam Vitals and nursing note reviewed.  Constitutional:      General: She is not in acute distress.    Appearance: Normal appearance. She is not ill-appearing.  HENT:     Head: Normocephalic and atraumatic.     Right Ear: External ear normal.     Left Ear: External ear normal.  Eyes:     Extraocular Movements: Extraocular movements intact.     Pupils: Pupils are equal, round, and reactive to light.  Cardiovascular:     Rate and Rhythm: Normal rate and regular rhythm.     Heart sounds: Normal heart sounds. No murmur heard.   No gallop.  Pulmonary:     Effort: Pulmonary effort is normal. No respiratory distress.     Breath sounds: Normal breath sounds. No wheezing or rales.  Abdominal:     General: Bowel sounds are normal. There is no distension.     Palpations: Abdomen is soft.     Tenderness: There is no abdominal tenderness. There is no guarding or rebound.     Comments: Lower abd cramping-- comes and goes   Skin:    General: Skin is warm and dry.  Neurological:      Mental Status: She is alert and oriented to person, place, and time.  Psychiatric:        Behavior: Behavior normal.        Judgment: Judgment normal.    BP 120/70 (BP Location: Left Arm, Cuff Size: Large)    Pulse 72    Temp 98.6 F (37 C) (Oral)    Resp 12    Ht 5\' 7"  (1.702 m)    Wt 217 lb 11.2 oz (98.7 kg)    LMP 12/02/2014    SpO2 100%    BMI 34.10 kg/m  Wt Readings from Last 3 Encounters:  06/02/21 217 lb 11.2 oz (98.7 kg)  02/10/21 215 lb (97.5 kg)  12/02/20 221 lb 11.2 oz (100.6 kg)    Diabetic Foot Exam - Simple   No data filed    Lab Results  Component Value Date   WBC 6.9 02/10/2021   HGB 12.8 02/10/2021   HCT 38.0 02/10/2021   PLT 288 02/10/2021   GLUCOSE 92 02/10/2021   CHOL 222 (H) 12/02/2020   TRIG 74.0 12/02/2020   HDL 44.80 12/02/2020   LDLDIRECT 144.3 06/25/2010   LDLCALC 162 (H) 12/02/2020   ALT 10 02/10/2021   AST 14 (L) 02/10/2021   NA 133 (L) 02/10/2021   K 3.4 (L) 02/10/2021   CL 99 02/10/2021   CREATININE 0.70 02/10/2021   BUN 8 02/10/2021   CO2 26 02/10/2021   TSH 1.39 12/02/2020   HGBA1C 5.9 04/30/2019    Lab Results  Component Value Date   TSH 1.39 12/02/2020   Lab Results  Component Value Date   WBC 6.9 02/10/2021   HGB 12.8 02/10/2021   HCT 38.0 02/10/2021   MCV 85.0 02/10/2021   PLT 288 02/10/2021   Lab Results  Component Value Date   NA 133 (L) 02/10/2021   K 3.4 (L) 02/10/2021   CO2 26 02/10/2021   GLUCOSE 92 02/10/2021   BUN 8 02/10/2021   CREATININE 0.70 02/10/2021   BILITOT 0.4 02/10/2021   ALKPHOS 89 02/10/2021   AST 14 (L) 02/10/2021   ALT 10 02/10/2021   PROT 7.3 02/10/2021   ALBUMIN 4.1 02/10/2021   CALCIUM 9.2 02/10/2021   ANIONGAP 8 02/10/2021   GFR 77.58 12/02/2020   Lab Results  Component Value Date   CHOL 222 (H) 12/02/2020   Lab Results  Component Value Date   HDL 44.80 12/02/2020   Lab Results  Component Value Date   LDLCALC 162 (H) 12/02/2020   Lab Results  Component Value Date   TRIG  74.0 12/02/2020   Lab Results  Component Value Date   CHOLHDL 5 12/02/2020   Lab Results  Component Value Date   HGBA1C 5.9 04/30/2019       Assessment & Plan:   Problem List Items Addressed This Visit       Unprioritized   Attention deficit disorder    con't adderall uds and contract updated  Symptoms stable      Relevant Orders   Drug Monitoring Panel [id number] , Urine   Irritable bowel syndrome with both constipation and diarrhea    Refer to gi Advised to con't protonix and use probiotic       Relevant Orders   Ambulatory referral to Gastroenterology   Lower abdominal pain   Relevant Orders   Lipase   Amylase   Other Visit Diagnoses     Hyperlipidemia, unspecified hyperlipidemia type    -  Primary   Relevant Orders   Comprehensive metabolic panel   CBC with Differential/Platelet   Lipid panel   High risk medication use       Relevant Orders   Drug Monitoring Panel [id number] , Urine        No orders of the defined types were placed in this encounter.   I, Ann Held, DO, personally preformed the services described in this documentation.  All medical record entries made by the scribe were at my direction and in my presence.  I have reviewed the chart and discharge instructions (if applicable) and agree that the record reflects my personal performance and is accurate and complete. 06/02/2021   I,Shehryar Baig,acting as a scribe for Ann Held, DO.,have documented all relevant documentation on the behalf of Ann Held, DO,as directed by  Ann Held, DO while in the presence of Ann Held, DO.   Ann Held, DO

## 2021-06-03 DIAGNOSIS — K582 Mixed irritable bowel syndrome: Secondary | ICD-10-CM | POA: Insufficient documentation

## 2021-06-03 DIAGNOSIS — R1030 Lower abdominal pain, unspecified: Secondary | ICD-10-CM | POA: Insufficient documentation

## 2021-06-03 LAB — CBC WITH DIFFERENTIAL/PLATELET
Basophils Absolute: 0.1 10*3/uL (ref 0.0–0.1)
Basophils Relative: 1.2 % (ref 0.0–3.0)
Eosinophils Absolute: 0.2 10*3/uL (ref 0.0–0.7)
Eosinophils Relative: 2.8 % (ref 0.0–5.0)
HCT: 40.2 % (ref 36.0–46.0)
Hemoglobin: 13.1 g/dL (ref 12.0–15.0)
Lymphocytes Relative: 38.1 % (ref 12.0–46.0)
Lymphs Abs: 2.1 10*3/uL (ref 0.7–4.0)
MCHC: 32.6 g/dL (ref 30.0–36.0)
MCV: 86.5 fl (ref 78.0–100.0)
Monocytes Absolute: 0.3 10*3/uL (ref 0.1–1.0)
Monocytes Relative: 5.3 % (ref 3.0–12.0)
Neutro Abs: 2.9 10*3/uL (ref 1.4–7.7)
Neutrophils Relative %: 52.6 % (ref 43.0–77.0)
Platelets: 296 10*3/uL (ref 150.0–400.0)
RBC: 4.65 Mil/uL (ref 3.87–5.11)
RDW: 12.9 % (ref 11.5–15.5)
WBC: 5.5 10*3/uL (ref 4.0–10.5)

## 2021-06-03 NOTE — Assessment & Plan Note (Signed)
Refer to gi Advised to con't protonix and use probiotic

## 2021-06-03 NOTE — Assessment & Plan Note (Signed)
con't adderall uds and contract updated  Symptoms stable

## 2021-06-04 LAB — DM TEMPLATE

## 2021-06-04 LAB — LIPID PANEL
Cholesterol: 193 mg/dL (ref 0–200)
HDL: 45.4 mg/dL (ref >39.00)
LDL Cholesterol: 132 mg/dL — ABNORMAL HIGH (ref 0–99)
NonHDL: 147.65
Total CHOL/HDL Ratio: 4
Triglycerides: 76 mg/dL (ref 0.0–149.0)
VLDL: 15.2 mg/dL (ref 0.0–40.0)

## 2021-06-04 LAB — COMPREHENSIVE METABOLIC PANEL
ALT: 14 U/L (ref 0–35)
AST: 17 U/L (ref 0–37)
Albumin: 4.4 g/dL (ref 3.5–5.2)
Alkaline Phosphatase: 90 U/L (ref 39–117)
BUN: 13 mg/dL (ref 6–23)
CO2: 34 mEq/L — ABNORMAL HIGH (ref 19–32)
Calcium: 9.7 mg/dL (ref 8.4–10.5)
Chloride: 100 mEq/L (ref 96–112)
Creatinine, Ser: 0.93 mg/dL (ref 0.40–1.20)
GFR: 71.36 mL/min (ref >60.00)
Glucose, Bld: 89 mg/dL (ref 70–99)
Potassium: 4.2 mEq/L (ref 3.5–5.1)
Sodium: 139 mEq/L (ref 135–145)
Total Bilirubin: 0.3 mg/dL (ref 0.2–1.2)
Total Protein: 7.7 g/dL (ref 6.0–8.3)

## 2021-06-04 LAB — AMYLASE: Amylase: 54 U/L (ref 27–131)

## 2021-06-04 LAB — LIPASE: Lipase: 55 U/L (ref 11.0–59.0)

## 2021-06-05 LAB — DRUG MONITORING PANEL 376104, URINE
Amphetamine: 4441 ng/mL — ABNORMAL HIGH (ref <250)
Amphetamines: POSITIVE ng/mL — AB (ref <500)
Barbiturates: NEGATIVE ng/mL (ref <300)
Benzodiazepines: NEGATIVE ng/mL (ref <100)
Cocaine Metabolite: NEGATIVE ng/mL (ref <150)
Desmethyltramadol: NEGATIVE ng/mL (ref <100)
Methamphetamine: NEGATIVE ng/mL (ref <250)
Opiates: NEGATIVE ng/mL (ref <100)
Oxycodone: NEGATIVE ng/mL (ref <100)
Tramadol: NEGATIVE ng/mL (ref <100)

## 2021-06-05 LAB — DM TEMPLATE

## 2021-06-08 ENCOUNTER — Ambulatory Visit: Payer: PRIVATE HEALTH INSURANCE | Admitting: Family Medicine

## 2021-06-30 ENCOUNTER — Other Ambulatory Visit: Payer: Self-pay | Admitting: Family Medicine

## 2021-06-30 DIAGNOSIS — F988 Other specified behavioral and emotional disorders with onset usually occurring in childhood and adolescence: Secondary | ICD-10-CM

## 2021-07-01 MED ORDER — AMPHETAMINE-DEXTROAMPHETAMINE 20 MG PO TABS: 20.0000 mg | ORAL_TABLET | Freq: Two times a day (BID) | ORAL | 0 refills | Status: DC

## 2021-07-01 NOTE — Telephone Encounter (Signed)
Requesting:adderall  ?Contract:06/23/2021 ?UDS:06/02/2021 ?Last Visit:06/02/2021 ?Next Visit:NA ?Last Refill:05/13/2021 ? ?Please Advise ? ?

## 2021-07-16 ENCOUNTER — Ambulatory Visit (INDEPENDENT_AMBULATORY_CARE_PROVIDER_SITE_OTHER): Payer: Managed Care, Other (non HMO) | Admitting: Family Medicine

## 2021-07-16 ENCOUNTER — Encounter: Payer: Self-pay | Admitting: Family Medicine

## 2021-07-16 VITALS — BP 118/76 | HR 85 | Temp 98.6°F | Resp 18 | Ht 67.0 in | Wt 220.0 lb

## 2021-07-16 DIAGNOSIS — R051 Acute cough: Secondary | ICD-10-CM | POA: Diagnosis not present

## 2021-07-16 DIAGNOSIS — H6692 Otitis media, unspecified, left ear: Secondary | ICD-10-CM

## 2021-07-16 MED ORDER — PREDNISONE 10 MG PO TABS: ORAL_TABLET | ORAL | 0 refills | Status: DC

## 2021-07-16 MED ORDER — FLUCONAZOLE 150 MG PO TABS: ORAL_TABLET | ORAL | 0 refills | Status: DC

## 2021-07-16 MED ORDER — FLUTICASONE PROPIONATE 50 MCG/ACT NA SUSP: 2.0000 | Freq: Every day | NASAL | 6 refills | Status: DC

## 2021-07-16 MED ORDER — CEFDINIR 300 MG PO CAPS: 300.0000 mg | ORAL_CAPSULE | Freq: Two times a day (BID) | ORAL | 0 refills | Status: DC

## 2021-07-16 NOTE — Patient Instructions (Signed)
Otitis Media With Effusion, Adult ?Otitis media with effusion (OME) is inflammation and fluid (effusion) in the middle ear without having an ear infection. The middle ear is the space behind the eardrum. The middle ear is connected to the back of the throat by a narrow tube (eustachian tube). Normally the eustachian tube drains fluid out of the middle ear. A swollen eustachian tube can become blocked and cause fluid to collect in the middle ear. ?OME often goes away without treatment. Sometimes OME can lead to hearing problems and recurrent acute ear infections (acute otitis media). These conditions may require treatment. ?What are the causes? ?OME is caused by a blocked eustachian tube. This can result from: ?Allergies. ?Upper respiratory infections. ?Enlarged adenoids. The adenoids are areas of soft tissue located high in the back of the throat, behind the nose and the roof of the mouth. They are part of the body's natural defense system (immune system). ?Rapid changes in pressure, like when an airplane is descending or during scuba diving. ?In some cases, the cause of this condition is not known. ?What are the signs or symptoms? ?Common symptoms of this condition include: ?A feeling of fullness in your ear. ?Decreased hearing in the affected ear. ?Fluid draining into the ear canal. ?Pain in the ear. ?In some cases, there are no symptoms. ?How is this diagnosed? ?A health care provider can diagnose OME based on signs and symptoms of the condition. Your provider will also do a physical exam to check for fluid behind the eardrum. During the exam, your health care provider will use an instrument called an otoscope to look in your ear. ?Your health care provider may do other tests, such as: ?A hearing test. ?A tympanogram. This is a test that shows how well the eardrum moves in response to air pressure in the ear canal. It provides a graph for your health care provider to review. ?A pneumatic otoscopy. This is a test  to check how your eardrum moves in response to changes in pressure. It is done by squeezing a small amount of air into the ear. ?How is this treated? ?Treatment for OME depends on the cause of the condition and the severity of symptoms. The first step is often waiting to see if the fluid drains on its own in a few weeks. Home care treatment may include: ?Over-the-counter pain relievers. ?A warm, moist cloth placed over the ear. ?Severe cases may require a procedure to insert tubes in the ears (tympanostomy tubes) to drain the fluid. ?Follow these instructions at home: ?Take over-the-counter and prescription medicines only as told by your health care provider. ?Keep all follow-up visits. ?Contact a health care provider if: ?You have pain that gets worse. ?Hearing in your affected ear gets worse. ?You have fluid draining from your ear canal. ?You have dizziness. ?You develop a fever. ?Get help right away if: ?You develop a severe headache. ?You completely lose hearing in the affected ear. ?You have bleeding from your ear canal. ?You have sudden and severe pain in your ear. ?These symptoms may represent a serious problem that is an emergency. Do not wait to see if the symptoms will go away. Get medical help right away. Call your local emergency services (911 in the [place] Do not drive yourself to the hospital. ?Summary ?Otitis media with effusion (OME) is inflammation and fluid (effusion) in the middle ear without having an ear infection. ?A swollen eustachian tube can become blocked and cause fluid to collect in the middle ear. ?  Treatment for OME depends on the cause of the condition and the severity of symptoms. ?Many times, treatment is not needed because the fluid drains on its own in a few weeks. ?Sometimes OME can lead to hearing problems and recurrent acute ear infections (acute otitis media), which may require treatment. ?This information is not intended to replace advice given to you by your health care  provider. Make sure you discuss any questions you have with your health care provider. ?Document Revised: 07/31/2020 Document Reviewed: 07/31/2020 ?Elsevier Patient Education ? [place]. ? ?

## 2021-07-16 NOTE — Progress Notes (Signed)
? ?Subjective:  ? ?By signing my name below, I, Shehryar Baig, attest that this documentation has been prepared under the direction and in the presence of Ann Held, DO. 07/16/2021 ?   ? ? Patient ID: Cheryl Patterson, female    DOB: July 06, 1970, 51 y.o.   MRN: [id number] ? ?Chief Complaint  ?Patient presents with  ? Ear Fullness  ?  X1-2 weeks, pt states ear feel full and feel like there is water in them and has felt dizzy.   ? ? ?Ear Fullness  ?Associated symptoms include coughing. Pertinent negatives include no headaches, rash or vomiting.  ?Patient is in today for a office visit.  ? ?She complains of a sensation of fluid in her ears. Her ears feel like they are about to pop. She also thinks can occasionally hear fluid running through her ears. She is also feeling lightheaded. She has a cough but thinks it relates more to her seasonal allergies. She is taking OTC Claritin and benadryl at night. She has Flonase at home but thinks it is expired. She denies having any congestion, fever at this time. She is willing to take prednisone to help manage her symptoms. She is also requesting diflucan if she is taking anti-biotics due to developing yeast infections easily.  ? ? ?Past Medical History:  ?Diagnosis Date  ? ADD (attention deficit disorder)   ? Allergy   ? Anxiety   ? Asthma   ? per pt- bronchitis brings on the symptoms  ? Bipolar depression ([place])   ? Chronic headaches   ? Chronic kidney disease   ? kidney stones  ? Constipation   ? Depression   ? [facility]---suicidal ideation  ? Dyspnea   ? GERD (gastroesophageal reflux disease)   ? Hyperlipidemia   ? no meds taken as of 04-20-16  ? IBS (irritable bowel syndrome)   ? Joint pain   ? Kidney infection   ? Lactose intolerance   ? Prediabetes   ? Ruptured disc, thoracic   ? Sepsis ([place])   ? 2014-  d/t urinary infection  ? UTI (urinary tract infection)   ? Vitamin D deficiency   ? ? ?Past Surgical History:  ?Procedure Laterality Date  ? BREAST  SURGERY    ? left breast lump removed  ? COLONOSCOPY    ? CYSTOSCOPY N/A 03/18/2015  ? Procedure: CYSTOSCOPY;  Surgeon: Terrance Mass, MD;  Location: [facility];  Service: Gynecology;  Laterality: N/A;  ? KIDNEY STONE SURGERY    ? LAPAROSCOPIC BILATERAL SALPINGECTOMY Bilateral 03/18/2015  ? Procedure: LAPAROSCOPIC BILATERAL SALPINGECTOMY;  Surgeon: Terrance Mass, MD;  Location: [facility];  Service: Gynecology;  Laterality: Bilateral;  ? LAPAROSCOPIC VAGINAL HYSTERECTOMY WITH SALPINGO OOPHORECTOMY N/A 03/18/2015  ? Procedure: LAPAROSCOPIC ASSISTED VAGINAL HYSTERECTOMY WITH SALPINGO OOPHORECTOMY;  Surgeon: Terrance Mass, MD;  Location: [facility];  Service: Gynecology;  Laterality: N/A;  ? LEEP  2005  ? LIPOMA EXCISION    ? MOUTH SURGERY    ? OTHER SURGICAL HISTORY    ? RENAL STONE REMOVAL  ? TUBAL LIGATION    ? ? ?Family History  ?Problem Relation Age of Onset  ? Colon cancer Maternal Aunt   ? Cancer Maternal Aunt   ?     colon  ? Breast cancer Mother   ? Colon cancer Mother 20  ? Cancer Mother 35  ?     colon  ? Anxiety disorder Mother   ? Hypertension Father   ?  Hyperlipidemia Father   ? Hypertension Other   ? Stroke Paternal Grandmother   ? Hypertension Paternal Grandmother   ? Heart disease Paternal Grandmother   ? Breast cancer Paternal Aunt   ? Cancer Paternal Aunt   ?     breast  ? Uterine cancer Maternal Aunt   ? Brain cancer Maternal Aunt   ? Diabetes Maternal Grandfather   ? Cancer Maternal Uncle   ?     COLON  ? Colon cancer Paternal Uncle   ? Esophageal cancer Neg Hx   ? Rectal cancer Neg Hx   ? Stomach cancer Neg Hx   ? ? ?Social History  ? ?Socioeconomic History  ? Marital status: Married  ?  Spouse name: Octavia Bruckner  ? Number of children: 2  ? Years of education: Not on file  ? Highest education level: Not on file  ?Occupational History  ? Occupation: Heritage manager  ?  Employer: [employer]  ?Tobacco Use  ? Smoking status: Never  ?  Passive exposure: Never  ? Smokeless tobacco: Never  ?Vaping  Use  ? Vaping Use: Never used  ?Substance and Sexual Activity  ? Alcohol use: No  ?  Alcohol/week: 0.0 standard drinks  ? Drug use: No  ? Sexual activity: Yes  ?  Partners: Male  ?  Birth control/protection: None  ?Other Topics Concern  ? Not on file  ?Social History Narrative  ? Exercise--walk qd  ?                  Video--  rare  ? ?Social Determinants of Health  ? ?Financial Resource Strain: Not on file  ?Food Insecurity: Not on file  ?Transportation Needs: Not on file  ?Physical Activity: Not on file  ?Stress: Not on file  ?Social Connections: Not on file  ?Intimate Partner Violence: Not on file  ? ? ?Outpatient Medications Prior to Visit  ?Medication Sig Dispense Refill  ? albuterol (VENTOLIN HFA) 108 (90 Base) MCG/ACT inhaler Inhale 2 puffs into the lungs every 6 (six) hours as needed for wheezing or shortness of breath. 18 g 5  ? amphetamine-dextroamphetamine (ADDERALL) 20 MG tablet Take 1 tablet (20 mg total) by mouth 2 (two) times daily. 60 tablet 0  ? amphetamine-dextroamphetamine (ADDERALL) 20 MG tablet Take 1 tablet (20 mg total) by mouth 2 (two) times daily. 60 tablet 0  ? FLUoxetine (PROZAC) 20 MG tablet Take 1 tablet (20 mg total) by mouth daily. 90 tablet 3  ? pantoprazole (PROTONIX) 40 MG tablet TAKE 1 TABLET BY MOUTH EVERY DAY 90 tablet 1  ? rosuvastatin (CRESTOR) 10 MG tablet TAKE 1 TABLET BY MOUTH EVERY DAY 90 tablet 1  ? Vitamin D, Ergocalciferol, (DRISDOL) 1.25 MG (50000 UNIT) CAPS capsule Take 1 capsule (50,000 Units total) by mouth every 7 (seven) days. 12 capsule 3  ? amphetamine-dextroamphetamine (ADDERALL) 20 MG tablet Take 1 tablet (20 mg total) by mouth 2 (two) times daily. 60 tablet 0  ? loratadine (CLARITIN) 10 MG tablet Take 1 tablet (10 mg total) by mouth daily. 30 tablet 11  ? ?Facility-Administered Medications Prior to Visit  ?Medication Dose Route Frequency Provider Last Rate Last Admin  ? 0.9 %  sodium chloride infusion  500 mL Intravenous Continuous Milus Banister, MD       ? ? ?Allergies  ?Allergen Reactions  ? Codeine   ?  Itchy throat, whelps on skin  ? Other Swelling and Rash  ?  tomatoes  ? ? ?  Review of Systems  ?Constitutional:  Negative for fever and malaise/fatigue.  ?HENT:  Positive for ear pain. Negative for congestion.   ?     (+)fluid in ears  ?Eyes:  Negative for blurred vision.  ?Respiratory:  Positive for cough. Negative for sputum production and shortness of breath.   ?Cardiovascular:  Negative for chest pain, palpitations and leg swelling.  ?Gastrointestinal:  Negative for vomiting.  ?Musculoskeletal:  Negative for back pain.  ?Skin:  Negative for rash.  ?Neurological:  Negative for loss of consciousness and headaches.  ? ?   ?Objective:  ?  ?Physical Exam ?Vitals and nursing note reviewed.  ?Constitutional:   ?   General: She is not in acute distress. ?   Appearance: Normal appearance. She is not ill-appearing.  ?HENT:  ?   Head: Normocephalic and atraumatic.  ?   Right Ear: Tympanic membrane, ear canal and external ear normal.  ?   Left Ear: Ear canal and external ear normal. Tympanic membrane is erythematous.  ?   Ears:  ?   Comments: Fluid noted behind both TM ?L tm red  ?   Nose: Congestion and rhinorrhea present.  ?Eyes:  ?   Extraocular Movements: Extraocular movements intact.  ?   Pupils: Pupils are equal, round, and reactive to light.  ?Cardiovascular:  ?   Rate and Rhythm: Normal rate and regular rhythm.  ?   Heart sounds: Normal heart sounds. No murmur heard. ?  No gallop.  ?Pulmonary:  ?   Effort: Pulmonary effort is normal. No respiratory distress.  ?   Breath sounds: Normal breath sounds. No wheezing or rales.  ?Skin: ?   General: Skin is warm and dry.  ?Neurological:  ?   Mental Status: She is alert and oriented to person, place, and time.  ?Psychiatric:     ?   Judgment: Judgment normal.  ? ? ?BP 118/76 (BP Location: Left Arm, Patient Position: Sitting, Cuff Size: Large)   Pulse 85   Temp 98.6 ?F (37 ?C) (Oral)   Resp 18   Ht '5\' 7"'$  (1.702 m)   Wt  220 lb (99.8 kg)   LMP 12/02/2014   SpO2 97%   BMI 34.46 kg/m?  ?Wt Readings from Last 3 Encounters:  ?07/16/21 220 lb (99.8 kg)  ?06/02/21 217 lb 11.2 oz (98.7 kg)  ?02/10/21 215 lb (97.5 kg)  ? ? ?Diabetic Foot Ex

## 2021-08-07 ENCOUNTER — Other Ambulatory Visit: Payer: Self-pay | Admitting: Family Medicine

## 2021-08-07 DIAGNOSIS — H6692 Otitis media, unspecified, left ear: Secondary | ICD-10-CM

## 2021-09-07 ENCOUNTER — Encounter: Payer: Self-pay | Admitting: Family Medicine

## 2021-09-07 ENCOUNTER — Other Ambulatory Visit: Payer: Self-pay | Admitting: Family Medicine

## 2021-09-07 DIAGNOSIS — F988 Other specified behavioral and emotional disorders with onset usually occurring in childhood and adolescence: Secondary | ICD-10-CM

## 2021-09-08 MED ORDER — AMPHETAMINE-DEXTROAMPHETAMINE 20 MG PO TABS: 20.0000 mg | ORAL_TABLET | Freq: Two times a day (BID) | ORAL | 0 refills | Status: DC

## 2021-09-08 NOTE — Telephone Encounter (Signed)
Requesting: Adderall Contract: 06/02/2021 UDS: 06/02/2021 Last OV: 07/16/21 Next OV: N/A Last Refill: 07/01/2021, #[id number] RF Database:   Please advise

## 2021-09-28 ENCOUNTER — Encounter: Payer: Self-pay | Admitting: Family Medicine

## 2021-11-06 ENCOUNTER — Other Ambulatory Visit: Payer: Self-pay | Admitting: Family Medicine

## 2021-11-06 DIAGNOSIS — Z1231 Encounter for screening mammogram for malignant neoplasm of breast: Secondary | ICD-10-CM

## 2021-11-09 IMAGING — MG DIGITAL DIAGNOSTIC BILAT W/ TOMO W/ CAD
6 of 10 series · 6 of 30 positions shown · non-contrast
Comparison: Previous exam(s).

CLINICAL DATA: 49-year-old female with history of bilateral
COVID vaccine in the right arm approximately 6 weeks ago.

EXAM:
DIGITAL DIAGNOSTIC BILATERAL MAMMOGRAM WITH CAD AND TOMO
ULTRASOUND BILATERAL BREAST

[L CC synth-2D]
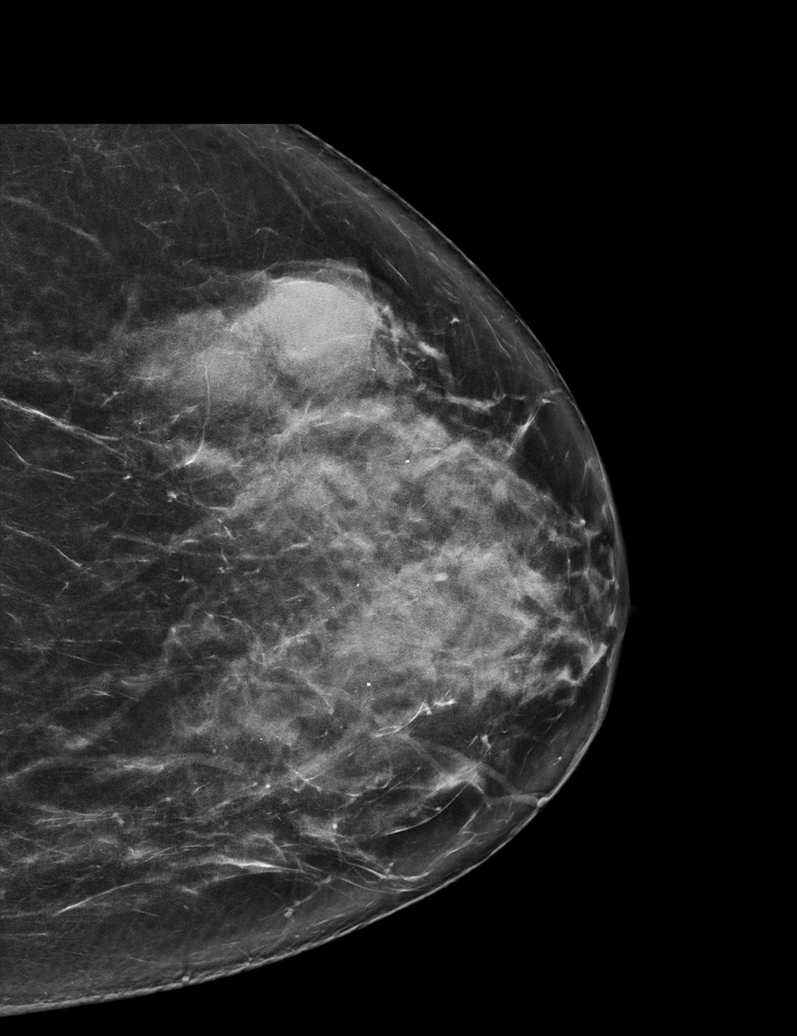

[L MLO synth-2D (1 of 2)]
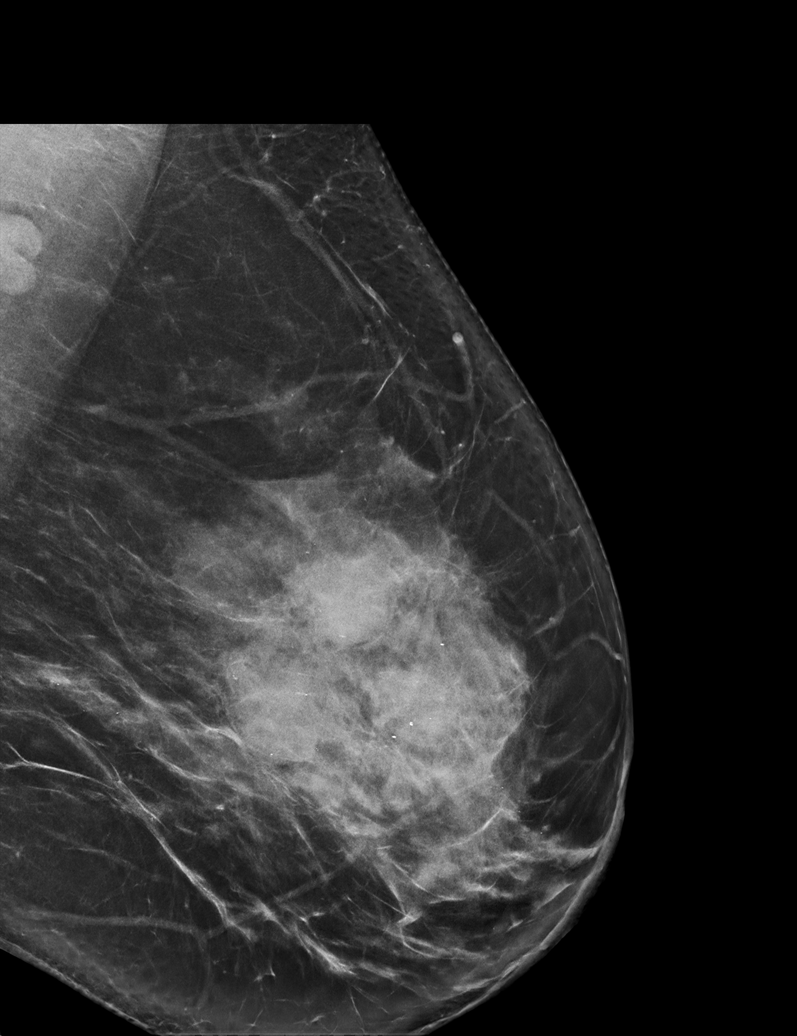

[R MLO synth-2D]
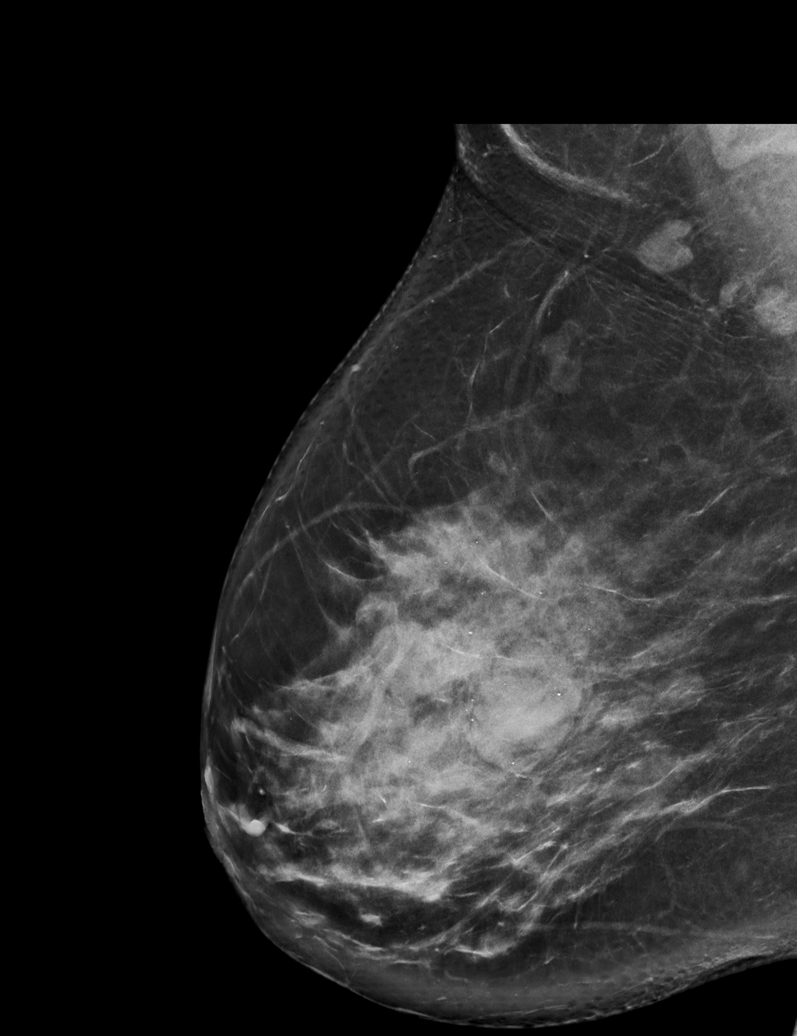

[R CC synth-2D]
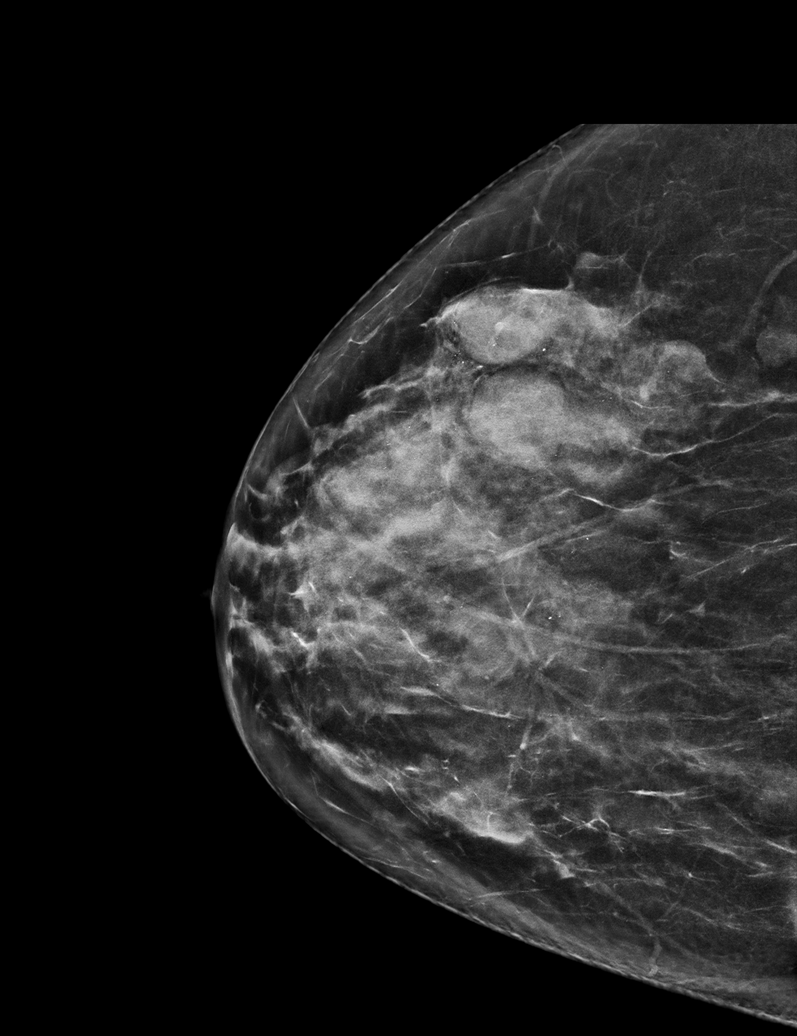

[L MLO synth-2D (2 of 2)]
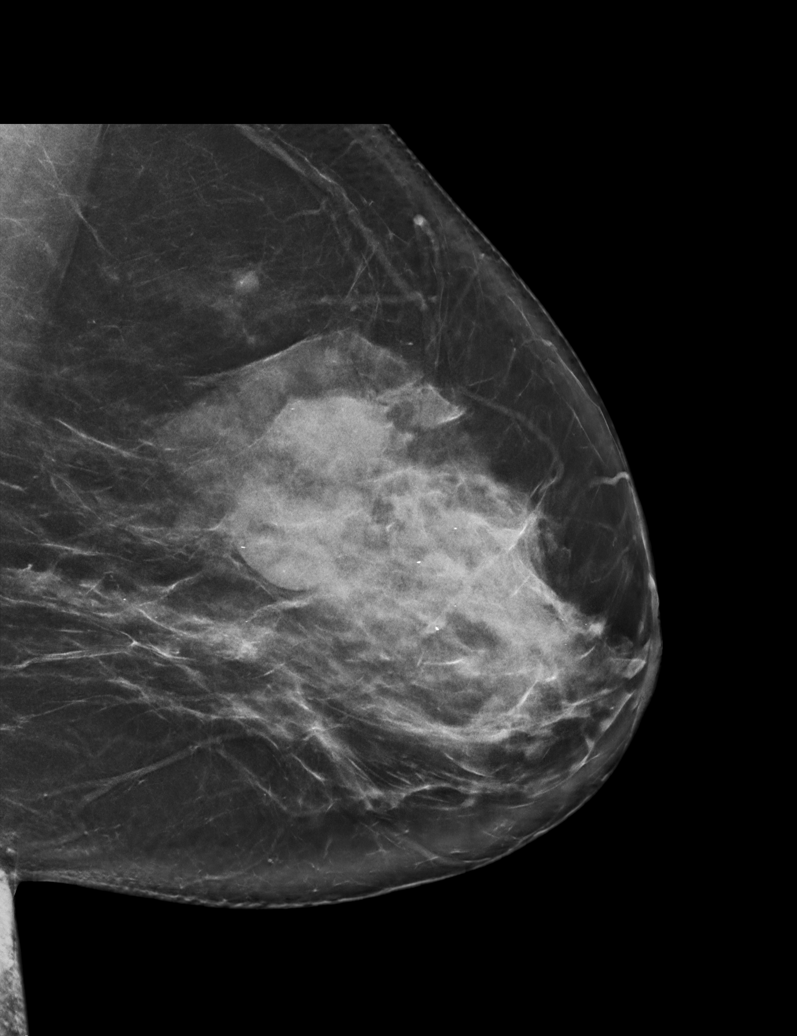

[R MLO tomo · tomo slice 43/86.0]
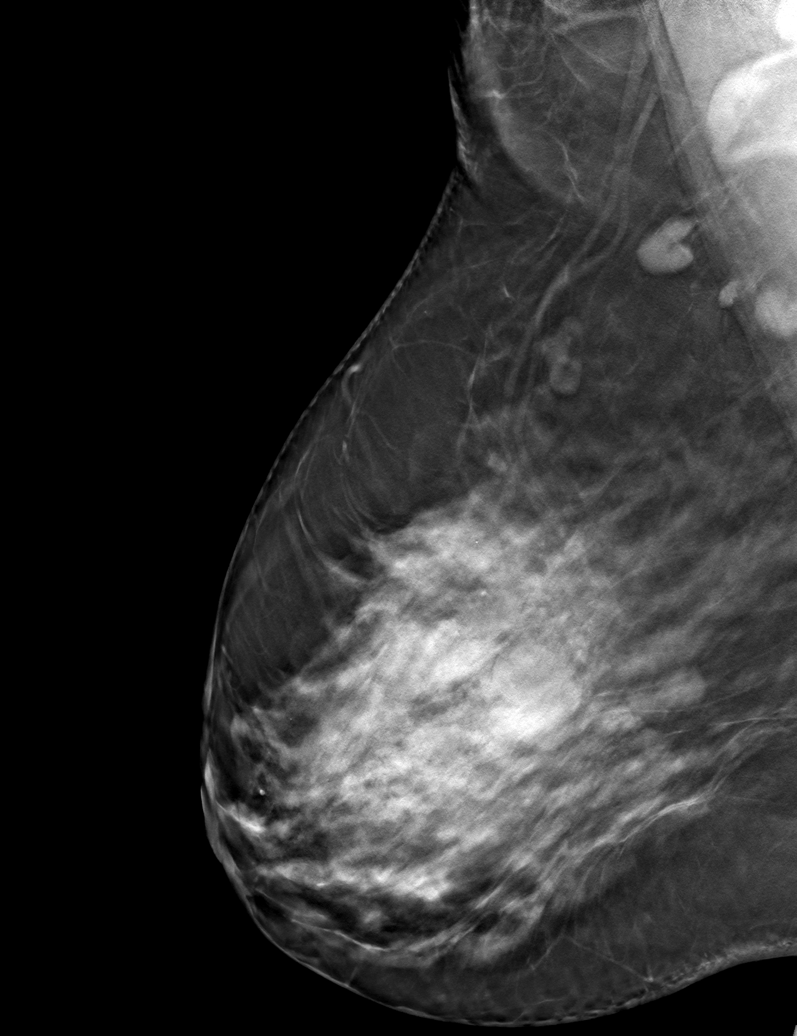

[6 of 30 positions shown; findings below may reference images not displayed]

ACR Breast Density Category d: The breast tissue is extremely dense,
which lowers the sensitivity of mammography.
FINDINGS: Waxing and waning circumscribed, equal density masses are noted in
the bilateral breasts. A few low lying right axillary lymph nodes
appear slightly prominent when compared to prior studies.

No additional suspicious findings are identified in either breast.

Mammographic images were processed with CAD.

Targeted ultrasound is performed, showing oval and round
circumscribed anechoic masses within the bilateral breasts
consistent with benign simple cysts. The largest measures 1.9 x
x 1.5 cm at the 9 o'clock position 5 cm from the nipple on the right
and 2.2 x 1.9 x 1.4 cm at the 2 o'clock position 5 cm from the
nipple on the left.

Evaluation of the right axilla demonstrates 2 prominent lymph nodes
demonstrating up to 11 mm of cortical thickness.
IMPRESSION: 1. Benign simple cysts bilaterally. No further imaging follow-up
required.
2. No mammographic evidence of malignancy.
3. Probably benign, prominent right axillary lymph nodes which may
be related to the patient's recent COVID vaccination. Recommendation
is for short-term imaging follow-up to ensure
resolution/improvement.

RECOMMENDATION:
Right axillary ultrasound in 4 weeks.

I have discussed the findings and recommendations with the patient.
If applicable, a reminder letter will be sent to the patient
regarding the next appointment.

BI-RADS CATEGORY  3: Probably benign.

## 2021-11-11 ENCOUNTER — Other Ambulatory Visit: Payer: Self-pay | Admitting: Family Medicine

## 2021-11-11 DIAGNOSIS — F988 Other specified behavioral and emotional disorders with onset usually occurring in childhood and adolescence: Secondary | ICD-10-CM

## 2021-11-11 DIAGNOSIS — H6692 Otitis media, unspecified, left ear: Secondary | ICD-10-CM

## 2021-11-12 ENCOUNTER — Ambulatory Visit
Admission: RE | Admit: 2021-11-12 | Discharge: 2021-11-12 | Disposition: A | Discharge status: Home / Self Care | Payer: Managed Care, Other (non HMO) | Source: Ambulatory Visit

## 2021-11-12 DIAGNOSIS — Z1231 Encounter for screening mammogram for malignant neoplasm of breast: Secondary | ICD-10-CM

## 2021-11-12 NOTE — Telephone Encounter (Signed)
Requesting: Adderall '20mg'$   Contract: 06/02/21 UDS: 06/02/21 Last Visit: 07/16/21 Next Visit: None Last Refill: 09/08/21 #60 and 0RF  Please Advise

## 2021-11-13 ENCOUNTER — Encounter: Payer: Self-pay | Admitting: Family Medicine

## 2021-11-13 ENCOUNTER — Other Ambulatory Visit: Payer: Self-pay | Admitting: Family Medicine

## 2021-11-13 DIAGNOSIS — H6692 Otitis media, unspecified, left ear: Secondary | ICD-10-CM

## 2021-11-13 DIAGNOSIS — F988 Other specified behavioral and emotional disorders with onset usually occurring in childhood and adolescence: Secondary | ICD-10-CM

## 2021-11-13 MED ORDER — FLUCONAZOLE 150 MG PO TABS: ORAL_TABLET | ORAL | 0 refills | Status: DC

## 2021-11-13 MED ORDER — AMPHETAMINE-DEXTROAMPHETAMINE 20 MG PO TABS: 20.0000 mg | ORAL_TABLET | Freq: Two times a day (BID) | ORAL | 0 refills | Status: DC

## 2021-11-25 ENCOUNTER — Encounter (INDEPENDENT_AMBULATORY_CARE_PROVIDER_SITE_OTHER): Payer: Self-pay

## 2021-12-22 ENCOUNTER — Ambulatory Visit (INDEPENDENT_AMBULATORY_CARE_PROVIDER_SITE_OTHER): Payer: Managed Care, Other (non HMO) | Admitting: Family Medicine

## 2021-12-22 ENCOUNTER — Encounter: Payer: Self-pay | Admitting: Family Medicine

## 2021-12-22 VITALS — BP 114/82 | HR 82 | Temp 98.6°F | Resp 18 | Ht 67.0 in | Wt 225.2 lb

## 2021-12-22 DIAGNOSIS — E559 Vitamin D deficiency, unspecified: Secondary | ICD-10-CM

## 2021-12-22 DIAGNOSIS — E785 Hyperlipidemia, unspecified: Secondary | ICD-10-CM

## 2021-12-22 DIAGNOSIS — F3289 Other specified depressive episodes: Secondary | ICD-10-CM

## 2021-12-22 DIAGNOSIS — Z79899 Other long term (current) drug therapy: Secondary | ICD-10-CM

## 2021-12-22 DIAGNOSIS — R0683 Snoring: Secondary | ICD-10-CM

## 2021-12-22 DIAGNOSIS — F988 Other specified behavioral and emotional disorders with onset usually occurring in childhood and adolescence: Secondary | ICD-10-CM

## 2021-12-22 DIAGNOSIS — Z0000 Encounter for general adult medical examination without abnormal findings: Secondary | ICD-10-CM

## 2021-12-22 DIAGNOSIS — R1013 Epigastric pain: Secondary | ICD-10-CM

## 2021-12-22 DIAGNOSIS — E782 Mixed hyperlipidemia: Secondary | ICD-10-CM

## 2021-12-22 DIAGNOSIS — M67432 Ganglion, left wrist: Secondary | ICD-10-CM

## 2021-12-22 DIAGNOSIS — R5383 Other fatigue: Secondary | ICD-10-CM

## 2021-12-22 LAB — CBC WITH DIFFERENTIAL/PLATELET
Basophils Absolute: 0 10*3/uL (ref 0.0–0.1)
Basophils Relative: 0.3 % (ref 0.0–3.0)
Eosinophils Absolute: 0.1 10*3/uL (ref 0.0–0.7)
Eosinophils Relative: 2.3 % (ref 0.0–5.0)
HCT: 39.3 % (ref 36.0–46.0)
Hemoglobin: 12.9 g/dL (ref 12.0–15.0)
Lymphocytes Relative: 28.4 % (ref 12.0–46.0)
Lymphs Abs: 1.7 10*3/uL (ref 0.7–4.0)
MCHC: 32.9 g/dL (ref 30.0–36.0)
MCV: 86 fl (ref 78.0–100.0)
Monocytes Absolute: 0.4 10*3/uL (ref 0.1–1.0)
Monocytes Relative: 6.4 % (ref 3.0–12.0)
Neutro Abs: 3.7 10*3/uL (ref 1.4–7.7)
Neutrophils Relative %: 62.6 % (ref 43.0–77.0)
Platelets: 283 10*3/uL (ref 150.0–400.0)
RBC: 4.57 Mil/uL (ref 3.87–5.11)
RDW: 12.8 % (ref 11.5–15.5)
WBC: 5.9 10*3/uL (ref 4.0–10.5)

## 2021-12-22 LAB — LIPID PANEL
Cholesterol: 213 mg/dL — ABNORMAL HIGH (ref 0–200)
HDL: 43.2 mg/dL (ref >39.00)
LDL Cholesterol: 153 mg/dL — ABNORMAL HIGH (ref 0–99)
NonHDL: 170.29
Total CHOL/HDL Ratio: 5
Triglycerides: 88 mg/dL (ref 0.0–149.0)
VLDL: 17.6 mg/dL (ref 0.0–40.0)

## 2021-12-22 LAB — COMPREHENSIVE METABOLIC PANEL
ALT: 12 U/L (ref 0–35)
AST: 17 U/L (ref 0–37)
Albumin: 4.2 g/dL (ref 3.5–5.2)
Alkaline Phosphatase: 106 U/L (ref 39–117)
BUN: 8 mg/dL (ref 6–23)
CO2: 30 mEq/L (ref 19–32)
Calcium: 9.6 mg/dL (ref 8.4–10.5)
Chloride: 98 mEq/L (ref 96–112)
Creatinine, Ser: 0.8 mg/dL (ref 0.40–1.20)
GFR: 85.16 mL/min (ref >60.00)
Glucose, Bld: 83 mg/dL (ref 70–99)
Potassium: 4.2 mEq/L (ref 3.5–5.1)
Sodium: 136 mEq/L (ref 135–145)
Total Bilirubin: 0.5 mg/dL (ref 0.2–1.2)
Total Protein: 7.2 g/dL (ref 6.0–8.3)

## 2021-12-22 LAB — VITAMIN B12: Vitamin B-12: 551 pg/mL (ref 211–911)

## 2021-12-22 LAB — TSH: TSH: 1.38 u[IU]/mL (ref 0.35–5.50)

## 2021-12-22 LAB — VITAMIN D 25 HYDROXY (VIT D DEFICIENCY, FRACTURES): VITD: 18.67 ng/mL — ABNORMAL LOW (ref 30.00–100.00)

## 2021-12-22 NOTE — Assessment & Plan Note (Signed)
Refer to hand surgeon 

## 2021-12-22 NOTE — Assessment & Plan Note (Signed)
Check labs 

## 2021-12-22 NOTE — Addendum Note (Signed)
Addended by: Sanda Linger on: 12/22/2021 02:56 PM   Modules accepted: Orders

## 2021-12-22 NOTE — Patient Instructions (Signed)
Preventive Care 51-51 Years Old, Female Preventive care refers to lifestyle choices and visits with your health care provider that can promote health and wellness. Preventive care visits are also called wellness exams. What can I expect for my preventive care visit? Counseling Your health care provider may ask you questions about your: Medical history, including: Past medical problems. Family medical history. Pregnancy history. Current health, including: Menstrual cycle. Method of birth control. Emotional well-being. Home life and relationship well-being. Sexual activity and sexual health. Lifestyle, including: Alcohol, nicotine or tobacco, and drug use. Access to firearms. Diet, exercise, and sleep habits. Work and work environment. Sunscreen use. Safety issues such as seatbelt and bike helmet use. Physical exam Your health care provider will check your: Height and weight. These may be used to calculate your BMI (body mass index). BMI is a measurement that tells if you are at a healthy weight. Waist circumference. This measures the distance around your waistline. This measurement also tells if you are at a healthy weight and may help predict your risk of certain diseases, such as type 2 diabetes and high blood pressure. Heart rate and blood pressure. Body temperature. Skin for abnormal spots. What immunizations do I need?  Vaccines are usually given at various ages, according to a schedule. Your health care provider will recommend vaccines for you based on your age, medical history, and lifestyle or other factors, such as travel or where you work. What tests do I need? Screening Your health care provider may recommend screening tests for certain conditions. This may include: Lipid and cholesterol levels. Diabetes screening. This is done by checking your blood sugar (glucose) after you have not eaten for a while (fasting). Pelvic exam and Pap test. Hepatitis B test. Hepatitis C  test. HIV (human immunodeficiency virus) test. STI (sexually transmitted infection) testing, if you are at risk. Lung cancer screening. Colorectal cancer screening. Mammogram. Talk with your health care provider about when you should start having regular mammograms. This may depend on whether you have a family history of breast cancer. BRCA-related cancer screening. This may be done if you have a family history of breast, ovarian, tubal, or peritoneal cancers. Bone density scan. This is done to screen for osteoporosis. Talk with your health care provider about your test results, treatment options, and if necessary, the need for more tests. Follow these instructions at home: Eating and drinking  Eat a diet that includes fresh fruits and vegetables, whole grains, lean protein, and low-fat dairy products. Take vitamin and mineral supplements as recommended by your health care provider. Do not drink alcohol if: Your health care provider tells you not to drink. You are pregnant, may be pregnant, or are planning to become pregnant. If you drink alcohol: Limit how much you have to 0-1 drink a day. Know how much alcohol is in your drink. In the [place] one drink equals one 12 oz bottle of beer (355 mL), one 5 oz glass of wine (148 mL), or one 1 oz glass of hard liquor (44 mL). Lifestyle Brush your teeth every morning and night with fluoride toothpaste. Floss one time each day. Exercise for at least 30 minutes 5 or more days each week. Do not use any products that contain nicotine or tobacco. These products include cigarettes, chewing tobacco, and vaping devices, such as e-cigarettes. If you need help quitting, ask your health care provider. Do not use drugs. If you are sexually active, practice safe sex. Use a condom or other form of protection to   prevent STIs. If you do not wish to become pregnant, use a form of birth control. If you plan to become pregnant, see your health care provider for a  prepregnancy visit. Take aspirin only as told by your health care provider. Make sure that you understand how much to take and what form to take. Work with your health care provider to find out whether it is safe and beneficial for you to take aspirin daily. Find healthy ways to manage stress, such as: Meditation, yoga, or listening to music. Journaling. Talking to a trusted person. Spending time with friends and family. Minimize exposure to UV radiation to reduce your risk of skin cancer. Safety Always wear your seat belt while driving or riding in a vehicle. Do not drive: If you have been drinking alcohol. Do not ride with someone who has been drinking. When you are tired or distracted. While texting. If you have been using any mind-altering substances or drugs. Wear a helmet and other protective equipment during sports activities. If you have firearms in your house, make sure you follow all gun safety procedures. Seek help if you have been physically or sexually abused. What's next? Visit your health care provider once a year for an annual wellness visit. Ask your health care provider how often you should have your eyes and teeth checked. Stay up to date on all vaccines. This information is not intended to replace advice given to you by your health care provider. Make sure you discuss any questions you have with your health care provider. Document Revised: 10/01/2020 Document Reviewed: 10/01/2020 Elsevier Patient Education  [place].

## 2021-12-22 NOTE — Assessment & Plan Note (Signed)
Encourage heart healthy diet such as MIND or DASH diet, increase exercise, avoid trans fats, simple carbohydrates and processed foods, consider a krill or fish or flaxseed oil cap daily.  °

## 2021-12-22 NOTE — Addendum Note (Signed)
Addended by: Sanda Linger on: 12/22/2021 04:25 PM   Modules accepted: Orders

## 2021-12-22 NOTE — Progress Notes (Signed)
Subjective:   By signing my name below, I, Shehryar Baig, attest that this documentation has been prepared under the direction and in the presence of Dr. Roma Schanz, DO. 12/22/2021    Patient ID: Cheryl Patterson, female    DOB: [birth year], 51 y.o.   MRN: [id number]  Chief Complaint  Patient presents with   Annual Exam    Pt states fasting     HPI Patient is in today for a comprehensive physical exam.   She complains of low energy levels and fatigue. She snores whiles sleeping.  She also reports a lump on her left wrist. She has occasionally numbness since she discovered the lump.  She has occasional abdominal pain. She thought it was from bloating and gas. She has not tried taking Rolaids and Tums to manage her symptoms.  She denies having any fever, new moles, congestion, sore throat, new muscle pain, new joint pain, chest pain, cough, SOB, wheezing, n/v/d, constipation, blood in stool, dysuria, frequency, hematuria, or headaches at this time. She has no changes to her family medical history.    Past Medical History:  Diagnosis Date   ADD (attention deficit disorder)    Allergy    Anxiety    Asthma    per pt- bronchitis brings on the symptoms   Bipolar depression ([place])    Chronic headaches    Chronic kidney disease    kidney stones   Constipation    Depression    [facility]---suicidal ideation   Dyspnea    GERD (gastroesophageal reflux disease)    Hyperlipidemia    no meds taken as of 04-20-16   IBS (irritable bowel syndrome)    Joint pain    Kidney infection    Lactose intolerance    Prediabetes    Ruptured disc, thoracic    Sepsis ([place])    2014-  d/t urinary infection   UTI (urinary tract infection)    Vitamin D deficiency     Past Surgical History:  Procedure Laterality Date   BREAST BIOPSY Right 2021   BREAST EXCISIONAL BIOPSY Left    as teenager   BREAST SURGERY     left breast lump removed   COLONOSCOPY     CYSTOSCOPY N/A  03/18/2015   Procedure: CYSTOSCOPY;  Surgeon: Terrance Mass, MD;  Location: [facility];  Service: Gynecology;  Laterality: N/A;   KIDNEY STONE SURGERY     LAPAROSCOPIC BILATERAL SALPINGECTOMY Bilateral 03/18/2015   Procedure: LAPAROSCOPIC BILATERAL SALPINGECTOMY;  Surgeon: Terrance Mass, MD;  Location: [facility];  Service: Gynecology;  Laterality: Bilateral;   LAPAROSCOPIC VAGINAL HYSTERECTOMY WITH SALPINGO OOPHORECTOMY N/A 03/18/2015   Procedure: LAPAROSCOPIC ASSISTED VAGINAL HYSTERECTOMY WITH SALPINGO OOPHORECTOMY;  Surgeon: Terrance Mass, MD;  Location: [facility];  Service: Gynecology;  Laterality: N/A;   LEEP  2005   LIPOMA EXCISION     MOUTH SURGERY     OTHER SURGICAL HISTORY     RENAL STONE REMOVAL   TUBAL LIGATION      Family History  Problem Relation Age of Onset   Colon cancer Mother 37   Cancer Mother 62       colon   Anxiety disorder Mother    Hypertension Father    Hyperlipidemia Father    Colon cancer Maternal Aunt    Cancer Maternal Aunt        colon   Uterine cancer Maternal Aunt    Brain cancer Maternal Aunt    Cancer Maternal Uncle  COLON   Breast cancer Paternal Aunt    Cancer Paternal Aunt        breast   Colon cancer Paternal Uncle    Diabetes Maternal Grandfather    Stroke Paternal Grandmother    Hypertension Paternal Grandmother    Heart disease Paternal Grandmother    Hypertension Other    Esophageal cancer Neg Hx    Rectal cancer Neg Hx    Stomach cancer Neg Hx     Social History   Socioeconomic History   Marital status: Married    Spouse name: Tim   Number of children: 2   Years of education: Not on file   Highest education level: Not on file  Occupational History   Occupation: Lobbyist: [employer]  Tobacco Use   Smoking status: Never    Passive exposure: Never   Smokeless tobacco: Never  Vaping Use   Vaping Use: Never used  Substance and Sexual Activity   Alcohol use: No    Alcohol/week:  0.0 standard drinks of alcohol   Drug use: No   Sexual activity: Yes    Partners: Male    Birth control/protection: None  Other Topics Concern   Not on file  Social History Narrative   Exercise--walk qd                    Video--  rare   Social Determinants of Health   Financial Resource Strain: Not on file  Food Insecurity: Not on file  Transportation Needs: Not on file  Physical Activity: Not on file  Stress: Not on file  Social Connections: Not on file  Intimate Partner Violence: Not on file    Outpatient Medications Prior to Visit  Medication Sig Dispense Refill   albuterol (VENTOLIN HFA) 108 (90 Base) MCG/ACT inhaler Inhale 2 puffs into the lungs every 6 (six) hours as needed for wheezing or shortness of breath. 18 g 5   amphetamine-dextroamphetamine (ADDERALL) 20 MG tablet Take 1 tablet (20 mg total) by mouth 2 (two) times daily. 60 tablet 0   FLUoxetine (PROZAC) 20 MG tablet Take 1 tablet (20 mg total) by mouth daily. 90 tablet 3   fluticasone (FLONASE) 50 MCG/ACT nasal spray Place 2 sprays into both nostrils daily. 16 g 6   pantoprazole (PROTONIX) 40 MG tablet TAKE 1 TABLET BY MOUTH EVERY DAY 90 tablet 1   rosuvastatin (CRESTOR) 10 MG tablet TAKE 1 TABLET BY MOUTH EVERY DAY 90 tablet 1   Vitamin D, Ergocalciferol, (DRISDOL) 1.25 MG (50000 UNIT) CAPS capsule Take 1 capsule (50,000 Units total) by mouth every 7 (seven) days. 12 capsule 3   cefdinir (OMNICEF) 300 MG capsule Take 1 capsule (300 mg total) by mouth 2 (two) times daily. (Patient not taking: Reported on 12/22/2021) 20 capsule 0   fluconazole (DIFLUCAN) 150 MG tablet 1 po qd x1, may repeat in 3 days prn (Patient not taking: Reported on 12/22/2021) 2 tablet 0   predniSONE (DELTASONE) 10 MG tablet TAKE 3 TABLETS PO QD FOR 3 DAYS THEN TAKE 2 TABLETS PO QD FOR 3 DAYS THEN TAKE 1 TABLET PO QD FOR 3 DAYS THEN TAKE 1/2 TAB PO QD FOR 3 DAYS (Patient not taking: Reported on 12/22/2021) 20 tablet 0   Facility-Administered  Medications Prior to Visit  Medication Dose Route Frequency Provider Last Rate Last Admin   0.9 %  sodium chloride infusion  500 mL Intravenous Continuous Milus Banister, MD  Allergies  Allergen Reactions   Codeine     Itchy throat, whelps on skin   Other Swelling and Rash    tomatoes    Review of Systems  Constitutional:  Positive for malaise/fatigue. Negative for fever.  HENT:  Negative for congestion, sinus pain and sore throat.   Respiratory:  Negative for cough, shortness of breath and wheezing.   Cardiovascular:  Negative for chest pain and palpitations.  Gastrointestinal:  Positive for abdominal pain. Negative for constipation, diarrhea, nausea and vomiting.  Genitourinary:  Negative for dysuria, frequency and hematuria.  Musculoskeletal:        (+)cyst on left wrist  Skin:        (-)New moles  Neurological:  Positive for tingling (occasionally in left arm). Negative for headaches.       Objective:    Physical Exam Constitutional:      General: She is not in acute distress.    Appearance: Normal appearance. She is not ill-appearing.  HENT:     Head: Normocephalic and atraumatic.     Right Ear: Tympanic membrane, ear canal and external ear normal.     Left Ear: Tympanic membrane, ear canal and external ear normal.  Eyes:     Extraocular Movements: Extraocular movements intact.     Pupils: Pupils are equal, round, and reactive to light.  Cardiovascular:     Rate and Rhythm: Normal rate and regular rhythm.     Heart sounds: Normal heart sounds. No murmur heard.    No gallop.  Pulmonary:     Effort: Pulmonary effort is normal. No respiratory distress.     Breath sounds: Normal breath sounds. No wheezing or rales.  Abdominal:     General: Bowel sounds are normal. There is no distension.     Palpations: Abdomen is soft.     Tenderness: There is abdominal tenderness in the epigastric area. There is no guarding.  Musculoskeletal:     Comments: Ganglion cyst  on left wrist  Skin:    General: Skin is warm and dry.  Neurological:     Mental Status: She is alert and oriented to person, place, and time.  Psychiatric:        Judgment: Judgment normal.     BP 114/82 (BP Location: Left Arm, Patient Position: Sitting, Cuff Size: Large)   Pulse 82   Temp 98.6 F (37 C) (Oral)   Resp 18   Ht '5\' 7"'$  (1.702 m)   Wt 225 lb 3.2 oz (102.2 kg)   LMP 12/02/2014   SpO2 97%   BMI 35.27 kg/m  Wt Readings from Last 3 Encounters:  12/22/21 225 lb 3.2 oz (102.2 kg)  07/16/21 220 lb (99.8 kg)  06/02/21 217 lb 11.2 oz (98.7 kg)    Diabetic Foot Exam - Simple   No data filed    Lab Results  Component Value Date   WBC 5.5 06/02/2021   HGB 13.1 06/02/2021   HCT 40.2 06/02/2021   PLT 296.0 06/02/2021   GLUCOSE 89 06/02/2021   CHOL 193 06/02/2021   TRIG 76.0 06/02/2021   HDL 45.40 06/02/2021   LDLDIRECT 144.3 06/25/2010   LDLCALC 132 (H) 06/02/2021   ALT 14 06/02/2021   AST 17 06/02/2021   NA 139 06/02/2021   K 4.2 06/02/2021   CL 100 06/02/2021   CREATININE 0.93 06/02/2021   BUN 13 06/02/2021   CO2 34 (H) 06/02/2021   TSH 1.39 12/02/2020   HGBA1C 5.9 04/30/2019  Lab Results  Component Value Date   TSH 1.39 12/02/2020   Lab Results  Component Value Date   WBC 5.5 06/02/2021   HGB 13.1 06/02/2021   HCT 40.2 06/02/2021   MCV 86.5 06/02/2021   PLT 296.0 06/02/2021   Lab Results  Component Value Date   NA 139 06/02/2021   K 4.2 06/02/2021   CO2 34 (H) 06/02/2021   GLUCOSE 89 06/02/2021   BUN 13 06/02/2021   CREATININE 0.93 06/02/2021   BILITOT 0.3 06/02/2021   ALKPHOS 90 06/02/2021   AST 17 06/02/2021   ALT 14 06/02/2021   PROT 7.7 06/02/2021   ALBUMIN 4.4 06/02/2021   CALCIUM 9.7 06/02/2021   ANIONGAP 8 02/10/2021   GFR 71.36 06/02/2021   Lab Results  Component Value Date   CHOL 193 06/02/2021   Lab Results  Component Value Date   HDL 45.40 06/02/2021   Lab Results  Component Value Date   LDLCALC 132 (H)  06/02/2021   Lab Results  Component Value Date   TRIG 76.0 06/02/2021   Lab Results  Component Value Date   CHOLHDL 4 06/02/2021   Lab Results  Component Value Date   HGBA1C 5.9 04/30/2019   Mammogram- Last completed 11/16/2021. Results are normal. Repeat in 1 year.  Colonoscopy- Last completed 04/27/2016. Results showed: - The entire examined colon is normal on direct and retroflexion views. - No polyps or cancers. Repeat in 5 years.      Assessment & Plan:   Problem List Items Addressed This Visit       Unprioritized   Depression   Attention deficit disorder   Vitamin D deficiency    Check labs      Preventative health care - Primary    ghm utd Check labs      Relevant Orders   CBC with Differential/Platelet   Comprehensive metabolic panel   Lipid panel   TSH   Vitamin B12   VITAMIN D 25 Hydroxy (Vit-D Deficiency, Fractures)   Mixed hyperlipidemia    Encourage heart healthy diet such as MIND or DASH diet, increase exercise, avoid trans fats, simple carbohydrates and processed foods, consider a krill or fish or flaxseed oil cap daily.       Midepigastric pain    Refer to gi  Check labs  Pt has not bee taking her protonix       Relevant Orders   Ambulatory referral to Gastroenterology   H. pylori breath test   Ganglion cyst of dorsum of left wrist    Refer to hand surgeon      Relevant Orders   Ambulatory referral to Orthopedic Surgery   Dyspepsia   Relevant Orders   Ambulatory referral to Gastroenterology   H. pylori breath test   Other Visit Diagnoses     Hyperlipidemia, unspecified hyperlipidemia type       Relevant Orders   CBC with Differential/Platelet   Comprehensive metabolic panel   Lipid panel   TSH   Vitamin B12   VITAMIN D 25 Hydroxy (Vit-D Deficiency, Fractures)   Snoring       Relevant Orders   Ambulatory referral to Neurology   Other fatigue       Relevant Orders   Ambulatory referral to Neurology        No orders  of the defined types were placed in this encounter.   IAnn Held, DO, personally preformed the services described in this documentation.  All medical record  entries made by the scribe were at my direction and in my presence.  I have reviewed the chart and discharge instructions (if applicable) and agree that the record reflects my personal performance and is accurate and complete. 12/22/2021   I,Shehryar Baig,acting as a scribe for Ann Held, DO.,have documented all relevant documentation on the behalf of Ann Held, DO,as directed by  Ann Held, DO while in the presence of Ann Held, DO.   Ann Held, DO

## 2021-12-22 NOTE — Assessment & Plan Note (Signed)
ghm utd Check labs  

## 2021-12-22 NOTE — Addendum Note (Signed)
Addended by: Roma Schanz R on: 12/22/2021 02:53 PM   Modules accepted: Orders

## 2021-12-22 NOTE — Assessment & Plan Note (Signed)
Refer to gi  Check labs  Pt has not bee taking her protonix

## 2021-12-23 ENCOUNTER — Other Ambulatory Visit: Payer: Self-pay | Admitting: Family Medicine

## 2021-12-23 DIAGNOSIS — E785 Hyperlipidemia, unspecified: Secondary | ICD-10-CM

## 2021-12-23 LAB — H. PYLORI BREATH TEST: H. pylori Breath Test: NOT DETECTED

## 2021-12-25 LAB — DRUG MONITORING PANEL 376104, URINE
Amphetamine: 4572 ng/mL — ABNORMAL HIGH (ref <250)
Amphetamines: POSITIVE ng/mL — AB (ref <500)
Barbiturates: NEGATIVE ng/mL (ref <300)
Benzodiazepines: NEGATIVE ng/mL (ref <100)
Cocaine Metabolite: NEGATIVE ng/mL (ref <150)
Desmethyltramadol: NEGATIVE ng/mL (ref <100)
Methamphetamine: NEGATIVE ng/mL (ref <250)
Opiates: NEGATIVE ng/mL (ref <100)
Oxycodone: NEGATIVE ng/mL (ref <100)
Tramadol: NEGATIVE ng/mL (ref <100)

## 2021-12-25 LAB — DM TEMPLATE

## 2021-12-27 ENCOUNTER — Encounter: Payer: Self-pay | Admitting: Family Medicine

## 2021-12-27 DIAGNOSIS — E559 Vitamin D deficiency, unspecified: Secondary | ICD-10-CM

## 2021-12-28 MED ORDER — VITAMIN D (ERGOCALCIFEROL) 1.25 MG (50000 UNIT) PO CAPS
50000.0000 [IU] | ORAL_CAPSULE | ORAL | 0 refills | Status: DC
Start: 2021-12-28 — End: 2022-03-17

## 2021-12-28 MED ORDER — ROSUVASTATIN CALCIUM 20 MG PO TABS: 20.0000 mg | ORAL_TABLET | Freq: Every day | ORAL | 2 refills | Status: DC

## 2022-01-19 ENCOUNTER — Other Ambulatory Visit: Payer: Self-pay | Admitting: Family Medicine

## 2022-01-19 DIAGNOSIS — F988 Other specified behavioral and emotional disorders with onset usually occurring in childhood and adolescence: Secondary | ICD-10-CM

## 2022-01-20 MED ORDER — AMPHETAMINE-DEXTROAMPHETAMINE 20 MG PO TABS: 20.0000 mg | ORAL_TABLET | Freq: Two times a day (BID) | ORAL | 0 refills | Status: DC

## 2022-01-20 NOTE — Telephone Encounter (Signed)
Requesting: Adderall '20mg'$   Contract: 12/22/21 UDS: 12/22/21 Last Visit: 12/22/21 Next Visit: None Last Refill: 11/13/21 #60 and 0RF   Please Advise

## 2022-01-21 IMAGING — US US AXILLARY NODE CORE BIOPSY RIGHT
1 series · 11 of 11 positions shown · non-contrast
Comparison: Previous exam(s).
COMPARISON: Previous exam(s).

Addendum:
CLINICAL DATA: 49-year-old female presenting for ultrasound-guided
biopsy of a right axillary lymph node.

EXAM:
US AXILLARY NODE CORE BIOPSY RIGHT

[Series 1: us axillary node core biopsy right · 0.09mm/px · 11 of 11 slices shown]
[im 1/11]
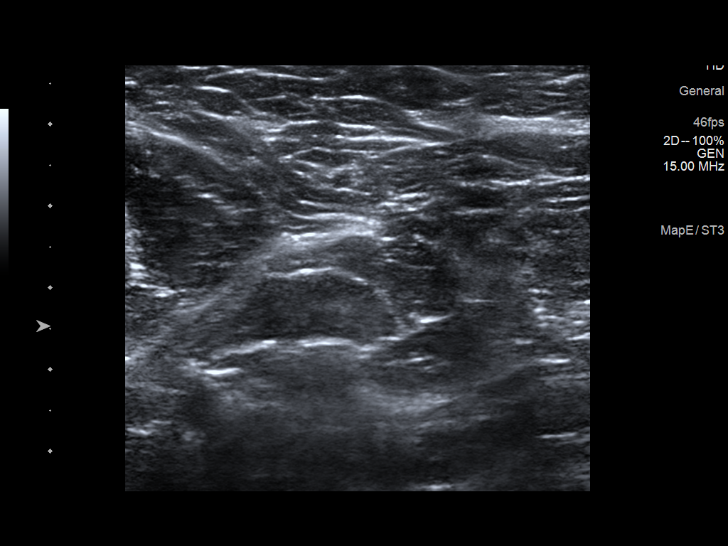
[im 2/11]
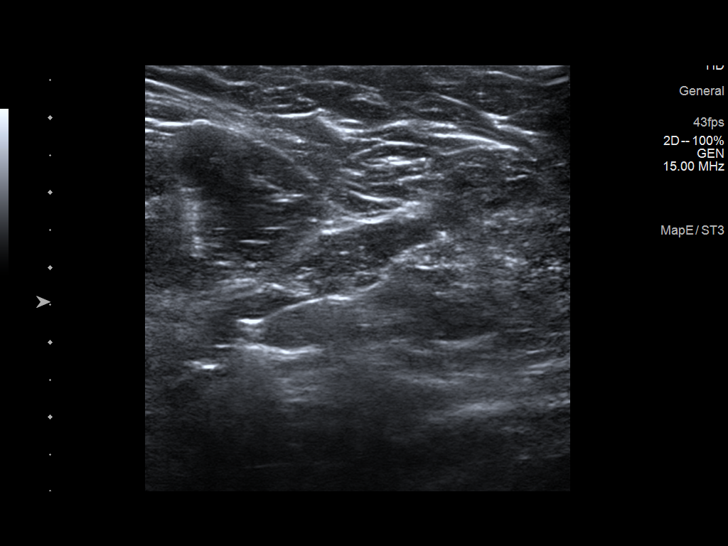
[im 3/11]
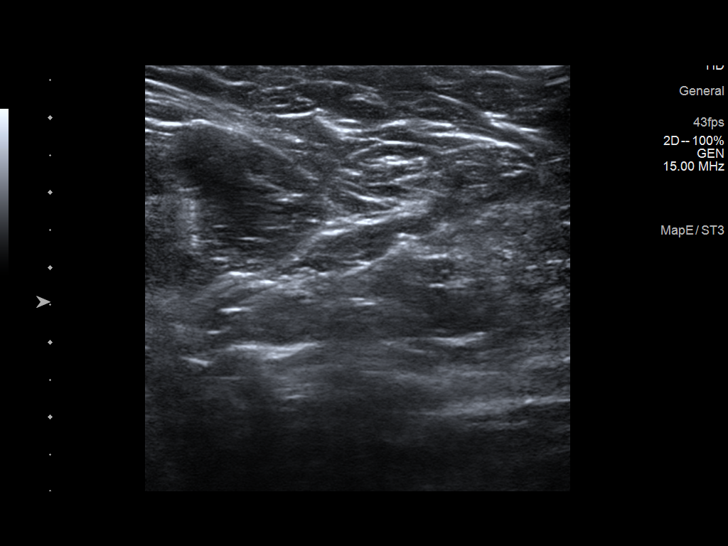
[im 4/11]
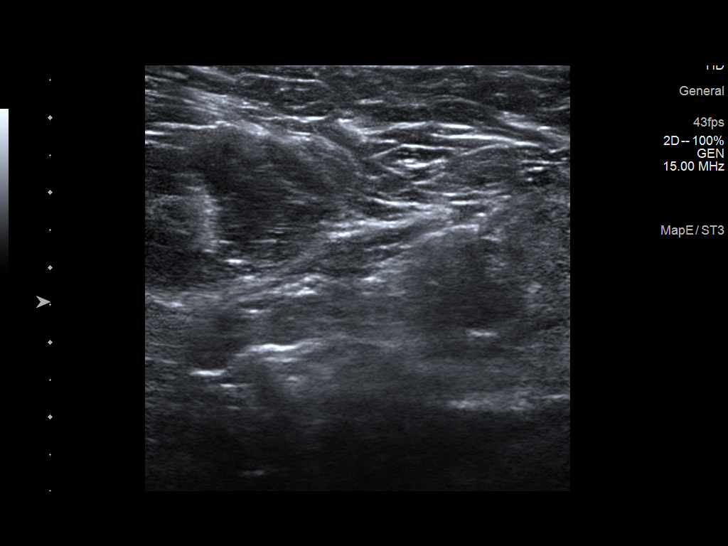
[im 5/11]
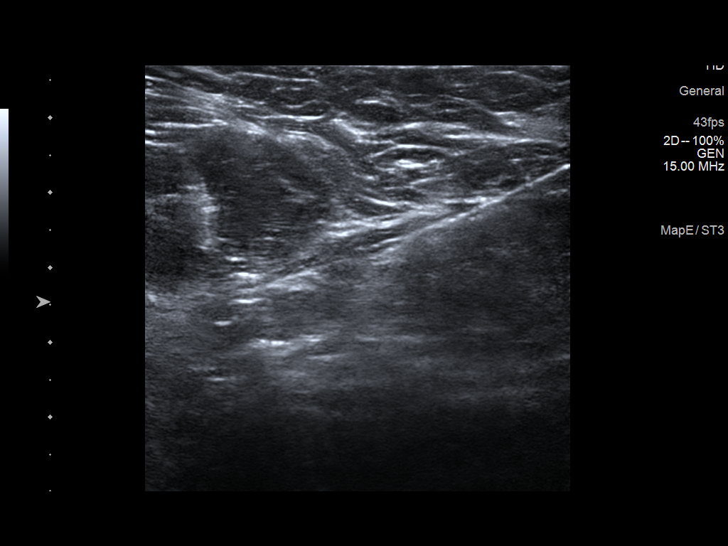
[im 6/11]
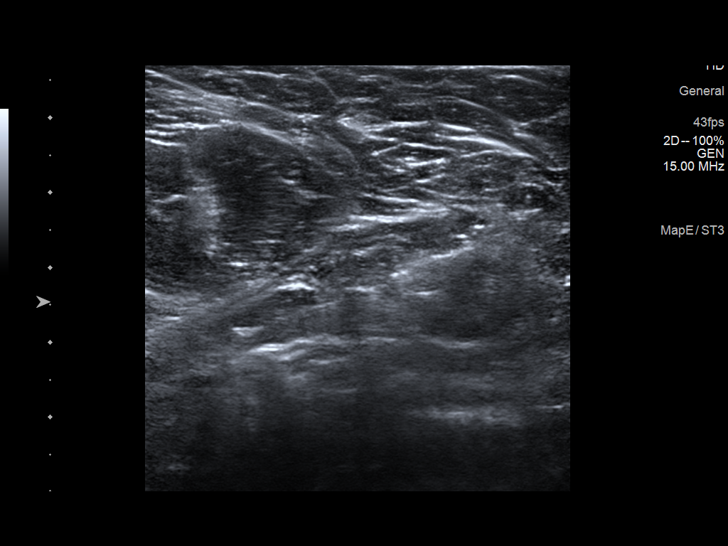
[im 7/11]
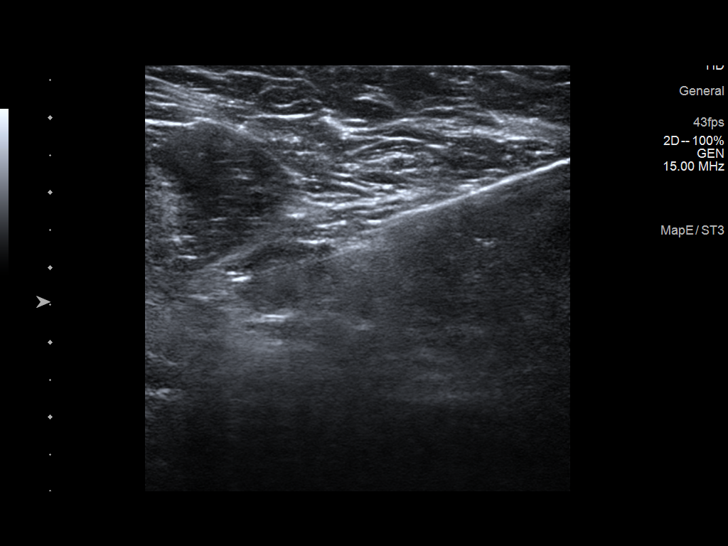
[im 8/11]
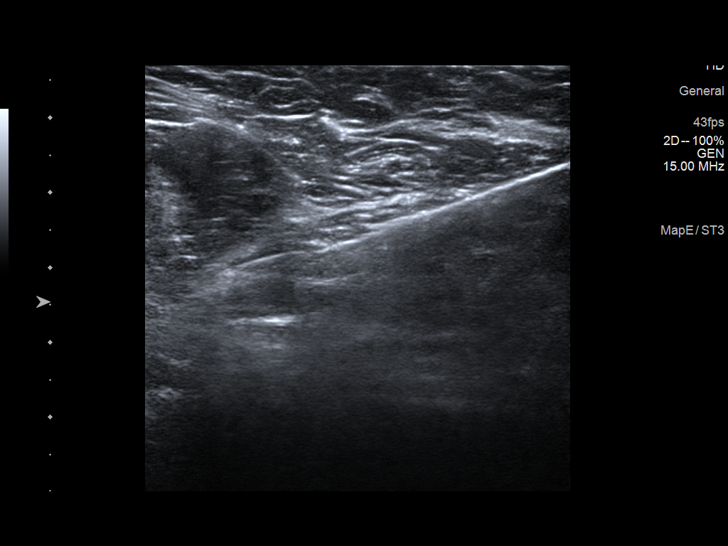
[im 9/11]
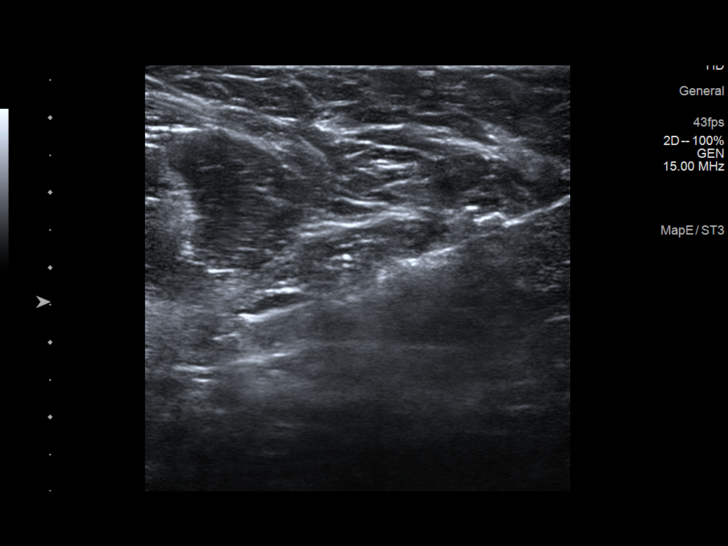
[im 10/11]
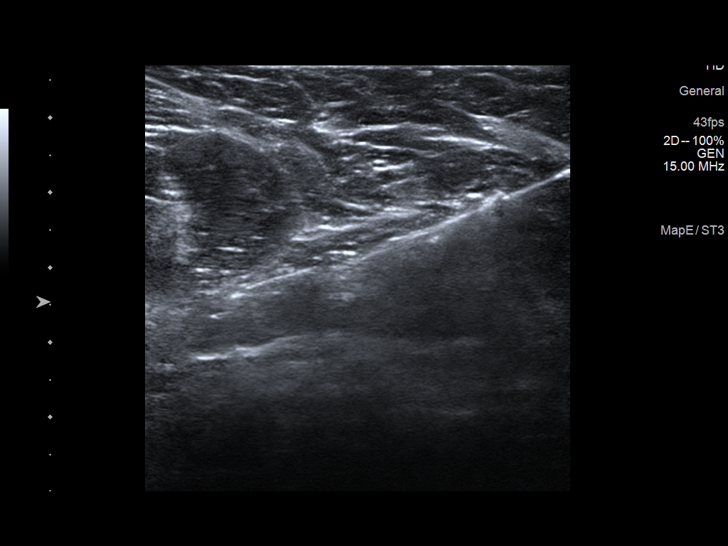
[im 11/11]
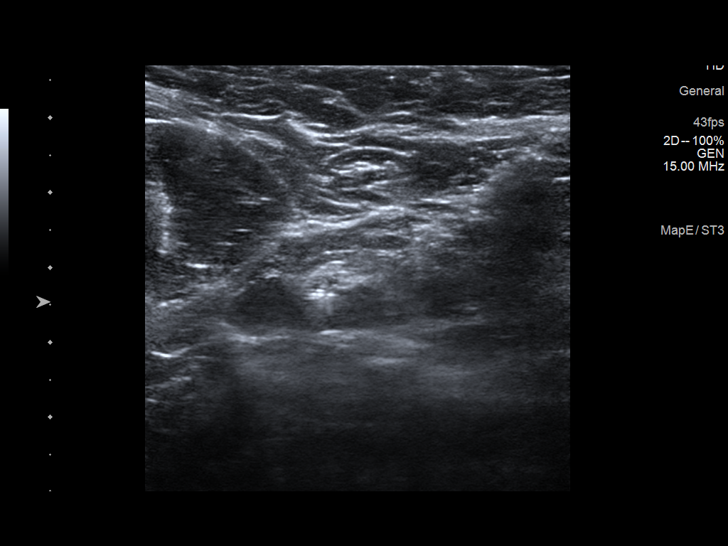

[11 of 11 positions shown; findings below may reference images not displayed]



Using sterile technique and 1% Lidocaine as local anesthetic, under
direct ultrasound visualization, a 14 gauge He device was
used to perform biopsy of a right axillary lymph node using a an
inferior approach. At the conclusion of the procedure Q shaped
tissue marker clip was deployed into the biopsy cavity. Follow up 2
view mammogram was performed and dictated separately.
IMPRESSION: Ultrasound guided biopsy of a right axillary lymph node. No apparent
complications.

ADDENDUM:
Pathology revealed LYMPH NODE WITH FOLLICULAR HYPERPLASIA of the
Right axilla. Flow cytometry performed on the sample did not
identify a monoclonal B-cell or phenotypically aberrant T-cell
population. This was found to be concordant by Dr. Tavo Sr.

Pathology results were discussed with the patient by telephone. The
patient reported doing well after the biopsy with tenderness at the
site. Post biopsy instructions and care were reviewed and questions
were answered. The patient was encouraged to call The [REDACTED] for any additional concerns. My direct phone
number was provided.

The patient was instructed to return for annual screening
mammography in September 2020. The patient was informed a reminder notice
would be sent regarding this appointment.

Pathology results reported by Ranee Liem, RN on 12/25/2019.



Using sterile technique and 1% Lidocaine as local anesthetic, under
direct ultrasound visualization, a 14 gauge He device was
used to perform biopsy of a right axillary lymph node using a an
inferior approach. At the conclusion of the procedure Q shaped
tissue marker clip was deployed into the biopsy cavity. Follow up 2
view mammogram was performed and dictated separately.
IMPRESSION: Ultrasound guided biopsy of a right axillary lymph node. No apparent
complications.

## 2022-03-09 ENCOUNTER — Encounter: Payer: Self-pay | Admitting: Nurse Practitioner

## 2022-03-15 ENCOUNTER — Encounter: Payer: Self-pay | Admitting: Family Medicine

## 2022-03-15 NOTE — Telephone Encounter (Signed)
Patient scheduled for appointment with Mackie Pai 03/16/22 at 340pm

## 2022-03-16 ENCOUNTER — Other Ambulatory Visit: Payer: Self-pay | Admitting: Family Medicine

## 2022-03-16 ENCOUNTER — Telehealth (INDEPENDENT_AMBULATORY_CARE_PROVIDER_SITE_OTHER): Payer: Managed Care, Other (non HMO) | Admitting: Medical

## 2022-03-16 ENCOUNTER — Other Ambulatory Visit: Payer: Self-pay | Admitting: Medical

## 2022-03-16 VITALS — BP 128/75 | HR 83 | Temp 98.7°F

## 2022-03-16 DIAGNOSIS — H6692 Otitis media, unspecified, left ear: Secondary | ICD-10-CM | POA: Diagnosis not present

## 2022-03-16 DIAGNOSIS — J4520 Mild intermittent asthma, uncomplicated: Secondary | ICD-10-CM | POA: Diagnosis not present

## 2022-03-16 DIAGNOSIS — R059 Cough, unspecified: Secondary | ICD-10-CM

## 2022-03-16 DIAGNOSIS — J40 Bronchitis, not specified as acute or chronic: Secondary | ICD-10-CM

## 2022-03-16 MED ORDER — BENZONATATE 100 MG PO CAPS: 100.0000 mg | ORAL_CAPSULE | Freq: Three times a day (TID) | ORAL | 0 refills | Status: DC | PRN

## 2022-03-16 MED ORDER — AZITHROMYCIN 250 MG PO TABS: ORAL_TABLET | ORAL | 0 refills | Status: AC

## 2022-03-16 MED ORDER — FLUTICASONE PROPIONATE 50 MCG/ACT NA SUSP: 2.0000 | Freq: Every day | NASAL | 6 refills | Status: DC

## 2022-03-16 MED ORDER — ALBUTEROL SULFATE HFA 108 (90 BASE) MCG/ACT IN AERS: 2.0000 | INHALATION_SPRAY | Freq: Four times a day (QID) | RESPIRATORY_TRACT | 5 refills | Status: DC | PRN

## 2022-03-16 NOTE — Patient Instructions (Signed)
Bronchitis type signs and symptoms presently.  Prescribing azithromycin antibiotic, benzonatate for cough, Flonase for nasal congestion and making albuterol available for wheezing.  If symptoms not significant improving by Friday did go ahead and place future chest x-ray.  Follow-up in 7 to 10 days or sooner if needed.

## 2022-03-16 NOTE — Progress Notes (Signed)
   Subjective:    Patient ID: Cheryl Patterson, female    DOB: [birth year], 51 y.o.   MRN: [id number]  HPI  Virtual Visit via Video Note  I connected with Cheryl Patterson on 03/16/22 at  3:40 PM EST by a video enabled telemedicine application and verified that I am speaking with the correct person using two identifiers.  Location: Patient: home Provider: office   I discussed the limitations of evaluation and management by telemedicine and the availability of in person appointments. The patient expressed understanding and agreed to proceed.  History of Present Illness: Pt states on wed she develops. She states slow onset. Her grandson had mild uri type symptoms. Pt states cough got worse over the weekend. Pt states her cough is both day at night. Pt states at first cough was dry. This morning feels like has mucus to cough.. Friday and Saturday had mild chills. Pt had low grade fever 100.2 on Saturday. Pt never tested for covid. She did have some bodyaches. Mild back achiness.   Pt has not had any wheezing. Hx of adult onset asthma 2011 per pt report.  Some anterior chest wall pain brief/transient after long couging episode.    Observations/Objective: General-no acute distress, pleasant, oriented. Lungs- on inspection lungs appear unlabored. Neck- no tracheal deviation or jvd on inspection. Neuro- gross motor function appears intact.    Assessment and Plan: Patient Instructions  Bronchitis type signs and symptoms presently.  Prescribing azithromycin antibiotic, benzonatate for cough, Flonase for nasal congestion and making albuterol available for wheezing.  If symptoms not significant improving by Friday did go ahead and place future chest x-ray.  Follow-up in 7 to 10 days or sooner if needed.   Mackie Pai, PA-C   Follow Up Instructions:    I discussed the assessment and treatment plan with the patient. The patient was provided an opportunity to ask questions and all were  answered. The patient agreed with the plan and demonstrated an understanding of the instructions.   The patient was advised to call back or seek an in-person evaluation if the symptoms worsen or if the condition fails to improve as anticipated.     Mackie Pai, PA-C   Review of Systems  Constitutional:  Negative for chills and fatigue.  HENT:  Positive for congestion and sinus pressure. Negative for postnasal drip.   Respiratory:  Positive for cough. Negative for chest tightness, shortness of breath and wheezing.   Cardiovascular:  Negative for chest pain and palpitations.  Gastrointestinal:  Negative for abdominal pain.  Genitourinary:  Negative for dyspareunia.  Musculoskeletal:  Negative for back pain and joint swelling.  Neurological:  Negative for dizziness and numbness.  Psychiatric/Behavioral:  Negative for behavioral problems and decreased concentration.        Objective:   Physical Exam        Assessment & Plan:

## 2022-03-18 ENCOUNTER — Telehealth: Payer: Managed Care, Other (non HMO) | Admitting: Family Medicine

## 2022-03-25 ENCOUNTER — Other Ambulatory Visit: Payer: Self-pay | Admitting: Family Medicine

## 2022-03-25 ENCOUNTER — Other Ambulatory Visit: Payer: Self-pay | Admitting: Medical

## 2022-03-25 DIAGNOSIS — F988 Other specified behavioral and emotional disorders with onset usually occurring in childhood and adolescence: Secondary | ICD-10-CM

## 2022-03-26 ENCOUNTER — Other Ambulatory Visit: Payer: Self-pay | Admitting: Family Medicine

## 2022-03-26 DIAGNOSIS — F988 Other specified behavioral and emotional disorders with onset usually occurring in childhood and adolescence: Secondary | ICD-10-CM

## 2022-03-26 MED ORDER — BENZONATATE 100 MG PO CAPS: 100.0000 mg | ORAL_CAPSULE | Freq: Three times a day (TID) | ORAL | 0 refills | Status: DC | PRN

## 2022-03-26 MED ORDER — AMPHETAMINE-DEXTROAMPHETAMINE 20 MG PO TABS: 20.0000 mg | ORAL_TABLET | Freq: Two times a day (BID) | ORAL | 0 refills | Status: DC

## 2022-03-26 NOTE — Telephone Encounter (Signed)
Requesting: Adderall '20mg'$   Contract: 12/22/21 UDS: 12/22/21 Last Visit: 12/22/21 Next Visit: None Last Refill: 01/20/22 #60 and RF   Please Advise

## 2022-03-26 NOTE — Telephone Encounter (Signed)
Requesting: Adderall '20mg'$   Contract:12/22/21 UDS:12/22/21 Last Visit: 12/22/21 Next Visit: None Last Refill: 01/20/22 #60 and 0RF   Please Advise

## 2022-03-30 ENCOUNTER — Ambulatory Visit (INDEPENDENT_AMBULATORY_CARE_PROVIDER_SITE_OTHER): Payer: Managed Care, Other (non HMO) | Admitting: Family Medicine

## 2022-03-30 ENCOUNTER — Encounter: Payer: Self-pay | Admitting: Family Medicine

## 2022-03-30 VITALS — BP 122/78 | HR 81 | Temp 98.4°F | Resp 12 | Ht 67.0 in | Wt 223.6 lb

## 2022-03-30 DIAGNOSIS — J40 Bronchitis, not specified as acute or chronic: Secondary | ICD-10-CM | POA: Insufficient documentation

## 2022-03-30 MED ORDER — FLUCONAZOLE 150 MG PO TABS: 150.0000 mg | ORAL_TABLET | Freq: Every day | ORAL | 0 refills | Status: DC

## 2022-03-30 MED ORDER — PREDNISONE 10 MG PO TABS: ORAL_TABLET | ORAL | 0 refills | Status: DC

## 2022-03-30 MED ORDER — AMOXICILLIN-POT CLAVULANATE 875-125 MG PO TABS: 1.0000 | ORAL_TABLET | Freq: Two times a day (BID) | ORAL | 0 refills | Status: DC

## 2022-03-30 MED ORDER — PROMETHAZINE-DM 6.25-15 MG/5ML PO SYRP: 5.0000 mL | ORAL_SOLUTION | Freq: Four times a day (QID) | ORAL | 0 refills | Status: DC | PRN

## 2022-03-30 NOTE — Assessment & Plan Note (Signed)
Abx per orders Phenergan dm Pred taper  Con't inhalers as needed  Cxr if no better

## 2022-03-30 NOTE — Progress Notes (Signed)
Subjective:   By signing my name below, I, Cheryl Patterson, attest that this documentation has been prepared under the direction and in the presence of Ann Held DO 03/30/2022     Patient ID: Cheryl Patterson, female    DOB: [birth year], 51 y.o.   MRN: [id number]  No chief complaint on file.   HPI Patient is in today for an office visit  Patient presents with a cough and wheezing. She also has associated symptoms of loss of appetite and chest tightness. She was seen by [place] PA on 03/16/2022 for symptoms. She was prescribed 250 mg of Azithromycin and 100 mg of Benzonatate however, symptoms are persistent. She states that symptoms are worse during the evening. She notes that she does sleep with a humidifier at night.   Past Medical History:  Diagnosis Date  . ADD (attention deficit disorder)   . Allergy   . Anxiety   . Asthma    per pt- bronchitis brings on the symptoms  . Bipolar depression ([employer])   . Chronic headaches   . Chronic kidney disease    kidney stones  . Constipation   . Depression    [facility]---suicidal ideation  . Dyspnea   . GERD (gastroesophageal reflux disease)   . Hyperlipidemia    no meds taken as of 04-20-16  . IBS (irritable bowel syndrome)   . Joint pain   . Kidney infection   . Lactose intolerance   . Prediabetes   . Ruptured disc, thoracic   . Sepsis ([place])    2014-  d/t urinary infection  . UTI (urinary tract infection)   . Vitamin D deficiency     Past Surgical History:  Procedure Laterality Date  . BREAST BIOPSY Right 2021  . BREAST EXCISIONAL BIOPSY Left    as teenager  . BREAST SURGERY     left breast lump removed  . COLONOSCOPY    . CYSTOSCOPY N/A 03/18/2015   Procedure: CYSTOSCOPY;  Surgeon: Terrance Mass, MD;  Location: [facility];  Service: Gynecology;  Laterality: N/A;  . KIDNEY STONE SURGERY    . LAPAROSCOPIC BILATERAL SALPINGECTOMY Bilateral 03/18/2015   Procedure: LAPAROSCOPIC BILATERAL SALPINGECTOMY;   Surgeon: Terrance Mass, MD;  Location: [facility];  Service: Gynecology;  Laterality: Bilateral;  . LAPAROSCOPIC VAGINAL HYSTERECTOMY WITH SALPINGO OOPHORECTOMY N/A 03/18/2015   Procedure: LAPAROSCOPIC ASSISTED VAGINAL HYSTERECTOMY WITH SALPINGO OOPHORECTOMY;  Surgeon: Terrance Mass, MD;  Location: [facility];  Service: Gynecology;  Laterality: N/A;  . LEEP  2005  . LIPOMA EXCISION    . MOUTH SURGERY    . OTHER SURGICAL HISTORY     RENAL STONE REMOVAL  . TUBAL LIGATION      Family History  Problem Relation Age of Onset  . Colon cancer Mother 37  . Cancer Mother 52       colon  . Anxiety disorder Mother   . Hypertension Father   . Hyperlipidemia Father   . Colon cancer Maternal Aunt   . Cancer Maternal Aunt        colon  . Uterine cancer Maternal Aunt   . Brain cancer Maternal Aunt   . Cancer Maternal Uncle        COLON  . Breast cancer Paternal Aunt   . Cancer Paternal Aunt        breast  . Colon cancer Paternal Uncle   . Diabetes Maternal Grandfather   . Stroke Paternal Grandmother   . Hypertension  Paternal Grandmother   . Heart disease Paternal Grandmother   . Hypertension Other   . Esophageal cancer Neg Hx   . Rectal cancer Neg Hx   . Stomach cancer Neg Hx     Social History   Socioeconomic History  . Marital status: Married    Spouse name: Octavia Bruckner  . Number of children: 2  . Years of education: Not on file  . Highest education level: Not on file  Occupational History  . Occupation: Lobbyist: [employer]  Tobacco Use  . Smoking status: Never    Passive exposure: Never  . Smokeless tobacco: Never  Vaping Use  . Vaping Use: Never used  Substance and Sexual Activity  . Alcohol use: No    Alcohol/week: 0.0 standard drinks of alcohol  . Drug use: No  . Sexual activity: Yes    Partners: Male    Birth control/protection: None  Other Topics Concern  . Not on file  Social History Narrative   Exercise--walk qd                     Video--  rare   Social Determinants of Health   Financial Resource Strain: Not on file  Food Insecurity: Not on file  Transportation Needs: Not on file  Physical Activity: Not on file  Stress: Not on file  Social Connections: Not on file  Intimate Partner Violence: Not on file    Outpatient Medications Prior to Visit  Medication Sig Dispense Refill  . albuterol (VENTOLIN HFA) 108 (90 Base) MCG/ACT inhaler Inhale 2 puffs into the lungs every 6 (six) hours as needed for wheezing or shortness of breath. 18 g 5  . amphetamine-dextroamphetamine (ADDERALL) 20 MG tablet Take 1 tablet (20 mg total) by mouth 2 (two) times daily. 60 tablet 0  . benzonatate (TESSALON) 100 MG capsule Take 1 capsule (100 mg total) by mouth 3 (three) times daily as needed for cough. 30 capsule 0  . FLUoxetine (PROZAC) 20 MG tablet Take 1 tablet (20 mg total) by mouth daily. 90 tablet 3  . fluticasone (FLONASE) 50 MCG/ACT nasal spray Place 2 sprays into both nostrils daily. 16 g 6  . pantoprazole (PROTONIX) 40 MG tablet TAKE 1 TABLET BY MOUTH EVERY DAY 90 tablet 1  . rosuvastatin (CRESTOR) 20 MG tablet TAKE 1 TABLET BY MOUTH EVERYDAY AT BEDTIME 90 tablet 1  . Vitamin D, Ergocalciferol, (DRISDOL) 1.25 MG (50000 UNIT) CAPS capsule TAKE 1 CAPSULE (50,000 UNITS TOTAL) BY MOUTH EVERY 7 (SEVEN) DAYS 12 capsule 0   Facility-Administered Medications Prior to Visit  Medication Dose Route Frequency Provider Last Rate Last Admin  . 0.9 %  sodium chloride infusion  500 mL Intravenous Continuous Milus Banister, MD        Allergies  Allergen Reactions  . Codeine     Itchy throat, whelps on skin  . Other Swelling and Rash    tomatoes    Review of Systems  Constitutional:  Negative for fever and malaise/fatigue.       (+) Loss of Appetite  HENT:  Negative for congestion.   Eyes:  Negative for blurred vision.  Respiratory:  Positive for cough and wheezing. Negative for shortness of breath.   Cardiovascular:  Negative  for chest pain, palpitations and leg swelling.  Gastrointestinal:  Negative for vomiting.  Musculoskeletal:  Negative for back pain.       (+) Chest Tightness  Skin:  Negative for  rash.  Neurological:  Negative for loss of consciousness and headaches.       Objective:    Physical Exam Vitals and nursing note reviewed.  Constitutional:      General: She is not in acute distress.    Appearance: Normal appearance. She is not ill-appearing.  HENT:     Head: Normocephalic and atraumatic.     Right Ear: External ear normal.     Left Ear: External ear normal.  Eyes:     Extraocular Movements: Extraocular movements intact.     Pupils: Pupils are equal, round, and reactive to light.  Cardiovascular:     Rate and Rhythm: Normal rate and regular rhythm.     Heart sounds: Normal heart sounds. No murmur heard.    No gallop.  Pulmonary:     Effort: Pulmonary effort is normal. No respiratory distress.     Breath sounds: Normal breath sounds. No wheezing or rales.  Lymphadenopathy:     Cervical: Cervical adenopathy present.  Skin:    General: Skin is warm and dry.  Neurological:     Mental Status: She is alert and oriented to person, place, and time.  Psychiatric:        Judgment: Judgment normal.    LMP 12/02/2014  Wt Readings from Last 3 Encounters:  12/22/21 225 lb 3.2 oz (102.2 kg)  07/16/21 220 lb (99.8 kg)  06/02/21 217 lb 11.2 oz (98.7 kg)    Diabetic Foot Exam - Simple   No data filed    Lab Results  Component Value Date   WBC 5.9 12/22/2021   HGB 12.9 12/22/2021   HCT 39.3 12/22/2021   PLT 283.0 12/22/2021   GLUCOSE 83 12/22/2021   CHOL 213 (H) 12/22/2021   TRIG 88.0 12/22/2021   HDL 43.20 12/22/2021   LDLDIRECT 144.3 06/25/2010   LDLCALC 153 (H) 12/22/2021   ALT 12 12/22/2021   AST 17 12/22/2021   NA 136 12/22/2021   K 4.2 12/22/2021   CL 98 12/22/2021   CREATININE 0.80 12/22/2021   BUN 8 12/22/2021   CO2 30 12/22/2021   TSH 1.38 12/22/2021   HGBA1C  5.9 04/30/2019    Lab Results  Component Value Date   TSH 1.38 12/22/2021   Lab Results  Component Value Date   WBC 5.9 12/22/2021   HGB 12.9 12/22/2021   HCT 39.3 12/22/2021   MCV 86.0 12/22/2021   PLT 283.0 12/22/2021   Lab Results  Component Value Date   NA 136 12/22/2021   K 4.2 12/22/2021   CO2 30 12/22/2021   GLUCOSE 83 12/22/2021   BUN 8 12/22/2021   CREATININE 0.80 12/22/2021   BILITOT 0.5 12/22/2021   ALKPHOS 106 12/22/2021   AST 17 12/22/2021   ALT 12 12/22/2021   PROT 7.2 12/22/2021   ALBUMIN 4.2 12/22/2021   CALCIUM 9.6 12/22/2021   ANIONGAP 8 02/10/2021   GFR 85.16 12/22/2021   Lab Results  Component Value Date   CHOL 213 (H) 12/22/2021   Lab Results  Component Value Date   HDL 43.20 12/22/2021   Lab Results  Component Value Date   LDLCALC 153 (H) 12/22/2021   Lab Results  Component Value Date   TRIG 88.0 12/22/2021   Lab Results  Component Value Date   CHOLHDL 5 12/22/2021   Lab Results  Component Value Date   HGBA1C 5.9 04/30/2019       Assessment & Plan:   Problem List Items Addressed This Visit  None  No orders of the defined types were placed in this encounter.   I, Cheryl Patterson, personally preformed the services described in this documentation.  All medical record entries made by the scribe were at my direction and in my presence.  I have reviewed the chart and discharge instructions (if applicable) and agree that the record reflects my personal performance and is accurate and complete. 03/30/2022   I,Amber Collins,acting as a scribe for Ann Held, DO.,have documented all relevant documentation on the behalf of Ann Held, DO,as directed by  Ann Held, DO while in the presence of Ann Held, DO.    [employer]

## 2022-04-21 ENCOUNTER — Encounter: Payer: Self-pay | Admitting: Nurse Practitioner

## 2022-04-21 ENCOUNTER — Ambulatory Visit (INDEPENDENT_AMBULATORY_CARE_PROVIDER_SITE_OTHER): Payer: Managed Care, Other (non HMO) | Admitting: Nurse Practitioner

## 2022-04-21 VITALS — BP 118/68 | HR 94 | Ht 67.0 in | Wt 225.0 lb

## 2022-04-21 DIAGNOSIS — R112 Nausea with vomiting, unspecified: Secondary | ICD-10-CM | POA: Diagnosis not present

## 2022-04-21 DIAGNOSIS — Z800 Family history of malignant neoplasm of digestive organs: Secondary | ICD-10-CM

## 2022-04-21 DIAGNOSIS — R1010 Upper abdominal pain, unspecified: Secondary | ICD-10-CM | POA: Diagnosis not present

## 2022-04-21 MED ORDER — NA SULFATE-K SULFATE-MG SULF 17.5-3.13-1.6 GM/177ML PO SOLN: 1.0000 | Freq: Once | ORAL | 0 refills | Status: AC

## 2022-04-21 NOTE — Patient Instructions (Addendum)
You have been scheduled for an endoscopy and colonoscopy. Please follow the written instructions given to you at your visit today. Please pick up your prep supplies at the pharmacy within the next 1-3 days. If you use inhalers (even only as needed), please bring them with you on the day of your procedure.    If you are age 52 or younger, your body mass index should be between 19-25. Your Body mass index is 35.24 kg/m. If this is out of the aformentioned range listed, please consider follow up with your Primary Care Provider.   __________________________________________________________  The [facility] providers would like to encourage you to use [facility] to communicate with providers for non-urgent requests or questions.  Due to long hold times on the telephone, sending your provider a message by [facility] may be a faster and more efficient way to get a response.  Please allow 48 business hours for a response.  Please remember that this is for non-urgent requests.   Due to recent changes in healthcare laws, you may see the results of your imaging and laboratory studies on MyChart before your provider has had a chance to review them.  We understand that in some cases there may be results that are confusing or concerning to you. Not all laboratory results come back in the same time frame and the provider may be waiting for multiple results in order to interpret others.  Please give [place] 48 hours in order for your provider to thoroughly review all the results before contacting the office for clarification of your results.     Thank you for choosing me and [facility].  Tye Savoy, NP. Marland Kitchen

## 2022-04-21 NOTE — Progress Notes (Signed)
Assessment    Patient profile:  Cheryl Patterson is a 52 y.o. year old female known to Dr. Ardis Hughs with a [facility] of colon cancer, obesity , HLD, kidney stones and GERD  See PMH / [place] for additional history  # [facility] of CRC in 1st degree relative ( mother in her 63's) and also maternal aunt with CRC in her 62's. Patient is overdue for 5 year interval colonoscopy.   # Mild constipation. Improved with herbal tea  # Three month history of intermittent mid upper abdominal pain and nausea unrelated to eating. CMET, CBC and H.pylori breath test were normal. PUD ? Component of musculoskeletal is possible. Gallbladder disease seems unlikely.   # GERD asymptomatic on PPI which she takes almost every day.   Plan:    Schedule for a screening colonoscopy. The risks and benefits of colonoscopy with possible polypectomy / biopsies were discussed and the patient agrees to proceed. Two day bowel prep Schedule for EGD to evaluate upper abdominal pain and nausea. The risks and benefits of EGD with possible biopsies were discussed with the patient who agrees to proceed.  Continue daily PPI   HPI:    Chief Complaint: upper abdominal pain / nausea.. Due for colonoscopy. Having mild constipation.   Patient's mother had colon cancer around age 75. Her maternal aunt had colon cancer in her late forty's. Two of father's uncles had colon cancer. Also, pancreatic cancer in a second cousin.   Patient having intermittent nausea and crampy mid upper abdominal  pain for 3 months now. Saw PCP in September and discussed the pain. Labs including a CMET, CBC and H.pylori breath test were normal.  The upper abdominal pain doesn't radiate through to her back. Episodes occur about once a week and can last for variable amounts of time.  Pain has no relationship to food. It is worse if she leans over.like to tie her shoe  The pain isn't relieved with ibuprofen. Thought pain may be trapped gas but Gas-x didn't help either. Feels like  menstrual cramps in her upper abdomen. . No regular use of ibuprofen or other NSAIDs. . No associated weight loss  No blood in stool or black stool. She has developed some mild constipation. Recent TSH was normal. Herbal tea is helping   Previous Labs / Imaging::    Latest Ref Rng & Units 12/22/2021    2:11 PM 06/02/2021    5:35 PM 02/10/2021    3:15 PM  CBC  WBC 4.0 - 10.5 K/uL 5.9  5.5  6.9   Hemoglobin 12.0 - 15.0 g/dL 12.9  13.1  12.8   Hematocrit 36.0 - 46.0 % 39.3  40.2  38.0   Platelets 150.0 - 400.0 K/uL 283.0  296.0  288     Lab Results  Component Value Date   LIPASE 55.0 06/02/2021      Latest Ref Rng & Units 12/22/2021    2:11 PM 06/02/2021    5:35 PM 02/10/2021    3:15 PM  CMP  Glucose 70 - 99 mg/dL 83  89  92   BUN 6 - 23 mg/dL '8  13  8   '$ Creatinine 0.40 - 1.20 mg/dL 0.80  0.93  0.70   Sodium 135 - 145 mEq/L 136  139  133   Potassium 3.5 - 5.1 mEq/L 4.2  4.2  3.4   Chloride 96 - 112 mEq/L 98  100  99   CO2 19 - 32 mEq/L 30  34  26   Calcium 8.4 - 10.5 mg/dL 9.6  9.7  9.2   Total Protein 6.0 - 8.3 g/dL 7.2  7.7  7.3   Total Bilirubin 0.2 - 1.2 mg/dL 0.5  0.3  0.4   Alkaline Phos 39 - 117 U/L 106  90  89   AST 0 - 37 U/L '17  17  14   '$ ALT 0 - 35 U/L '12  14  10     '$ Previous GI Evaluation   Colonoscopy June 2013 -Tiny sigmoid polyp. Otherwise normal.  Path - hyperplastic. Polyp  Colonoscopy Jan 2018 colonoscopy  -Normal  Past Medical History:  Diagnosis Date   ADD (attention deficit disorder)    Allergy    Anxiety    Asthma    per pt- bronchitis brings on the symptoms   Bipolar depression ([place])    Chronic headaches    Chronic kidney disease    kidney stones   Constipation    Depression    [facility]---suicidal ideation   Dyspnea    GERD (gastroesophageal reflux disease)    Hyperlipidemia    no meds taken as of 04-20-16   IBS (irritable bowel syndrome)    Joint pain    Kidney infection    Lactose intolerance    Prediabetes     Ruptured disc, thoracic    Sepsis ([place])    2014-  d/t urinary infection   UTI (urinary tract infection)    Vitamin D deficiency    Past Surgical History:  Procedure Laterality Date   BREAST BIOPSY Right 2021   BREAST EXCISIONAL BIOPSY Left    as teenager   BREAST SURGERY     left breast lump removed   COLONOSCOPY     CYSTOSCOPY N/A 03/18/2015   Procedure: CYSTOSCOPY;  Surgeon: Terrance Mass, MD;  Location: [facility];  Service: Gynecology;  Laterality: N/A;   KIDNEY STONE SURGERY     LAPAROSCOPIC BILATERAL SALPINGECTOMY Bilateral 03/18/2015   Procedure: LAPAROSCOPIC BILATERAL SALPINGECTOMY;  Surgeon: Terrance Mass, MD;  Location: [facility];  Service: Gynecology;  Laterality: Bilateral;   LAPAROSCOPIC VAGINAL HYSTERECTOMY WITH SALPINGO OOPHORECTOMY N/A 03/18/2015   Procedure: LAPAROSCOPIC ASSISTED VAGINAL HYSTERECTOMY WITH SALPINGO OOPHORECTOMY;  Surgeon: Terrance Mass, MD;  Location: [facility];  Service: Gynecology;  Laterality: N/A;   LEEP  2005   LIPOMA EXCISION     MOUTH SURGERY     OTHER SURGICAL HISTORY     RENAL STONE REMOVAL   TUBAL LIGATION     Family History  Problem Relation Age of Onset   Colon cancer Mother 29   Cancer Mother 25       colon   Anxiety disorder Mother    Hypertension Father    Hyperlipidemia Father    Colon cancer Maternal Aunt    Cancer Maternal Aunt        colon   Uterine cancer Maternal Aunt    Brain cancer Maternal Aunt    Cancer Maternal Uncle        COLON   Breast cancer Paternal Aunt    Cancer Paternal Aunt        breast   Colon cancer Paternal Uncle    Diabetes Maternal Grandfather    Stroke Paternal Grandmother    Hypertension Paternal Grandmother    Heart disease Paternal Grandmother    Hypertension Other    Esophageal cancer Neg Hx    Rectal cancer Neg Hx    Stomach cancer Neg Hx  Social History   Tobacco Use   Smoking status: Never    Passive exposure: Never   Smokeless tobacco: Never  Vaping Use   Vaping Use:  Never used  Substance Use Topics   Alcohol use: No    Alcohol/week: 0.0 standard drinks of alcohol   Drug use: No   Current Outpatient Medications  Medication Sig Dispense Refill   albuterol (VENTOLIN HFA) 108 (90 Base) MCG/ACT inhaler Inhale 2 puffs into the lungs every 6 (six) hours as needed for wheezing or shortness of breath. 18 g 5   amphetamine-dextroamphetamine (ADDERALL) 20 MG tablet Take 1 tablet (20 mg total) by mouth 2 (two) times daily. 60 tablet 0   benzonatate (TESSALON) 100 MG capsule Take 1 capsule (100 mg total) by mouth 3 (three) times daily as needed for cough. 30 capsule 0   FLUoxetine (PROZAC) 20 MG tablet Take 1 tablet (20 mg total) by mouth daily. 90 tablet 3   pantoprazole (PROTONIX) 40 MG tablet TAKE 1 TABLET BY MOUTH EVERY DAY 90 tablet 1   promethazine-dextromethorphan (PROMETHAZINE-DM) 6.25-15 MG/5ML syrup Take 5 mLs by mouth 4 (four) times daily as needed. 118 mL 0   rosuvastatin (CRESTOR) 20 MG tablet TAKE 1 TABLET BY MOUTH EVERYDAY AT BEDTIME 90 tablet 1   Vitamin D, Ergocalciferol, (DRISDOL) 1.25 MG (50000 UNIT) CAPS capsule TAKE 1 CAPSULE (50,000 UNITS TOTAL) BY MOUTH EVERY 7 (SEVEN) DAYS 12 capsule 0   Current Facility-Administered Medications  Medication Dose Route Frequency Provider Last Rate Last Admin   0.9 %  sodium chloride infusion  500 mL Intravenous Continuous Milus Banister, MD       Allergies  Allergen Reactions   Codeine     Itchy throat, whelps on skin   Other Swelling and Rash    tomatoes     Review of Systems: Positive for allergy, sinus trouble muscle pain and cramps, night sweats, shortness of breath. .  All other systems reviewed and negative except where noted in HPI.   Wt Readings from Last 3 Encounters:  04/21/22 225 lb (102.1 kg)  03/30/22 223 lb 9.6 oz (101.4 kg)  12/22/21 225 lb 3.2 oz (102.2 kg)    Physical Exam   BP 118/68   Pulse 94   Ht '5\' 7"'$  (1.702 m)   Wt 225 lb (102.1 kg)   LMP 12/02/2014   SpO2 96%    BMI 35.24 kg/m  Constitutional:  Generally well appearing female in no acute distress. Psychiatric: Pleasant. Normal mood and affect. Behavior is normal. EENT: Pupils normal.  Conjunctivae are normal. No scleral icterus. Neck supple.  Cardiovascular: Normal rate, regular rhythm.  Pulmonary/chest: Effort normal and breath sounds normal. No wheezing, rales or rhonchi. Abdominal: Soft, nondistended, nontender. Bowel sounds active throughout. There are no masses palpable. No hepatomegaly. Neurological: Alert and oriented to person place and time. Extremities: No edema Skin: Skin is warm and dry. No rashes noted.  Tye Savoy, NP  04/21/2022, 3:04 PM  Cc:  Referring Provider Carollee Herter, Alferd Apa, *

## 2022-04-21 NOTE — Progress Notes (Signed)
I agree with the assessment and plan as outlined by Ms. Guenther. 

## 2022-05-21 ENCOUNTER — Encounter: Payer: Self-pay | Admitting: Internal Medicine

## 2022-05-21 ENCOUNTER — Ambulatory Visit: Payer: Managed Care, Other (non HMO) | Admitting: Internal Medicine

## 2022-05-21 VITALS — BP 131/79 | HR 73 | Temp 98.4°F | Resp 13 | Ht 67.0 in | Wt 225.0 lb

## 2022-05-21 DIAGNOSIS — D123 Benign neoplasm of transverse colon: Secondary | ICD-10-CM

## 2022-05-21 DIAGNOSIS — R112 Nausea with vomiting, unspecified: Secondary | ICD-10-CM

## 2022-05-21 DIAGNOSIS — R1010 Upper abdominal pain, unspecified: Secondary | ICD-10-CM

## 2022-05-21 DIAGNOSIS — Z1211 Encounter for screening for malignant neoplasm of colon: Secondary | ICD-10-CM | POA: Diagnosis present

## 2022-05-21 DIAGNOSIS — D125 Benign neoplasm of sigmoid colon: Secondary | ICD-10-CM

## 2022-05-21 DIAGNOSIS — D120 Benign neoplasm of cecum: Secondary | ICD-10-CM | POA: Diagnosis not present

## 2022-05-21 DIAGNOSIS — K2990 Gastroduodenitis, unspecified, without bleeding: Secondary | ICD-10-CM | POA: Diagnosis not present

## 2022-05-21 DIAGNOSIS — Z800 Family history of malignant neoplasm of digestive organs: Secondary | ICD-10-CM

## 2022-05-21 DIAGNOSIS — K3189 Other diseases of stomach and duodenum: Secondary | ICD-10-CM | POA: Diagnosis not present

## 2022-05-21 DIAGNOSIS — D122 Benign neoplasm of ascending colon: Secondary | ICD-10-CM | POA: Diagnosis not present

## 2022-05-21 DIAGNOSIS — K2970 Gastritis, unspecified, without bleeding: Secondary | ICD-10-CM | POA: Diagnosis not present

## 2022-05-21 DIAGNOSIS — K319 Disease of stomach and duodenum, unspecified: Secondary | ICD-10-CM | POA: Diagnosis not present

## 2022-05-21 MED ORDER — SODIUM CHLORIDE 0.9 % IV SOLN: 500.0000 mL | Freq: Once | INTRAVENOUS | Status: DC

## 2022-05-21 MED ORDER — PANTOPRAZOLE SODIUM 40 MG PO TBEC: 40.0000 mg | DELAYED_RELEASE_TABLET | Freq: Two times a day (BID) | ORAL | 1 refills | Status: DC

## 2022-05-21 NOTE — Progress Notes (Signed)
Called to room to assist during endoscopic procedure.  Patient ID and intended procedure confirmed with present staff. Received instructions for my participation in the procedure from the performing physician.  

## 2022-05-21 NOTE — Progress Notes (Signed)
Report to pacu rn. Vss. Care resumed by rn. 

## 2022-05-21 NOTE — Op Note (Signed)
[facility] Patient Name: Cheryl Patterson Procedure Date: 05/21/2022 2:08 PM MRN: [id number] Endoscopist: Cheryl Patterson , , [id number] Age: 52 Referring MD:  Date of Birth: 10-23-1970 Gender: Female Account #: [id number] Procedure:                Upper GI endoscopy Indications:              Epigastric abdominal pain, Heartburn Medicines:                Monitored Anesthesia Care Procedure:                Pre-Anesthesia Assessment:                           - Prior to the procedure, a History and Physical                            was performed, and patient medications and                            allergies were reviewed. The patient's tolerance of                            previous anesthesia was also reviewed. The risks                            and benefits of the procedure and the sedation                            options and risks were discussed with the patient.                            All questions were answered, and informed consent                            was obtained. Prior Anticoagulants: The patient has                            taken no anticoagulant or antiplatelet agents. ASA                            Grade Assessment: II - A patient with mild systemic                            disease. After reviewing the risks and benefits,                            the patient was deemed in satisfactory condition to                            undergo the procedure.                           After obtaining informed consent, the endoscope was  passed under direct vision. Throughout the                            procedure, the patient's blood pressure, pulse, and                            oxygen saturations were monitored continuously. The                            GIF [id number] #[id number] was introduced through the                            mouth, and advanced to the second part of duodenum.                            The upper GI  endoscopy was accomplished without                            difficulty. The patient tolerated the procedure                            well. Scope In: Scope Out: Findings:                 The examined esophagus was normal.                           A small hiatal hernia was present.                           Localized mild inflammation characterized by                            congestion (edema) and erythema was found in the                            gastric antrum. Biopsies were taken with a cold                            forceps for histology.                           A single 5 mm sessile polyp with no bleeding was                            found in the first portion of the duodenum.                            Biopsies were taken with a cold forceps for                            histology.                           A single 10 mm submucosal nodule (appearance looks  like a cyst) with a localized distribution was                            found in the second portion of the duodenum.                            Biopsies were taken with a cold forceps for                            histology. Complications:            No immediate complications. Estimated Blood Loss:     Estimated blood loss was minimal. Impression:               - Normal esophagus.                           - Small hiatal hernia.                           - Gastritis. Biopsied.                           - A single duodenal polyp. Biopsied.                           - Submucosal nodule (suspected cyst) found in the                            duodenum. Biopsied. Recommendation:           - Use Protonix (pantoprazole) 40 mg PO BID for 8                            weeks.                           - Await pathology results.                           - Perform a colonoscopy today. Dr Georgian Co "Lyndee Leo" [place],  05/21/2022 2:49:26 PM

## 2022-05-21 NOTE — Op Note (Signed)
[facility] Patient Name: Cheryl Patterson Procedure Date: 05/21/2022 2:08 PM MRN: [id number] Endoscopist: Adline Mango [place] , , [id number] Age: 52 Referring MD:  Date of Birth: 05-16-1970 Gender: Female Account #: [id number] Procedure:                Colonoscopy Indications:              Screening patient at increased risk: Family history                            of 1st-degree relative with colorectal cancer at                            age 54 years (or older) Medicines:                Monitored Anesthesia Care Procedure:                Pre-Anesthesia Assessment:                           - Prior to the procedure, a History and Physical                            was performed, and patient medications and                            allergies were reviewed. The patient's tolerance of                            previous anesthesia was also reviewed. The risks                            and benefits of the procedure and the sedation                            options and risks were discussed with the patient.                            All questions were answered, and informed consent                            was obtained. Prior Anticoagulants: The patient has                            taken no anticoagulant or antiplatelet agents. ASA                            Grade Assessment: II - A patient with mild systemic                            disease. After reviewing the risks and benefits,                            the patient was deemed in satisfactory condition to  undergo the procedure.                           After obtaining informed consent, the colonoscope                            was passed under direct vision. Throughout the                            procedure, the patient's blood pressure, pulse, and                            oxygen saturations were monitored continuously. The                            CF [id number] #[id number] was  introduced through the anus                            and advanced to the the cecum, identified by                            appendiceal orifice and ileocecal valve. The                            colonoscopy was performed without difficulty. The                            patient tolerated the procedure well. The quality                            of the bowel preparation was good. The terminal                            ileum, ileocecal valve, appendiceal orifice, and                            rectum were photographed. Scope In: 2:26:01 PM Scope Out: 2:43:33 PM Scope Withdrawal Time: 0 hours 13 minutes 37 seconds  Total Procedure Duration: 0 hours 17 minutes 32 seconds  Findings:                 Four sessile polyps were found in the transverse                            colon, ascending colon and cecum. The polyps were 3                            to 10 mm in size. These polyps were removed with a                            cold snare. Resection and retrieval were complete.                           An 8 mm polyp was found in the  sigmoid colon. The                            polyp was sessile. The polyp was removed with a                            cold snare. Resection and retrieval were complete.                           Non-bleeding internal hemorrhoids were found during                            retroflexion. Complications:            No immediate complications. Estimated Blood Loss:     Estimated blood loss was minimal. Impression:               - Four 3 to 10 mm polyps in the transverse colon,                            in the ascending colon and in the cecum, removed                            with a cold snare. Resected and retrieved.                           - One 8 mm polyp in the sigmoid colon, removed with                            a cold snare. Resected and retrieved.                           - Non-bleeding internal hemorrhoids. Recommendation:           -  Discharge patient to home (with escort).                           - Await pathology results.                           - Return to GI clinic in 6 weeks.                           - The findings and recommendations were discussed                            with the patient. Dr Georgian Co "Lyndee Leo" [place],  05/21/2022 2:52:15 PM

## 2022-05-21 NOTE — Patient Instructions (Signed)
Thank you for coming in to see [place] today! Resume previous diet and medications today. Return to regular daily activities tomorrow. Biopsy results will be available in about one week.   New medication for short term, please start Pantoprazole 40 mg by mouth per day for 8 weeks.  Please return to GI clinic to see Dr Lorenso Courier in 6 weeks.  If you have not heard from our  office within one week, please call and make this appointment.     YOU HAD AN ENDOSCOPIC PROCEDURE TODAY AT [facility]:   Refer to the procedure report that was given to you for any specific questions about what was found during the examination.  If the procedure report does not answer your questions, please call your gastroenterologist to clarify.  If you requested that your care partner not be given the details of your procedure findings, then the procedure report has been included in a sealed envelope for you to review at your convenience later.  YOU SHOULD EXPECT: Some feelings of bloating in the abdomen. Passage of more gas than usual.  Walking can help get rid of the air that was put into your GI tract during the procedure and reduce the bloating. If you had a lower endoscopy (such as a colonoscopy or flexible sigmoidoscopy) you may notice spotting of blood in your stool or on the toilet paper. If you underwent a bowel prep for your procedure, you may not have a normal bowel movement for a few days.  Please Note:  You might notice some irritation and congestion in your nose or some drainage.  This is from the oxygen used during your procedure.  There is no need for concern and it should clear up in a day or so.  SYMPTOMS TO REPORT IMMEDIATELY:  Following lower endoscopy (colonoscopy or flexible sigmoidoscopy):  Excessive amounts of blood in the stool  Significant tenderness or worsening of abdominal pains  Swelling of the abdomen that is new, acute  Fever of 100F or higher  Following upper endoscopy  (EGD)  Vomiting of blood or coffee ground material  New chest pain or pain under the shoulder blades  Painful or persistently difficult swallowing  New shortness of breath  Fever of 100F or higher  Black, tarry-looking stools  For urgent or emergent issues, a gastroenterologist can be reached at any hour by calling [phone]. Do not use MyChart messaging for urgent concerns.    DIET:  We do recommend a small meal at first, but then you may proceed to your regular diet.  Drink plenty of fluids but you should avoid alcoholic beverages for 24 hours.  ACTIVITY:  You should plan to take it easy for the rest of today and you should NOT DRIVE or use heavy machinery until tomorrow (because of the sedation medicines used during the test).    FOLLOW UP: Our staff will call the number listed on your records the next business day following your procedure.  We will call around 7:15- 8:00 am to check on you and address any questions or concerns that you may have regarding the information given to you following your procedure. If we do not reach you, we will leave a message.     If any biopsies were taken you will be contacted by phone or by letter within the next 1-3 weeks.  Please call us at [phone] if you have not heard about the biopsies in 3 weeks.    SIGNATURES/CONFIDENTIALITY: You and/or  your care partner have signed paperwork which will be entered into your electronic medical record.  These signatures attest to the fact that that the information above on your After Visit Summary has been reviewed and is understood.  Full responsibility of the confidentiality of this discharge information lies with you and/or your care-partner.

## 2022-05-21 NOTE — Progress Notes (Signed)
GASTROENTEROLOGY PROCEDURE H&P NOTE   Primary Care Physician: Ann Held, DO    Reason for Procedure:   Family history of colon cancer, epigastric ab pain, nausea, GERD  Plan:    EGD/colonoscopy  Patient is appropriate for endoscopic procedure(s) in the ambulatory ([facility]) setting.  The nature of the procedure, as well as the risks, benefits, and alternatives were carefully and thoroughly reviewed with the patient. Ample time for discussion and questions allowed. The patient understood, was satisfied, and agreed to proceed.     HPI: Cheryl Patterson is a 52 y.o. female who presents for EGD/colonoscopy for evaluation of family history of colon cancer, epigastric ab pain, nausea, GERD .  Patient was most recently seen in the Gastroenterology Clinic on 04/21/22.  No interval change in medical history since that appointment. Please refer to that note for full details regarding GI history and clinical presentation.   Past Medical History:  Diagnosis Date   ADD (attention deficit disorder)    Allergy    Anxiety    Asthma    per pt- bronchitis brings on the symptoms   Bipolar depression ([place])    Chronic headaches    Chronic kidney disease    kidney stones   Constipation    Depression    [facility]---suicidal ideation   Dyspnea    GERD (gastroesophageal reflux disease)    Hyperlipidemia    no meds taken as of 04-20-16   IBS (irritable bowel syndrome)    Joint pain    Kidney infection    Lactose intolerance    Prediabetes    Ruptured disc, thoracic    Sepsis ([place])    2014-  d/t urinary infection   UTI (urinary tract infection)    Vitamin D deficiency     Past Surgical History:  Procedure Laterality Date   BREAST BIOPSY Right 2021   BREAST EXCISIONAL BIOPSY Left    as teenager   BREAST SURGERY     left breast lump removed   COLONOSCOPY     CYSTOSCOPY N/A 03/18/2015   Procedure: CYSTOSCOPY;  Surgeon: Terrance Mass, MD;  Location: [facility];   Service: Gynecology;  Laterality: N/A;   KIDNEY STONE SURGERY     LAPAROSCOPIC BILATERAL SALPINGECTOMY Bilateral 03/18/2015   Procedure: LAPAROSCOPIC BILATERAL SALPINGECTOMY;  Surgeon: Terrance Mass, MD;  Location: [facility];  Service: Gynecology;  Laterality: Bilateral;   LAPAROSCOPIC VAGINAL HYSTERECTOMY WITH SALPINGO OOPHORECTOMY N/A 03/18/2015   Procedure: LAPAROSCOPIC ASSISTED VAGINAL HYSTERECTOMY WITH SALPINGO OOPHORECTOMY;  Surgeon: Terrance Mass, MD;  Location: [facility];  Service: Gynecology;  Laterality: N/A;   LEEP  2005   LIPOMA EXCISION     MOUTH SURGERY     OTHER SURGICAL HISTORY     RENAL STONE REMOVAL   TUBAL LIGATION      Prior to Admission medications   Medication Sig Start Date End Date Taking? Authorizing Provider  amphetamine-dextroamphetamine (ADDERALL) 20 MG tablet Take 1 tablet (20 mg total) by mouth 2 (two) times daily. 03/26/22  Yes Roma Schanz R, DO  albuterol (VENTOLIN HFA) 108 (90 Base) MCG/ACT inhaler Inhale 2 puffs into the lungs every 6 (six) hours as needed for wheezing or shortness of breath. 03/16/22   Saguier, Percell Miller, PA-C  benzonatate (TESSALON) 100 MG capsule Take 1 capsule (100 mg total) by mouth 3 (three) times daily as needed for cough. 03/26/22   Roma Schanz R, DO  fluconazole (DIFLUCAN) 150 MG tablet Take 1  tablet (150 mg total) by mouth daily. Patient not taking: Reported on 05/21/2022 03/30/22   Carollee Herter, Alferd Apa, DO  FLUoxetine (PROZAC) 20 MG tablet Take 1 tablet (20 mg total) by mouth daily. Patient not taking: Reported on 05/21/2022 06/02/20   Carollee Herter, Kendrick Fries R, DO  pantoprazole (PROTONIX) 40 MG tablet TAKE 1 TABLET BY MOUTH EVERY DAY Patient not taking: Reported on 05/21/2022 03/31/21   Ann Held, DO  promethazine-dextromethorphan (PROMETHAZINE-DM) 6.25-15 MG/5ML syrup Take 5 mLs by mouth 4 (four) times daily as needed. 03/30/22   Roma Schanz R, DO  rosuvastatin (CRESTOR) 20 MG tablet TAKE 1 TABLET BY MOUTH  EVERYDAY AT BEDTIME Patient not taking: Reported on 05/21/2022 03/17/22   Ann Held, DO  Vitamin D, Ergocalciferol, (DRISDOL) 1.25 MG (50000 UNIT) CAPS capsule TAKE 1 CAPSULE (50,000 UNITS TOTAL) BY MOUTH EVERY 7 (SEVEN) DAYS 03/17/22   Ann Held, DO    Current Outpatient Medications  Medication Sig Dispense Refill   amphetamine-dextroamphetamine (ADDERALL) 20 MG tablet Take 1 tablet (20 mg total) by mouth 2 (two) times daily. 60 tablet 0   albuterol (VENTOLIN HFA) 108 (90 Base) MCG/ACT inhaler Inhale 2 puffs into the lungs every 6 (six) hours as needed for wheezing or shortness of breath. 18 g 5   benzonatate (TESSALON) 100 MG capsule Take 1 capsule (100 mg total) by mouth 3 (three) times daily as needed for cough. 30 capsule 0   fluconazole (DIFLUCAN) 150 MG tablet Take 1 tablet (150 mg total) by mouth daily. (Patient not taking: Reported on 05/21/2022) 2 tablet 0   FLUoxetine (PROZAC) 20 MG tablet Take 1 tablet (20 mg total) by mouth daily. (Patient not taking: Reported on 05/21/2022) 90 tablet 3   pantoprazole (PROTONIX) 40 MG tablet TAKE 1 TABLET BY MOUTH EVERY DAY (Patient not taking: Reported on 05/21/2022) 90 tablet 1   promethazine-dextromethorphan (PROMETHAZINE-DM) 6.25-15 MG/5ML syrup Take 5 mLs by mouth 4 (four) times daily as needed. 118 mL 0   rosuvastatin (CRESTOR) 20 MG tablet TAKE 1 TABLET BY MOUTH EVERYDAY AT BEDTIME (Patient not taking: Reported on 05/21/2022) 90 tablet 1   Vitamin D, Ergocalciferol, (DRISDOL) 1.25 MG (50000 UNIT) CAPS capsule TAKE 1 CAPSULE (50,000 UNITS TOTAL) BY MOUTH EVERY 7 (SEVEN) DAYS 12 capsule 0   Current Facility-Administered Medications  Medication Dose Route Frequency Provider Last Rate Last Admin   0.9 %  sodium chloride infusion  500 mL Intravenous Continuous Milus Banister, MD       0.9 %  sodium chloride infusion  500 mL Intravenous Once Sharyn Creamer, MD        Allergies as of 05/21/2022 - Review Complete 05/21/2022   Allergen Reaction Noted   Codeine  10/25/2006   Other Swelling and Rash 05/19/2012    Family History  Problem Relation Age of Onset   Colon cancer Mother 28   Cancer Mother 58       colon   Anxiety disorder Mother    Hypertension Father    Hyperlipidemia Father    Colon cancer Maternal Aunt    Cancer Maternal Aunt        colon   Uterine cancer Maternal Aunt    Brain cancer Maternal Aunt    Cancer Maternal Uncle        COLON   Breast cancer Paternal Aunt    Cancer Paternal Aunt        breast   Colon cancer Paternal Uncle  Diabetes Maternal Grandfather    Stroke Paternal Grandmother    Hypertension Paternal Grandmother    Heart disease Paternal Grandmother    Hypertension Other    Esophageal cancer Neg Hx    Rectal cancer Neg Hx    Stomach cancer Neg Hx     Social History   Socioeconomic History   Marital status: Married    Spouse name: Tim   Number of children: 2   Years of education: Not on file   Highest education level: Not on file  Occupational History   Occupation: Lobbyist: [employer]  Tobacco Use   Smoking status: Never    Passive exposure: Never   Smokeless tobacco: Never  Vaping Use   Vaping Use: Never used  Substance and Sexual Activity   Alcohol use: No    Alcohol/week: 0.0 standard drinks of alcohol   Drug use: No   Sexual activity: Yes    Partners: Male    Birth control/protection: None  Other Topics Concern   Not on file  Social History Narrative   Exercise--walk qd                    Video--  rare   Social Determinants of Health   Financial Resource Strain: Not on file  Food Insecurity: Not on file  Transportation Needs: Not on file  Physical Activity: Not on file  Stress: Not on file  Social Connections: Not on file  Intimate Partner Violence: Not on file    Physical Exam: Vital signs in last 24 hours: BP 122/73   Pulse 77   Temp 98.4 F (36.9 C) (Skin)   Ht '5\' 7"'$  (1.702 m)   Wt 225  lb (102.1 kg)   LMP 12/02/2014   SpO2 96%   BMI 35.24 kg/m  GEN: NAD EYE: Sclerae anicteric ENT: MMM CV: Non-tachycardic Pulm: No increased WOB GI: Soft NEURO:  Alert & Oriented   Christia Reading, MD [facility]   05/21/2022 1:26 PM

## 2022-05-21 NOTE — Progress Notes (Signed)
Pt's states no medical or surgical changes since previsit or office visit. VS assessed by N[facility] ?

## 2022-05-24 ENCOUNTER — Telehealth: Payer: Self-pay

## 2022-05-24 NOTE — Telephone Encounter (Signed)
Follow up call placed, VM obtained and message left. 

## 2022-05-26 ENCOUNTER — Encounter: Payer: Self-pay | Admitting: Internal Medicine

## 2022-05-31 ENCOUNTER — Other Ambulatory Visit: Payer: Self-pay | Admitting: Family Medicine

## 2022-05-31 DIAGNOSIS — F988 Other specified behavioral and emotional disorders with onset usually occurring in childhood and adolescence: Secondary | ICD-10-CM

## 2022-05-31 MED ORDER — AMPHETAMINE-DEXTROAMPHETAMINE 20 MG PO TABS: 20.0000 mg | ORAL_TABLET | Freq: Two times a day (BID) | ORAL | 0 refills | Status: DC

## 2022-05-31 NOTE — Telephone Encounter (Signed)
Requesting: Adderall 8m  Contract: 12/22/21 UDS:12/22/21 Last Visit: 03/30/22 Next Visit: None Last Refill: 03/26/22 #60 and 0RF   Please Advise

## 2022-06-17 ENCOUNTER — Telehealth (INDEPENDENT_AMBULATORY_CARE_PROVIDER_SITE_OTHER): Payer: Managed Care, Other (non HMO) | Admitting: Family Medicine

## 2022-06-17 ENCOUNTER — Encounter: Payer: Self-pay | Admitting: Family Medicine

## 2022-06-17 DIAGNOSIS — J029 Acute pharyngitis, unspecified: Secondary | ICD-10-CM | POA: Diagnosis not present

## 2022-06-17 MED ORDER — AMOXICILLIN-POT CLAVULANATE 875-125 MG PO TABS: 1.0000 | ORAL_TABLET | Freq: Two times a day (BID) | ORAL | 0 refills | Status: DC

## 2022-06-17 MED ORDER — FLUCONAZOLE 150 MG PO TABS: ORAL_TABLET | ORAL | 0 refills | Status: DC

## 2022-06-17 NOTE — Progress Notes (Signed)
MyChart Video Visit    Virtual Visit via Video Note   This visit type was conducted due to national recommendations for restrictions regarding the COVID-19 Pandemic (e.g. social distancing) in an effort to limit this patient's exposure and mitigate transmission in our community. This patient is at least at moderate risk for complications without adequate follow up. This format is felt to be most appropriate for this patient at this time. Physical exam was limited by quality of the video and audio technology used for the visit. heather was able to get the patient set up on a video visit.  Patient location: Home Patient and provider in visit Provider location: Office  I discussed the limitations of evaluation and management by telemedicine and the availability of in person appointments. The patient expressed understanding and agreed to proceed.  Visit Date: 06/17/2022  Today's healthcare provider: Ann Held, DO     Subjective:    Patient ID: Cheryl Patterson, female    DOB: [birth year], 52 y.o.   MRN: [id number]  Chief Complaint  Patient presents with   Sore Throat    Sore Throat  Associated symptoms include coughing (occasional), ear pain and headaches. Pertinent negatives include no abdominal pain, congestion or shortness of breath.   Patient is in today for a video visit.   She complains of sore throat and headache since last Friday. She is having occasional cough and ear pain as well. She has difficulty eating due to pain. She had a fever earlier this week but denies having any at this time. Her grandson her similar symptoms prior to her. She did not check her blood pressure or temperature today. She is taking benadryl and 2 tylenols at night for the past 2 days. She is also taking OTC cough syrup to help sooth her throat and cough.  She also had nausea and vomiting earlier last week found her symptoms improved since then.    Past Medical History:  Diagnosis Date    ADD (attention deficit disorder)    Allergy    Anxiety    Asthma    per pt- bronchitis brings on the symptoms   Bipolar depression ([place])    Chronic headaches    Chronic kidney disease    kidney stones   Constipation    Depression    [facility]---suicidal ideation   Dyspnea    GERD (gastroesophageal reflux disease)    Hyperlipidemia    no meds taken as of 04-20-16   IBS (irritable bowel syndrome)    Joint pain    Kidney infection    Lactose intolerance    Prediabetes    Ruptured disc, thoracic    Sepsis ([place])    2014-  d/t urinary infection   UTI (urinary tract infection)    Vitamin D deficiency     Past Surgical History:  Procedure Laterality Date   BREAST BIOPSY Right 2021   BREAST EXCISIONAL BIOPSY Left    as teenager   BREAST SURGERY     left breast lump removed   COLONOSCOPY     CYSTOSCOPY N/A 03/18/2015   Procedure: CYSTOSCOPY;  Surgeon: Terrance Mass, MD;  Location: [facility];  Service: Gynecology;  Laterality: N/A;   KIDNEY STONE SURGERY     LAPAROSCOPIC BILATERAL SALPINGECTOMY Bilateral 03/18/2015   Procedure: LAPAROSCOPIC BILATERAL SALPINGECTOMY;  Surgeon: Terrance Mass, MD;  Location: [facility];  Service: Gynecology;  Laterality: Bilateral;   LAPAROSCOPIC VAGINAL HYSTERECTOMY WITH SALPINGO OOPHORECTOMY N/A  03/18/2015   Procedure: LAPAROSCOPIC ASSISTED VAGINAL HYSTERECTOMY WITH SALPINGO OOPHORECTOMY;  Surgeon: Terrance Mass, MD;  Location: [facility];  Service: Gynecology;  Laterality: N/A;   LEEP  2005   LIPOMA EXCISION     MOUTH SURGERY     OTHER SURGICAL HISTORY     RENAL STONE REMOVAL   TUBAL LIGATION      Family History  Problem Relation Age of Onset   Colon cancer Mother 102   Cancer Mother 82       colon   Anxiety disorder Mother    Hypertension Father    Hyperlipidemia Father    Colon cancer Maternal Aunt    Cancer Maternal Aunt        colon   Uterine cancer Maternal Aunt    Brain cancer Maternal Aunt    Cancer Maternal Uncle         COLON   Breast cancer Paternal Aunt    Cancer Paternal Aunt        breast   Colon cancer Paternal Uncle    Diabetes Maternal Grandfather    Stroke Paternal Grandmother    Hypertension Paternal Grandmother    Heart disease Paternal Grandmother    Hypertension Other    Esophageal cancer Neg Hx    Rectal cancer Neg Hx    Stomach cancer Neg Hx     Social History   Socioeconomic History   Marital status: Married    Spouse name: Tim   Number of children: 2   Years of education: Not on file   Highest education level: Not on file  Occupational History   Occupation: Lobbyist: [employer]  Tobacco Use   Smoking status: Never    Passive exposure: Never   Smokeless tobacco: Never  Vaping Use   Vaping Use: Never used  Substance and Sexual Activity   Alcohol use: No    Alcohol/week: 0.0 standard drinks of alcohol   Drug use: No   Sexual activity: Yes    Partners: Male    Birth control/protection: None  Other Topics Concern   Not on file  Social History Narrative   Exercise--walk qd                    Video--  rare   Social Determinants of Health   Financial Resource Strain: Not on file  Food Insecurity: Not on file  Transportation Needs: Not on file  Physical Activity: Not on file  Stress: Not on file  Social Connections: Not on file  Intimate Partner Violence: Not on file    Outpatient Medications Prior to Visit  Medication Sig Dispense Refill   albuterol (VENTOLIN HFA) 108 (90 Base) MCG/ACT inhaler Inhale 2 puffs into the lungs every 6 (six) hours as needed for wheezing or shortness of breath. 18 g 5   amphetamine-dextroamphetamine (ADDERALL) 20 MG tablet Take 1 tablet (20 mg total) by mouth 2 (two) times daily. 60 tablet 0   benzonatate (TESSALON) 100 MG capsule Take 1 capsule (100 mg total) by mouth 3 (three) times daily as needed for cough. 30 capsule 0   fluconazole (DIFLUCAN) 150 MG tablet Take 1 tablet (150 mg total) by  mouth daily. (Patient not taking: Reported on 05/21/2022) 2 tablet 0   FLUoxetine (PROZAC) 20 MG tablet Take 1 tablet (20 mg total) by mouth daily. (Patient not taking: Reported on 05/21/2022) 90 tablet 3   pantoprazole (PROTONIX) 40 MG tablet Take 1 tablet (  40 mg total) by mouth 2 (two) times daily before a meal. 60 tablet 1   promethazine-dextromethorphan (PROMETHAZINE-DM) 6.25-15 MG/5ML syrup Take 5 mLs by mouth 4 (four) times daily as needed. 118 mL 0   rosuvastatin (CRESTOR) 20 MG tablet TAKE 1 TABLET BY MOUTH EVERYDAY AT BEDTIME (Patient not taking: Reported on 05/21/2022) 90 tablet 1   Vitamin D, Ergocalciferol, (DRISDOL) 1.25 MG (50000 UNIT) CAPS capsule TAKE 1 CAPSULE (50,000 UNITS TOTAL) BY MOUTH EVERY 7 (SEVEN) DAYS 12 capsule 0   No facility-administered medications prior to visit.    Allergies  Allergen Reactions   Codeine     Itchy throat, whelps on skin   Other Swelling and Rash    tomatoes    Review of Systems  Constitutional:  Negative for fever and malaise/fatigue.  HENT:  Positive for ear pain and sore throat. Negative for congestion.   Eyes:  Negative for blurred vision.  Respiratory:  Positive for cough (occasional). Negative for shortness of breath.   Cardiovascular:  Negative for chest pain, palpitations and leg swelling.  Gastrointestinal:  Negative for abdominal pain, blood in stool and nausea.  Genitourinary:  Negative for dysuria and frequency.  Musculoskeletal:  Negative for falls.  Skin:  Negative for rash.  Neurological:  Positive for headaches. Negative for dizziness and loss of consciousness.  Endo/Heme/Allergies:  Negative for environmental allergies.  Psychiatric/Behavioral:  Negative for depression. The patient is not nervous/anxious.        Objective:    Physical Exam Vitals and nursing note reviewed.  Constitutional:      Appearance: She is well-developed.  HENT:     Head: Normocephalic and atraumatic.  Neck:     Vascular: No carotid bruit or  JVD.  Pulmonary:     Effort: Pulmonary effort is normal.  Neurological:     General: No focal deficit present.     Mental Status: She is alert and oriented to person, place, and time.  Psychiatric:        Mood and Affect: Mood normal.        Behavior: Behavior normal.     LMP 12/02/2014  Wt Readings from Last 3 Encounters:  05/21/22 225 lb (102.1 kg)  04/21/22 225 lb (102.1 kg)  03/30/22 223 lb 9.6 oz (101.4 kg)       Assessment & Plan:  Pharyngitis, unspecified etiology -     Amoxicillin-Pot Clavulanate; Take 1 tablet by mouth 2 (two) times daily.  Dispense: 20 tablet; Refill: 0 -     Fluconazole; 1 po x1 as needed , may repeat in 3 days as needed  Dispense: 2 tablet; Refill: 0   Drink plenty of fluids Rest  Call or return to office as needed   I discussed the assessment and treatment plan with the patient. The patient was provided an opportunity to ask questions and all were answered. The patient agreed with the plan and demonstrated an understanding of the instructions.   The patient was advised to call back or seek an in-person evaluation if the symptoms worsen or if the condition fails to improve as anticipated.  [facility] [phone] (phone) [phone] (fax)  [place]    I,Shehryar Baig,acting as a scribe for Ann Held, DO.,have documented all relevant documentation on the behalf of Ann Held, DO,as directed by  Ann Held, DO while in the presence of Rosalita Chessman  Chase, DO.

## 2022-06-20 ENCOUNTER — Other Ambulatory Visit: Payer: Self-pay | Admitting: Internal Medicine

## 2022-06-20 DIAGNOSIS — K2970 Gastritis, unspecified, without bleeding: Secondary | ICD-10-CM

## 2022-06-29 ENCOUNTER — Ambulatory Visit: Payer: Managed Care, Other (non HMO) | Admitting: Family Medicine

## 2022-06-29 ENCOUNTER — Telehealth (INDEPENDENT_AMBULATORY_CARE_PROVIDER_SITE_OTHER): Payer: Managed Care, Other (non HMO) | Admitting: Family Medicine

## 2022-06-29 ENCOUNTER — Encounter: Payer: Self-pay | Admitting: Family Medicine

## 2022-06-29 DIAGNOSIS — J029 Acute pharyngitis, unspecified: Secondary | ICD-10-CM | POA: Diagnosis not present

## 2022-06-29 DIAGNOSIS — R051 Acute cough: Secondary | ICD-10-CM | POA: Diagnosis not present

## 2022-06-29 MED ORDER — PREDNISONE 10 MG PO TABS: ORAL_TABLET | ORAL | 0 refills | Status: DC

## 2022-06-29 MED ORDER — AZITHROMYCIN 250 MG PO TABS: ORAL_TABLET | ORAL | 0 refills | Status: DC

## 2022-06-29 MED ORDER — PROMETHAZINE-DM 6.25-15 MG/5ML PO SYRP: 5.0000 mL | ORAL_SOLUTION | Freq: Four times a day (QID) | ORAL | 0 refills | Status: DC | PRN

## 2022-06-29 NOTE — Progress Notes (Signed)
MyChart Video Visit    Virtual Visit via Video Note   This visit type was conducted due to national recommendations for restrictions regarding the COVID-19 Pandemic (e.g. social distancing) in an effort to limit this patient's exposure and mitigate transmission in our community. This patient is at least at moderate risk for complications without adequate follow up. This format is felt to be most appropriate for this patient at this time. Physical exam was limited by quality of the video and audio technology used for the visit. heather was able to get the patient set up on a video visit. e Patient location: home Patient and provider in visit Provider location: Office  I discussed the limitations of evaluation and management by telemedicine and the availability of in person appointments. The patient expressed understanding and agreed to proceed.  Visit Date: 06/29/2022  Today's healthcare provider: Ann Held, DO     Subjective:    Patient ID: Cheryl Patterson, female    DOB: [birth year], 52 y.o.   MRN: [id number]  Chief Complaint  Patient presents with   Cough    HPI Patient is in today for con't sore throat that is better but still hurting,  She also has a cough tdhat is no better and is worse at night.  No fevers. No Nvd.    Past Medical History:  Diagnosis Date   ADD (attention deficit disorder)    Allergy    Anxiety    Asthma    per pt- bronchitis brings on the symptoms   Bipolar depression ([place])    Chronic headaches    Chronic kidney disease    kidney stones   Constipation    Depression    [facility]---suicidal ideation   Dyspnea    GERD (gastroesophageal reflux disease)    Hyperlipidemia    no meds taken as of 04-20-16   IBS (irritable bowel syndrome)    Joint pain    Kidney infection    Lactose intolerance    Prediabetes    Ruptured disc, thoracic    Sepsis ([place])    2014-  d/t urinary infection   UTI (urinary tract infection)     Vitamin D deficiency     Past Surgical History:  Procedure Laterality Date   BREAST BIOPSY Right 2021   BREAST EXCISIONAL BIOPSY Left    as teenager   BREAST SURGERY     left breast lump removed   COLONOSCOPY     CYSTOSCOPY N/A 03/18/2015   Procedure: CYSTOSCOPY;  Surgeon: Terrance Mass, MD;  Location: [facility];  Service: Gynecology;  Laterality: N/A;   KIDNEY STONE SURGERY     LAPAROSCOPIC BILATERAL SALPINGECTOMY Bilateral 03/18/2015   Procedure: LAPAROSCOPIC BILATERAL SALPINGECTOMY;  Surgeon: Terrance Mass, MD;  Location: [facility];  Service: Gynecology;  Laterality: Bilateral;   LAPAROSCOPIC VAGINAL HYSTERECTOMY WITH SALPINGO OOPHORECTOMY N/A 03/18/2015   Procedure: LAPAROSCOPIC ASSISTED VAGINAL HYSTERECTOMY WITH SALPINGO OOPHORECTOMY;  Surgeon: Terrance Mass, MD;  Location: [facility];  Service: Gynecology;  Laterality: N/A;   LEEP  2005   LIPOMA EXCISION     MOUTH SURGERY     OTHER SURGICAL HISTORY     RENAL STONE REMOVAL   TUBAL LIGATION      Family History  Problem Relation Age of Onset   Colon cancer Mother 90   Cancer Mother 76       colon   Anxiety disorder Mother    Hypertension Father  Hyperlipidemia Father    Colon cancer Maternal Aunt    Cancer Maternal Aunt        colon   Uterine cancer Maternal Aunt    Brain cancer Maternal Aunt    Cancer Maternal Uncle        COLON   Breast cancer Paternal Aunt    Cancer Paternal Aunt        breast   Colon cancer Paternal Uncle    Diabetes Maternal Grandfather    Stroke Paternal Grandmother    Hypertension Paternal Grandmother    Heart disease Paternal Grandmother    Hypertension Other    Esophageal cancer Neg Hx    Rectal cancer Neg Hx    Stomach cancer Neg Hx     Social History   Socioeconomic History   Marital status: Married    Spouse name: Tim   Number of children: 2   Years of education: Not on file   Highest education level: Not on file  Occupational History   Occupation: Designer, jewellery: [employer]  Tobacco Use   Smoking status: Never    Passive exposure: Never   Smokeless tobacco: Never  Vaping Use   Vaping Use: Never used  Substance and Sexual Activity   Alcohol use: No    Alcohol/week: 0.0 standard drinks of alcohol   Drug use: No   Sexual activity: Yes    Partners: Male    Birth control/protection: None  Other Topics Concern   Not on file  Social History Narrative   Exercise--walk qd                    Video--  rare   Social Determinants of Health   Financial Resource Strain: Not on file  Food Insecurity: Not on file  Transportation Needs: Not on file  Physical Activity: Not on file  Stress: Not on file  Social Connections: Not on file  Intimate Partner Violence: Not on file    Outpatient Medications Prior to Visit  Medication Sig Dispense Refill   albuterol (VENTOLIN HFA) 108 (90 Base) MCG/ACT inhaler Inhale 2 puffs into the lungs every 6 (six) hours as needed for wheezing or shortness of breath. 18 g 5   amoxicillin-clavulanate (AUGMENTIN) 875-125 MG tablet Take 1 tablet by mouth 2 (two) times daily. 20 tablet 0   amphetamine-dextroamphetamine (ADDERALL) 20 MG tablet Take 1 tablet (20 mg total) by mouth 2 (two) times daily. 60 tablet 0   benzonatate (TESSALON) 100 MG capsule Take 1 capsule (100 mg total) by mouth 3 (three) times daily as needed for cough. 30 capsule 0   fluconazole (DIFLUCAN) 150 MG tablet 1 po x1 as needed , may repeat in 3 days as needed 2 tablet 0   pantoprazole (PROTONIX) 40 MG tablet TAKE 1 TABLET (40 MG TOTAL) BY MOUTH TWICE A DAY BEFORE MEALS 180 tablet 1   Vitamin D, Ergocalciferol, (DRISDOL) 1.25 MG (50000 UNIT) CAPS capsule TAKE 1 CAPSULE (50,000 UNITS TOTAL) BY MOUTH EVERY 7 (SEVEN) DAYS 12 capsule 0   promethazine-dextromethorphan (PROMETHAZINE-DM) 6.25-15 MG/5ML syrup Take 5 mLs by mouth 4 (four) times daily as needed. 118 mL 0   fluconazole (DIFLUCAN) 150 MG tablet Take 1 tablet (150 mg total)  by mouth daily. (Patient not taking: Reported on 05/21/2022) 2 tablet 0   FLUoxetine (PROZAC) 20 MG tablet Take 1 tablet (20 mg total) by mouth daily. (Patient not taking: Reported on 05/21/2022) 90 tablet 3  rosuvastatin (CRESTOR) 20 MG tablet TAKE 1 TABLET BY MOUTH EVERYDAY AT BEDTIME (Patient not taking: Reported on 05/21/2022) 90 tablet 1   No facility-administered medications prior to visit.    Allergies  Allergen Reactions   Codeine     Itchy throat, whelps on skin   Other Swelling and Rash    tomatoes    Review of Systems  Constitutional:  Negative for fever and malaise/fatigue.  HENT:  Positive for congestion and sinus pain. Negative for sore throat.   Eyes:  Negative for blurred vision.  Respiratory:  Positive for cough and sputum production. Negative for shortness of breath.   Cardiovascular:  Negative for chest pain, palpitations and leg swelling.  Gastrointestinal:  Negative for abdominal pain, blood in stool and nausea.  Genitourinary:  Negative for dysuria and frequency.  Musculoskeletal:  Negative for falls.  Skin:  Negative for rash.  Neurological:  Negative for dizziness, loss of consciousness and headaches.  Endo/Heme/Allergies:  Negative for environmental allergies.  Psychiatric/Behavioral:  Negative for depression. The patient is not nervous/anxious.        Objective:    Physical Exam Vitals and nursing note reviewed.  Pulmonary:     Effort: Pulmonary effort is normal.  Neurological:     General: No focal deficit present.     Mental Status: She is alert and oriented to person, place, and time.     LMP 12/02/2014  Wt Readings from Last 3 Encounters:  05/21/22 225 lb (102.1 kg)  04/21/22 225 lb (102.1 kg)  03/30/22 223 lb 9.6 oz (101.4 kg)       Assessment & Plan:  Pharyngitis, unspecified etiology -     Azithromycin; As directed  Dispense: 6 each; Refill: 0 -     predniSONE; TAKE 3 TABLETS PO QD FOR 3 DAYS THEN TAKE 2 TABLETS PO QD FOR 3 DAYS THEN  TAKE 1 TABLET PO QD FOR 3 DAYS THEN TAKE 1/2 TAB PO QD FOR 3 DAYS  Dispense: 20 tablet; Refill: 0  Acute cough -     Promethazine-DM; Take 5 mLs by mouth 4 (four) times daily as needed.  Dispense: 118 mL; Refill: 0     I discussed the assessment and treatment plan with the patient. The patient was provided an opportunity to ask questions and all were answered. The patient agreed with the plan and demonstrated an understanding of the instructions.   The patient was advised to call back or seek an in-person evaluation if the symptoms worsen or if the condition fails to improve as anticipated.  [facility] at [facility] [phone] (phone) [phone] (fax)  [place]

## 2022-07-09 ENCOUNTER — Other Ambulatory Visit: Payer: Self-pay | Admitting: Family Medicine

## 2022-07-13 ENCOUNTER — Encounter: Payer: Self-pay | Admitting: Family Medicine

## 2022-07-22 ENCOUNTER — Other Ambulatory Visit: Payer: Self-pay | Admitting: Family Medicine

## 2022-07-22 ENCOUNTER — Encounter: Payer: Self-pay | Admitting: Nurse Practitioner

## 2022-07-22 ENCOUNTER — Ambulatory Visit: Payer: Managed Care, Other (non HMO) | Admitting: Nurse Practitioner

## 2022-07-22 VITALS — BP 122/80 | HR 89 | Ht 67.0 in | Wt 224.0 lb

## 2022-07-22 DIAGNOSIS — F988 Other specified behavioral and emotional disorders with onset usually occurring in childhood and adolescence: Secondary | ICD-10-CM

## 2022-07-22 DIAGNOSIS — K219 Gastro-esophageal reflux disease without esophagitis: Secondary | ICD-10-CM | POA: Diagnosis not present

## 2022-07-22 DIAGNOSIS — Z86010 Personal history of colonic polyps: Secondary | ICD-10-CM | POA: Diagnosis not present

## 2022-07-22 NOTE — Progress Notes (Signed)
I agree with the assessment and plan as outlined by Ms. Guenther. 

## 2022-07-22 NOTE — Patient Instructions (Signed)
If doing well on Mounjaro call us in 4-6 weeks to discuss changing from pantoprazole to famotidine.

## 2022-07-22 NOTE — Telephone Encounter (Signed)
Requesting: Adderall 20mg   Contract: 12/22/21 UDS: 12/22/21 Last Visit: 06/29/22 Next Visit: None Last Refill: 05/31/22 #60 and 0RF   Please Advise

## 2022-07-22 NOTE — Progress Notes (Signed)
Assessment   52 y.o. yo female with the following:   Upper abdominal pain. Etiology unclear  ( ? 2/2 gastritis).  Resolving on bid PPI   GERD with heartburn. Improving after resumption of PPi  Mild chronic inactive gastritis.  Recent EGD with gastric biopsies negative for H.pylori.   Duodenal polyp,  submucosal duodenal nodule (suspected cyst).   Biopsies >> dilated Brunner's glands. No dysplasia.     History of colon polyps.   Several adenomatous colon polyps ranging from 3 to 10 mm in size removed Feb 2024   Plan   -She was a little drowsy after endoscopic procedures and doesn't recall all the finding so we reviewed them today -Discussed anti-reflux measures such as avoidance of late meals / bedtime snacks, HOB elevation (or use of wedge pillow), weight reduction ( if applicable)  / maintaining a healthy BMI ( body mass index),  and avoidance of trigger foods and caffeine -Starting [facility] tomorrow. We discussed possible GI side effects. -Discussed changing pantoprazole to famotidine at some point. I think it is best to wait until she has been on [facility] for a few weeks. Should she develop any upper abdominal discomfort then it would be difficult to sort out whether symptoms were related to [facility] or the change in GERD meds. She will contact us in 4-6 weeks and if doing well on Mounjaro then will change from Pantoprazole to famotidine 40 mg once daily.    History of Present Illness   Chief complaint:  follow up after EGD / colonoscopy    JADENCE LAPLACE is a 53 y.o. female known to Dr. Ardis Hughs ( currently being followed Dr. Lorenso Courier) with a past medical history of obesity , HLD, kidney stones and GERD . She has a [facility] of colon cancer. See PMH / [place] for additional details.   Rollene was seen in the office 04/21/2022 to discuss colon cancer screening, mild constipation, abdominal pain and nausea.  She was scheduled for screening colonoscopy as well as an EGD.  She was  continued on daily PPI.     Summary of procedures:   EGD showed a small hiatal hernia, gastritis, a single duodenal polyp, . submucosal nodule (suspected cyst) found in the duodenum.   Colonoscopy - four 3 to 10 mm polyps in the transverse colon, in the ascending colon and in the cecum, removed with a cold snare. Resected and retrieved. One 8 mm polyp in the sigmoid colon, removed with a cold snare. Non-bleeding internal hemorrhoids  Interval History:  Sophiana is feeling better. Pantoprazole was increase to BID after EGD.  She begin having hip pain.  She read PPIs can cause joint pain. She stopped Pantoprazole but cannot really say that hip pain improved and she got heartburn / recurrent upper abdomina pain. She resumed Pantoprazole last week and GI symptoms are improving.    Labs:     Latest Ref Rng & Units 12/22/2021    2:11 PM 06/02/2021    5:35 PM 02/10/2021    3:15 PM  Hepatic Function  Total Protein 6.0 - 8.3 g/dL 7.2  7.7  7.3   Albumin 3.5 - 5.2 g/dL 4.2  4.4  4.1   AST 0 - 37 U/L 17  17  14    ALT 0 - 35 U/L 12  14  10    Alk Phosphatase 39 - 117 U/L 106  90  89   Total Bilirubin 0.2 - 1.2 mg/dL 0.5  0.3  0.4  Latest Ref Rng & Units 12/22/2021    2:11 PM 06/02/2021    5:35 PM 02/10/2021    3:15 PM  CBC  WBC 4.0 - 10.5 K/uL 5.9  5.5  6.9   Hemoglobin 12.0 - 15.0 g/dL 12.9  13.1  12.8   Hematocrit 36.0 - 46.0 % 39.3  40.2  38.0   Platelets 150.0 - 400.0 K/uL 283.0  296.0  288       Previous GI Evaluation  EGD 05/21/22 -Normal esophagus. - Small hiatal hernia. - Gastritis. Biopsied. - A single duodenal polyp. Biopsied. - Submucosal nodule (suspected cyst) found in the duodenum. Biopsied.  Colonoscopy 05/21/22  -Four 3 to 10 mm polyps in the transverse colon, in the ascending colon and in the cecum, removed with a cold snare. Resected and retrieved. - One 8 mm polyp in the sigmoid colon, removed with a cold snare. Resected and retrieved. - Non-bleeding internal  hemorrhoids.   Diagnosis 1. Surgical [P], duodenal nodule bx - DUODENAL MUCOSA WITH SUBMUCOSAL CYSTICALLY DILATED BRUNNER'S GLANDS, NEGATIVE FOR DYSPLASIA. 2. Surgical [P], duodenal polyps bx - DUODENAL MUCOSA WITH PROMINENT BRUNNER'S GLANDS AND MILD REACTIVE/REPARATIVE CHANGE. 3. Surgical [P], gastric bx's - ANTRAL AND OXYNTIC MUCOSA WITH MILD CHRONIC INACTIVE GASTRITIS. - NO HELICOBACTER PYLORI ORGANISMS IDENTIFIED ON H&E STAINED SLIDE. 4. Surgical [P], colon, ascending polyp x1; cecal polyp x1, transverse polyp x2, polyp (4) - TUBULAR ADENOMA, FRAGMENTS. 5. Surgical [P], colon, sigmoid, polyp (1) - HYPERPLASTIC POLYP.  F/u colonoscopy recommended for 3 years.    Past Medical History:  Diagnosis Date   ADD (attention deficit disorder)    Allergy    Anxiety    Asthma    per pt- bronchitis brings on the symptoms   Bipolar depression    Chronic headaches    Chronic kidney disease    kidney stones   Constipation    Depression    Hosp 1993 [facility]---suicidal ideation   Dyspnea    GERD (gastroesophageal reflux disease)    Hyperlipidemia    no meds taken as of 04-20-16   IBS (irritable bowel syndrome)    Joint pain    Kidney infection    Lactose intolerance    Prediabetes    Ruptured disc, thoracic    Sepsis    2014-  d/t urinary infection   UTI (urinary tract infection)    Vitamin D deficiency     Past Surgical History:  Procedure Laterality Date   BREAST BIOPSY Right 2021   BREAST EXCISIONAL BIOPSY Left    as teenager   BREAST SURGERY     left breast lump removed   COLONOSCOPY     CYSTOSCOPY N/A 03/18/2015   Procedure: CYSTOSCOPY;  Surgeon: Terrance Mass, MD;  Location: [facility];  Service: Gynecology;  Laterality: N/A;   KIDNEY STONE SURGERY     LAPAROSCOPIC BILATERAL SALPINGECTOMY Bilateral 03/18/2015   Procedure: LAPAROSCOPIC BILATERAL SALPINGECTOMY;  Surgeon: Terrance Mass, MD;  Location: [facility];  Service: Gynecology;  Laterality: Bilateral;    LAPAROSCOPIC VAGINAL HYSTERECTOMY WITH SALPINGO OOPHORECTOMY N/A 03/18/2015   Procedure: LAPAROSCOPIC ASSISTED VAGINAL HYSTERECTOMY WITH SALPINGO OOPHORECTOMY;  Surgeon: Terrance Mass, MD;  Location: [facility];  Service: Gynecology;  Laterality: N/A;   LEEP  2005   LIPOMA EXCISION     MOUTH SURGERY     OTHER SURGICAL HISTORY     RENAL STONE REMOVAL   TUBAL LIGATION      Current Medications, Allergies, Family History and Social History were  reviewed in [place] record.     Current Outpatient Medications  Medication Sig Dispense Refill   amphetamine-dextroamphetamine (ADDERALL) 20 MG tablet Take 1 tablet (20 mg total) by mouth 2 (two) times daily. 60 tablet 0   fluconazole (DIFLUCAN) 150 MG tablet 1 po x1 as needed , may repeat in 3 days as needed 2 tablet 0   pantoprazole (PROTONIX) 40 MG tablet TAKE 1 TABLET (40 MG TOTAL) BY MOUTH TWICE A DAY BEFORE MEALS 180 tablet 1   Vitamin D, Ergocalciferol, (DRISDOL) 1.25 MG (50000 UNIT) CAPS capsule TAKE 1 CAPSULE (50,000 UNITS TOTAL) BY MOUTH EVERY 7 (SEVEN) DAYS 4 capsule 0   albuterol (VENTOLIN HFA) 108 (90 Base) MCG/ACT inhaler Inhale 2 puffs into the lungs every 6 (six) hours as needed for wheezing or shortness of breath. (Patient not taking: Reported on 07/22/2022) 18 g 5   amoxicillin-clavulanate (AUGMENTIN) 875-125 MG tablet Take 1 tablet by mouth 2 (two) times daily. (Patient not taking: Reported on 07/22/2022) 20 tablet 0   azithromycin (ZITHROMAX Z-PAK) 250 MG tablet As directed (Patient not taking: Reported on 07/22/2022) 6 each 0   benzonatate (TESSALON) 100 MG capsule Take 1 capsule (100 mg total) by mouth 3 (three) times daily as needed for cough. (Patient not taking: Reported on 07/22/2022) 30 capsule 0   predniSONE (DELTASONE) 10 MG tablet TAKE 3 TABLETS PO QD FOR 3 DAYS THEN TAKE 2 TABLETS PO QD FOR 3 DAYS THEN TAKE 1 TABLET PO QD FOR 3 DAYS THEN TAKE 1/2 TAB PO QD FOR 3 DAYS (Patient not taking: Reported on 07/22/2022)  20 tablet 0   promethazine-dextromethorphan (PROMETHAZINE-DM) 6.25-15 MG/5ML syrup Take 5 mLs by mouth 4 (four) times daily as needed. (Patient not taking: Reported on 07/22/2022) 118 mL 0   No current facility-administered medications for this visit.    Review of Systems: No chest pain. No shortness of breath. No urinary complaints.    Physical Exam  Wt Readings from Last 3 Encounters:  07/22/22 224 lb (101.6 kg)  05/21/22 225 lb (102.1 kg)  04/21/22 225 lb (102.1 kg)    BP 122/80   Pulse 89   Ht 5\' 7"  (1.702 m)   Wt 224 lb (101.6 kg)   LMP 12/02/2014   BMI 35.08 kg/m  Constitutional:  Pleasant, generally well appearing female in no acute distress. Psychiatric: Normal mood and affect. Behavior is normal. EENT: Pupils normal.  Conjunctivae are normal. No scleral icterus. Neck supple.  Cardiovascular: Normal rate, regular rhythm.  Pulmonary/chest: Effort normal and breath sounds normal. No wheezing, rales or rhonchi. Abdominal: Soft, nondistended, nontender. Bowel sounds active throughout. There are no masses palpable. No hepatomegaly. Neurological: Alert and oriented to person place and time.  Skin: Skin is warm and dry. No rashes noted.  Tye Savoy, NP  07/22/2022, 11:12 AM

## 2022-07-23 MED ORDER — AMPHETAMINE-DEXTROAMPHETAMINE 20 MG PO TABS: 20.0000 mg | ORAL_TABLET | Freq: Two times a day (BID) | ORAL | 0 refills | Status: DC

## 2022-08-08 ENCOUNTER — Other Ambulatory Visit: Payer: Self-pay | Admitting: Family Medicine

## 2022-09-14 ENCOUNTER — Telehealth (INDEPENDENT_AMBULATORY_CARE_PROVIDER_SITE_OTHER): Payer: Managed Care, Other (non HMO) | Admitting: Family Medicine

## 2022-09-14 ENCOUNTER — Encounter: Payer: Self-pay | Admitting: Family Medicine

## 2022-09-14 ENCOUNTER — Other Ambulatory Visit: Payer: Self-pay | Admitting: Family Medicine

## 2022-09-14 ENCOUNTER — Telehealth: Payer: Self-pay | Admitting: Family Medicine

## 2022-09-14 DIAGNOSIS — E785 Hyperlipidemia, unspecified: Secondary | ICD-10-CM | POA: Diagnosis not present

## 2022-09-14 DIAGNOSIS — F988 Other specified behavioral and emotional disorders with onset usually occurring in childhood and adolescence: Secondary | ICD-10-CM | POA: Diagnosis not present

## 2022-09-14 DIAGNOSIS — Z6833 Body mass index (BMI) 33.0-33.9, adult: Secondary | ICD-10-CM

## 2022-09-14 DIAGNOSIS — E669 Obesity, unspecified: Secondary | ICD-10-CM

## 2022-09-14 MED ORDER — TIRZEPATIDE-WEIGHT MANAGEMENT 10 MG/0.5ML ~~LOC~~ SOAJ
10.0000 mg | SUBCUTANEOUS | 0 refills | Status: DC
Start: 2022-09-14 — End: 2023-07-07

## 2022-09-14 MED ORDER — AMPHETAMINE-DEXTROAMPHETAMINE 20 MG PO TABS: 20.0000 mg | ORAL_TABLET | Freq: Two times a day (BID) | ORAL | 0 refills | Status: DC

## 2022-09-14 NOTE — Assessment & Plan Note (Signed)
Zepbound weekly Return to office 3 months  Con't diet and exercise

## 2022-09-14 NOTE — Progress Notes (Signed)
MyChart Video Visit    Virtual Visit via Video Note   This visit type was conducted due to national recommendations for restrictions regarding the COVID-19 Pandemic (e.g. social distancing) in an effort to limit this patient's exposure and mitigate transmission in our community. This patient is at least at moderate risk for complications without adequate follow up. This format is felt to be most appropriate for this patient at this time. Physical exam was limited by quality of the video and audio technology used for the visit. Windell Moulding was able to get the patient set up on a video visit.  Patient location: Home Patient and provider in visit Provider location: Office  I discussed the limitations of evaluation and management by telemedicine and the availability of in person appointments. The patient expressed understanding and agreed to proceed.  Visit Date: 09/14/2022  Today's healthcare provider: Donato Schultz, DO     Subjective:    Patient ID: Cheryl Patterson, female    DOB: [birth year], 52 y.o.   MRN: [id number]  Chief Complaint  Patient presents with   Weight Management Screening    Will like to start medication for weight loss management    HPI Patient is in today for a video visit.   She was taking 0.05 mg tirzepatide to help manage assist her weight loss. She is requesting for a prescription for it so she can afford it better. Her target weight is under 200 lb's.  Wt Readings from Last 3 Encounters:  07/22/22 224 lb (101.6 kg)  05/21/22 225 lb (102.1 kg)  04/21/22 225 lb (102.1 kg)   She is also requesting a refill on 20 mg Adderall.   Past Medical History:  Diagnosis Date   ADD (attention deficit disorder)    Allergy    Anxiety    Asthma    per pt- bronchitis brings on the symptoms   Bipolar depression (HCC)    Chronic headaches    Chronic kidney disease    kidney stones   Constipation    Depression    [facility]osp [facility]---suicidal ideation    Dyspnea    GERD (gastroesophageal reflux disease)    Hyperlipidemia    no meds taken as of 04-20-16   IBS (irritable bowel syndrome)    Joint pain    Kidney infection    Lactose intolerance    Prediabetes    Ruptured disc, thoracic    Sepsis (HCC)    2014-  d/t urinary infection   UTI (urinary tract infection)    Vitamin D deficiency     Past Surgical History:  Procedure Laterality Date   BREAST BIOPSY Right 2021   BREAST EXCISIONAL BIOPSY Left    as teenager   BREAST SURGERY     left breast lump removed   COLONOSCOPY     CYSTOSCOPY N/A 03/18/2015   Procedure: CYSTOSCOPY;  Surgeon: Ok Edwards, MD;  Location: [facility];  Service: Gynecology;  Laterality: N/A;   KIDNEY STONE SURGERY     LAPAROSCOPIC BILATERAL SALPINGECTOMY Bilateral 03/18/2015   Procedure: LAPAROSCOPIC BILATERAL SALPINGECTOMY;  Surgeon: Ok Edwards, MD;  Location: [facility];  Service: Gynecology;  Laterality: Bilateral;   LAPAROSCOPIC VAGINAL HYSTERECTOMY WITH SALPINGO OOPHORECTOMY N/A 03/18/2015   Procedure: LAPAROSCOPIC ASSISTED VAGINAL HYSTERECTOMY WITH SALPINGO OOPHORECTOMY;  Surgeon: Ok Edwards, MD;  Location: [facility];  Service: Gynecology;  Laterality: N/A;   LEEP  2005   LIPOMA EXCISION     MOUTH SURGERY  OTHER SURGICAL HISTORY     RENAL STONE REMOVAL   TUBAL LIGATION      Family History  Problem Relation Age of Onset   Colon cancer Mother 81   Cancer Mother 57       colon   Anxiety disorder Mother    Hypertension Father    Hyperlipidemia Father    Colon cancer Maternal Aunt    Cancer Maternal Aunt        colon   Uterine cancer Maternal Aunt    Brain cancer Maternal Aunt    Cancer Maternal Uncle        COLON   Breast cancer Paternal Aunt    Cancer Paternal Aunt        breast   Colon cancer Paternal Uncle    Diabetes Maternal Grandfather    Stroke Paternal Grandmother    Hypertension Paternal Grandmother    Heart disease Paternal Grandmother    Hypertension Other     Esophageal cancer Neg Hx    Rectal cancer Neg Hx    Stomach cancer Neg Hx     Social History   Socioeconomic History   Marital status: Married    Spouse name: Tim   Number of children: 2   Years of education: Not on file   Highest education level: Not on file  Occupational History   Occupation: Engineering geologist: [employer]  Tobacco Use   Smoking status: Never    Passive exposure: Never   Smokeless tobacco: Never  Vaping Use   Vaping Use: Never used  Substance and Sexual Activity   Alcohol use: No    Alcohol/week: 0.0 standard drinks of alcohol   Drug use: No   Sexual activity: Yes    Partners: Male    Birth control/protection: None  Other Topics Concern   Not on file  Social History Narrative   Exercise--walk qd                    Video--  rare   Social Determinants of Health   Financial Resource Strain: Not on file  Food Insecurity: Not on file  Transportation Needs: Not on file  Physical Activity: Not on file  Stress: Not on file  Social Connections: Not on file  Intimate Partner Violence: Not on file    Outpatient Medications Prior to Visit  Medication Sig Dispense Refill   albuterol (VENTOLIN HFA) 108 (90 Base) MCG/ACT inhaler Inhale 2 puffs into the lungs every 6 (six) hours as needed for wheezing or shortness of breath. 18 g 5   fluconazole (DIFLUCAN) 150 MG tablet 1 po x1 as needed , may repeat in 3 days as needed 2 tablet 0   pantoprazole (PROTONIX) 40 MG tablet Take 40 mg by mouth daily.     Vitamin D, Ergocalciferol, (DRISDOL) 1.25 MG (50000 UNIT) CAPS capsule TAKE 1 CAPSULE (50,000 UNITS TOTAL) BY MOUTH EVERY 7 (SEVEN) DAYS 4 capsule 0   amoxicillin-clavulanate (AUGMENTIN) 875-125 MG tablet Take 1 tablet by mouth 2 (two) times daily. 20 tablet 0   amphetamine-dextroamphetamine (ADDERALL) 20 MG tablet Take 1 tablet (20 mg total) by mouth 2 (two) times daily. 60 tablet 0   azithromycin (ZITHROMAX Z-PAK) 250 MG tablet As  directed 6 each 0   benzonatate (TESSALON) 100 MG capsule Take 1 capsule (100 mg total) by mouth 3 (three) times daily as needed for cough. 30 capsule 0   pantoprazole (PROTONIX) 40 MG tablet TAKE 1  TABLET (40 MG TOTAL) BY MOUTH TWICE A DAY BEFORE MEALS 180 tablet 1   predniSONE (DELTASONE) 10 MG tablet TAKE 3 TABLETS PO QD FOR 3 DAYS THEN TAKE 2 TABLETS PO QD FOR 3 DAYS THEN TAKE 1 TABLET PO QD FOR 3 DAYS THEN TAKE 1/2 TAB PO QD FOR 3 DAYS 20 tablet 0   promethazine-dextromethorphan (PROMETHAZINE-DM) 6.25-15 MG/5ML syrup Take 5 mLs by mouth 4 (four) times daily as needed. 118 mL 0   No facility-administered medications prior to visit.    Allergies  Allergen Reactions   Codeine     Itchy throat, whelps on skin   Other Swelling and Rash    tomatoes    Review of Systems  Constitutional:  Negative for chills, fever and malaise/fatigue.  HENT:  Negative for congestion and hearing loss.   Eyes:  Negative for discharge.  Respiratory:  Negative for cough, sputum production and shortness of breath.   Cardiovascular:  Negative for chest pain, palpitations and leg swelling.  Gastrointestinal:  Negative for abdominal pain, blood in stool, constipation, diarrhea, heartburn, nausea and vomiting.  Genitourinary:  Negative for dysuria, frequency, hematuria and urgency.  Musculoskeletal:  Negative for back pain, falls and myalgias.  Skin:  Negative for rash.  Neurological:  Negative for dizziness, sensory change, loss of consciousness, weakness and headaches.  Endo/Heme/Allergies:  Negative for environmental allergies. Does not bruise/bleed easily.  Psychiatric/Behavioral:  Negative for depression and suicidal ideas. The patient is not nervous/anxious and does not have insomnia.        Objective:    Physical Exam Vitals and nursing note reviewed.  Constitutional:      Appearance: She is well-developed.  HENT:     Head: Normocephalic and atraumatic.  Eyes:     Conjunctiva/sclera:  Conjunctivae normal.  Neck:     Thyroid: No thyromegaly.     Vascular: No carotid bruit or JVD.  Cardiovascular:     Rate and Rhythm: Normal rate and regular rhythm.     Heart sounds: Normal heart sounds. No murmur heard. Pulmonary:     Effort: Pulmonary effort is normal. No respiratory distress.     Breath sounds: Normal breath sounds. No wheezing or rales.  Chest:     Chest wall: No tenderness.  Musculoskeletal:     Cervical back: Normal range of motion and neck supple.  Neurological:     Mental Status: She is alert and oriented to person, place, and time.     LMP 12/02/2014  Wt Readings from Last 3 Encounters:  07/22/22 224 lb (101.6 kg)  05/21/22 225 lb (102.1 kg)  04/21/22 225 lb (102.1 kg)       Assessment & Plan:  Hyperlipidemia, unspecified hyperlipidemia type -     Tirzepatide-Weight Management; Inject 10 mg into the skin once a week.  Dispense: 1.5 mL; Refill: 0  Morbid obesity (HCC) -     Tirzepatide-Weight Management; Inject 10 mg into the skin once a week.  Dispense: 1.5 mL; Refill: 0  Attention deficit disorder (ADD) without hyperactivity -     Amphetamine-Dextroamphetamine; Take 1 tablet (20 mg total) by mouth 2 (two) times daily.  Dispense: 60 tablet; Refill: 0  Class 1 obesity with serious comorbidity and body mass index (BMI) of 33.0 to 33.9 in adult, unspecified obesity type Assessment & Plan: Zepbound weekly Return to office 3 months  Con't diet and exercise       I discussed the assessment and treatment plan with the patient. The patient was  provided an opportunity to ask questions and all were answered. The patient agreed with the plan and demonstrated an understanding of the instructions.   The patient was advised to call back or seek an in-person evaluation if the symptoms worsen or if the condition fails to improve as anticipated.  Donato Schultz, DO [facility] [phone]  (phone) [phone] (fax)  [facility]    I,Shehryar Baig,acting as a scribe for Donato Schultz, DO.,have documented all relevant documentation on the behalf of Donato Schultz, DO,as directed by  Donato Schultz, DO while in the presence of Donato Schultz, DO.

## 2022-09-15 NOTE — Telephone Encounter (Signed)
PA initiated via Covermymeds; KEY ;[id number]. PA denied. Plan exclusion  This medication may be excluded from the patient's benefit. For more information, please reach out to [facility] directly at [phone].

## 2022-09-21 ENCOUNTER — Other Ambulatory Visit: Payer: Self-pay | Admitting: Family Medicine

## 2022-10-14 ENCOUNTER — Other Ambulatory Visit: Payer: Self-pay | Admitting: Family Medicine

## 2022-11-15 ENCOUNTER — Other Ambulatory Visit: Payer: Self-pay | Admitting: Family Medicine

## 2022-11-15 DIAGNOSIS — F988 Other specified behavioral and emotional disorders with onset usually occurring in childhood and adolescence: Secondary | ICD-10-CM

## 2022-11-16 MED ORDER — AMPHETAMINE-DEXTROAMPHETAMINE 20 MG PO TABS: 20.0000 mg | ORAL_TABLET | Freq: Two times a day (BID) | ORAL | 0 refills | Status: DC

## 2022-11-16 NOTE — Telephone Encounter (Signed)
Requesting: Adderall 20mg   Contract: 12/22/21 UDS: 12/22/21 Last Visit: 09/14/22 Next Visit: None Last Refill: 09/14/22 #60 and 0RF   Please Advise

## 2023-01-22 ENCOUNTER — Other Ambulatory Visit: Payer: Self-pay | Admitting: Family Medicine

## 2023-01-22 DIAGNOSIS — F988 Other specified behavioral and emotional disorders with onset usually occurring in childhood and adolescence: Secondary | ICD-10-CM

## 2023-01-24 MED ORDER — AMPHETAMINE-DEXTROAMPHETAMINE 20 MG PO TABS
20.0000 mg | ORAL_TABLET | Freq: Two times a day (BID) | ORAL | 0 refills | Status: DC
Start: 2023-01-24 — End: 2023-03-27

## 2023-01-24 NOTE — Telephone Encounter (Signed)
Requesting: Adderall 20MG   Contract: 12/29/21 UDS: 12/22/21 Last Visit: 09/14/22 Next Visit: None Last Refill: 11/16/22 #60 and 0RF   Please Advise

## 2023-03-27 ENCOUNTER — Other Ambulatory Visit: Payer: Self-pay | Admitting: Family Medicine

## 2023-03-27 DIAGNOSIS — F988 Other specified behavioral and emotional disorders with onset usually occurring in childhood and adolescence: Secondary | ICD-10-CM

## 2023-03-28 ENCOUNTER — Other Ambulatory Visit: Payer: Self-pay

## 2023-03-28 ENCOUNTER — Other Ambulatory Visit (HOSPITAL_COMMUNITY): Payer: Self-pay

## 2023-03-28 MED ORDER — PANTOPRAZOLE SODIUM 40 MG PO TBEC
40.0000 mg | DELAYED_RELEASE_TABLET | Freq: Every day | ORAL | 1 refills | Status: AC
  Filled 2023-03-28: qty 90, 90d supply, fill #0

## 2023-03-28 NOTE — Telephone Encounter (Signed)
Requesting: Adderall 20 mg Contract: 12/23/2022 UDS: 12/23/2022 Last Visit: 03/30/2022 Next Visit: N/A Last Refill: 01/24/2023  Please Advise

## 2023-03-29 ENCOUNTER — Other Ambulatory Visit: Payer: Self-pay

## 2023-03-29 ENCOUNTER — Other Ambulatory Visit: Payer: Self-pay | Admitting: Family Medicine

## 2023-03-29 ENCOUNTER — Encounter: Payer: Self-pay | Admitting: Pharmacist

## 2023-03-29 DIAGNOSIS — Z1231 Encounter for screening mammogram for malignant neoplasm of breast: Secondary | ICD-10-CM

## 2023-03-29 MED ORDER — AMPHETAMINE-DEXTROAMPHETAMINE 20 MG PO TABS
20.0000 mg | ORAL_TABLET | Freq: Two times a day (BID) | ORAL | 0 refills | Status: DC
Start: 2023-03-29 — End: 2023-06-18

## 2023-04-01 ENCOUNTER — Other Ambulatory Visit: Payer: Self-pay

## 2023-04-22 ENCOUNTER — Ambulatory Visit
Admission: RE | Admit: 2023-04-22 | Discharge: 2023-04-22 | Disposition: A | Discharge status: Home / Self Care | Payer: Managed Care, Other (non HMO) | Source: Ambulatory Visit

## 2023-04-22 DIAGNOSIS — Z1231 Encounter for screening mammogram for malignant neoplasm of breast: Secondary | ICD-10-CM

## 2023-06-18 ENCOUNTER — Other Ambulatory Visit: Payer: Self-pay | Admitting: Family Medicine

## 2023-06-18 DIAGNOSIS — F988 Other specified behavioral and emotional disorders with onset usually occurring in childhood and adolescence: Secondary | ICD-10-CM

## 2023-06-20 NOTE — Telephone Encounter (Signed)
 Requesting: Adderall 20mg   Contract: 12/22/21 UDS: 12/22/21 Last Visit: 09/14/22 Next Visit: 07/07/23 Last Refill:  03/29/23 #60 and 0RF   Please Advise

## 2023-06-22 MED ORDER — AMPHETAMINE-DEXTROAMPHETAMINE 20 MG PO TABS: 20.0000 mg | ORAL_TABLET | Freq: Two times a day (BID) | ORAL | 0 refills | Status: DC

## 2023-06-23 ENCOUNTER — Encounter: Payer: Self-pay | Admitting: Family Medicine

## 2023-06-23 DIAGNOSIS — F988 Other specified behavioral and emotional disorders with onset usually occurring in childhood and adolescence: Secondary | ICD-10-CM

## 2023-06-23 MED ORDER — AMPHETAMINE-DEXTROAMPHETAMINE 20 MG PO TABS: 20.0000 mg | ORAL_TABLET | Freq: Two times a day (BID) | ORAL | 0 refills | Status: DC

## 2023-07-07 ENCOUNTER — Ambulatory Visit: Admitting: Family Medicine

## 2023-07-07 ENCOUNTER — Encounter: Payer: Self-pay | Admitting: Family Medicine

## 2023-07-07 VITALS — BP 112/80 | HR 82 | Temp 98.4°F | Resp 18 | Ht 67.0 in | Wt 204.6 lb

## 2023-07-07 DIAGNOSIS — E559 Vitamin D deficiency, unspecified: Secondary | ICD-10-CM | POA: Diagnosis not present

## 2023-07-07 DIAGNOSIS — Z0000 Encounter for general adult medical examination without abnormal findings: Secondary | ICD-10-CM | POA: Diagnosis not present

## 2023-07-07 DIAGNOSIS — R9230 Dense breasts, unspecified: Secondary | ICD-10-CM

## 2023-07-07 DIAGNOSIS — E785 Hyperlipidemia, unspecified: Secondary | ICD-10-CM

## 2023-07-07 LAB — CBC WITH DIFFERENTIAL/PLATELET
Basophils Absolute: 0 10*3/uL (ref 0.0–0.1)
Basophils Relative: 0.3 % (ref 0.0–3.0)
Eosinophils Absolute: 0.1 10*3/uL (ref 0.0–0.7)
Eosinophils Relative: 1.4 % (ref 0.0–5.0)
HCT: 39.8 % (ref 36.0–46.0)
Hemoglobin: 13.2 g/dL (ref 12.0–15.0)
Lymphocytes Relative: 29.1 % (ref 12.0–46.0)
Lymphs Abs: 1.4 10*3/uL (ref 0.7–4.0)
MCHC: 33.2 g/dL (ref 30.0–36.0)
MCV: 87.9 fl (ref 78.0–100.0)
Monocytes Absolute: 0.2 10*3/uL (ref 0.1–1.0)
Monocytes Relative: 4.2 % (ref 3.0–12.0)
Neutro Abs: 3.1 10*3/uL (ref 1.4–7.7)
Neutrophils Relative %: 65 % (ref 43.0–77.0)
Platelets: 279 10*3/uL (ref 150.0–400.0)
RBC: 4.52 Mil/uL (ref 3.87–5.11)
RDW: 13.3 % (ref 11.5–15.5)
WBC: 4.8 10*3/uL (ref 4.0–10.5)

## 2023-07-07 LAB — COMPREHENSIVE METABOLIC PANEL
ALT: 12 U/L (ref 0–35)
AST: 16 U/L (ref 0–37)
Albumin: 4.5 g/dL (ref 3.5–5.2)
Alkaline Phosphatase: 101 U/L (ref 39–117)
BUN: 13 mg/dL (ref 6–23)
CO2: 31 meq/L (ref 19–32)
Calcium: 9.9 mg/dL (ref 8.4–10.5)
Chloride: 101 meq/L (ref 96–112)
Creatinine, Ser: 0.82 mg/dL (ref 0.40–1.20)
GFR: 81.79 mL/min (ref >60.00)
Glucose, Bld: 97 mg/dL (ref 70–99)
Potassium: 4.5 meq/L (ref 3.5–5.1)
Sodium: 140 meq/L (ref 135–145)
Total Bilirubin: 0.5 mg/dL (ref 0.2–1.2)
Total Protein: 7.3 g/dL (ref 6.0–8.3)

## 2023-07-07 LAB — HEMOGLOBIN A1C: Hgb A1c MFr Bld: 6 % (ref 4.6–6.5)

## 2023-07-07 LAB — TSH: TSH: 0.91 u[IU]/mL (ref 0.35–5.50)

## 2023-07-07 LAB — LIPID PANEL
Cholesterol: 239 mg/dL — ABNORMAL HIGH (ref 0–200)
HDL: 55.9 mg/dL (ref >39.00)
LDL Cholesterol: 172 mg/dL — ABNORMAL HIGH (ref 0–99)
NonHDL: 183.59
Total CHOL/HDL Ratio: 4
Triglycerides: 60 mg/dL (ref 0.0–149.0)
VLDL: 12 mg/dL (ref 0.0–40.0)

## 2023-07-07 LAB — VITAMIN D 25 HYDROXY (VIT D DEFICIENCY, FRACTURES): VITD: 47.29 ng/mL (ref 30.00–100.00)

## 2023-07-07 MED ORDER — TIRZEPATIDE-WEIGHT MANAGEMENT 2.5 MG/0.5ML ~~LOC~~ SOLN: 2.5000 mg | SUBCUTANEOUS | 0 refills | Status: DC

## 2023-07-07 NOTE — Progress Notes (Signed)
 Established Patient Office Visit  Subjective   Patient ID: Cheryl Patterson, female    DOB: December 18, 1970  Age: 53 y.o. MRN: [id number]  Chief Complaint  Patient presents with   Annual Exam    Pt states fasting     HPI Discussed the use of AI scribe software for clinical note transcription with the patient, who gave verbal consent to proceed.  History of Present Illness   Cheryl Patterson is a 53 year old female who presents for an annual physical exam and discussion of weight loss medication.  She is interested in restarting a weight loss medication from [employer], which she discontinued in December. She has been informed of a price reduction, contingent on using her pharmacy. Despite engaging in regular exercise and dietary changes, she feels she needs additional support to achieve further weight loss.  She experiences irritation in her right breast, described as a sensation in the bone area beneath the breast, which is more pronounced when lying down. A mammogram in January revealed dense breast tissue but no other abnormalities. She is concerned due to her family history, as her aunt on her father's side had breast cancer.  She recently began using a pessary for pelvic organ prolapse, which she finds awkward and difficult to adjust to. After a day of physical strain while assisting her husband, she noticed it was not positioned correctly. She plans to follow up with her doctor in April regarding this issue.  She experiences joint discomfort, particularly in her knees and hips, which worsens after prolonged sitting or standing. She is considering trying glucosamine chondroitin and topical treatments like Salonpas patches with lidocaine and Wellnee patches for relief.  She is physically active, walking two to three times a week for 20 to 30 minutes. Her home life also provides additional physical activity due to caring for her husband. No new surgeries or changes in family history. Occasional  joint pain in her knees and hips.      Patient Active Problem List   Diagnosis Date Noted   Bronchitis 03/30/2022   Ganglion cyst of dorsum of left wrist 12/22/2021   Dyspepsia 12/22/2021   Midepigastric pain 12/22/2021   Irritable bowel syndrome with both constipation and diarrhea 06/03/2021   Lower abdominal pain 06/03/2021   Preventative health care 12/02/2020   Hot flashes 06/02/2020   Depression, major, single episode, mild (HCC) 06/02/2020   Carpal tunnel syndrome of right wrist 01/10/2020   Mild intermittent asthma 07/26/2019   Cough 10/24/2017   Class 1 obesity with serious comorbidity and body mass index (BMI) of 33.0 to 33.9 in adult 12/13/2016   Prediabetes 12/13/2016   Vitamin D deficiency 12/13/2016   Chest pain 08/23/2014   Muscle cramps 12/20/2013   Obesity (BMI 30-39.9) 11/27/2012   Hypokalemia 05/23/2012   Ureteric stone 05/20/2012   COLD SORE 01/12/2010   Attention deficit disorder 04/28/2009   NEPHROLITHIASIS, HX OF 05/31/2008   Depression 10/25/2006   Mixed hyperlipidemia 07/19/2006   NAUSEA, CHRONIC 07/19/2006   Past Medical History:  Diagnosis Date   ADD (attention deficit disorder)    Allergy    Anxiety    Asthma    per pt- bronchitis brings on the symptoms   Bipolar depression (HCC)    Chronic headaches    Chronic kidney disease    kidney stones   Constipation    Depression    Hosp 1993 [facility]---suicidal ideation   Dyspnea    GERD (gastroesophageal reflux disease)  Hyperlipidemia    no meds taken as of 04-20-16   IBS (irritable bowel syndrome)    Joint pain    Kidney infection    Lactose intolerance    Prediabetes    Ruptured disc, thoracic    Sepsis (HCC)    2014-  d/t urinary infection   UTI (urinary tract infection)    Vitamin D deficiency    Past Surgical History:  Procedure Laterality Date   BREAST BIOPSY Right 2021   BREAST EXCISIONAL BIOPSY Left    as teenager   BREAST SURGERY     left breast lump removed    COLONOSCOPY     CYSTOSCOPY N/A 03/18/2015   Procedure: CYSTOSCOPY;  Surgeon: Ok Edwards, MD;  Location: [facility];  Service: Gynecology;  Laterality: N/A;   KIDNEY STONE SURGERY     LAPAROSCOPIC BILATERAL SALPINGECTOMY Bilateral 03/18/2015   Procedure: LAPAROSCOPIC BILATERAL SALPINGECTOMY;  Surgeon: Ok Edwards, MD;  Location: [facility];  Service: Gynecology;  Laterality: Bilateral;   LAPAROSCOPIC VAGINAL HYSTERECTOMY WITH SALPINGO OOPHORECTOMY N/A 03/18/2015   Procedure: LAPAROSCOPIC ASSISTED VAGINAL HYSTERECTOMY WITH SALPINGO OOPHORECTOMY;  Surgeon: Ok Edwards, MD;  Location: [facility];  Service: Gynecology;  Laterality: N/A;   LEEP  2005   LIPOMA EXCISION     MOUTH SURGERY     OTHER SURGICAL HISTORY     RENAL STONE REMOVAL   TUBAL LIGATION     Social History   Tobacco Use   Smoking status: Never    Passive exposure: Never   Smokeless tobacco: Never  Vaping Use   Vaping status: Never Used  Substance Use Topics   Alcohol use: No    Alcohol/week: 0.0 standard drinks of alcohol   Drug use: No   Social History   Socioeconomic History   Marital status: Married    Spouse name: Tim   Number of children: 2   Years of education: Not on file   Highest education level: Not on file  Occupational History   Occupation: Engineering geologist: [employer]  Tobacco Use   Smoking status: Never    Passive exposure: Never   Smokeless tobacco: Never  Vaping Use   Vaping status: Never Used  Substance and Sexual Activity   Alcohol use: No    Alcohol/week: 0.0 standard drinks of alcohol   Drug use: No   Sexual activity: Yes    Partners: Male    Birth control/protection: None  Other Topics Concern   Not on file  Social History Narrative   Exercise--walk qd                    Video--  rare   Social Drivers of Corporate investment banker Strain: Not on file  Food Insecurity: Not on file  Transportation Needs: Not on file  Physical Activity: Not on  file  Stress: Not on file  Social Connections: Not on file  Intimate Partner Violence: Not on file   Family Status  Relation Name Status   Mother  Deceased at age 63   Father  Alive   Mat Aunt  Deceased at age 53   Mat Aunt  (Not Specified)   Mat Aunt  (Not Specified)   Nurse, mental health  (Not Specified)   [employer]  (Not Specified)   Oneal Grout  (Not Specified)   MGF  (Not Specified)   PGM  (Not Specified)   Other  (Not Specified)   Neg Hx  (  Not Specified)  No partnership data on file   Family History  Problem Relation Age of Onset   Colon cancer Mother 74   Cancer Mother 44       colon   Anxiety disorder Mother    Hypertension Father    Hyperlipidemia Father    Colon cancer Maternal Aunt    Cancer Maternal Aunt        colon   Uterine cancer Maternal Aunt    Brain cancer Maternal Aunt    Cancer Maternal Uncle        COLON   Breast cancer Paternal Aunt    Cancer Paternal Aunt        breast   Colon cancer Paternal Uncle    Diabetes Maternal Grandfather    Stroke Paternal Grandmother    Hypertension Paternal Grandmother    Heart disease Paternal Grandmother    Hypertension Other    Esophageal cancer Neg Hx    Rectal cancer Neg Hx    Stomach cancer Neg Hx    Allergies  Allergen Reactions   Codeine     Itchy throat, whelps on skin   Other Swelling and Rash    tomatoes      Review of Systems  Constitutional:  Negative for chills, fever and malaise/fatigue.  HENT:  Negative for congestion and hearing loss.   Eyes:  Negative for blurred vision and discharge.  Respiratory:  Negative for cough, sputum production and shortness of breath.   Cardiovascular:  Negative for chest pain, palpitations and leg swelling.  Gastrointestinal:  Negative for abdominal pain, blood in stool, constipation, diarrhea, heartburn, nausea and vomiting.  Genitourinary:  Negative for dysuria, frequency, hematuria and urgency.  Musculoskeletal:  Negative for back pain, falls and myalgias.  Skin:   Negative for rash.  Neurological:  Negative for dizziness, sensory change, loss of consciousness, weakness and headaches.  Endo/Heme/Allergies:  Negative for environmental allergies. Does not bruise/bleed easily.  Psychiatric/Behavioral:  Negative for depression and suicidal ideas. The patient is not nervous/anxious and does not have insomnia.       Objective:     BP 112/80 (BP Location: Left Arm, Patient Position: Sitting, Cuff Size: Normal)   Pulse 82   Temp 98.4 F (36.9 C) (Oral)   Resp 18   Ht 5\' 7"  (1.702 m)   Wt 204 lb 9.6 oz (92.8 kg)   LMP 12/02/2014   SpO2 99%   BMI 32.04 kg/m  BP Readings from Last 3 Encounters:  07/07/23 112/80  07/22/22 122/80  05/21/22 131/79   Wt Readings from Last 3 Encounters:  07/07/23 204 lb 9.6 oz (92.8 kg)  07/22/22 224 lb (101.6 kg)  05/21/22 225 lb (102.1 kg)   SpO2 Readings from Last 3 Encounters:  07/07/23 99%  05/21/22 100%  04/21/22 96%      Physical Exam Vitals and nursing note reviewed.  Constitutional:      General: She is not in acute distress.    Appearance: Normal appearance. She is well-developed.  HENT:     Head: Normocephalic and atraumatic.  Eyes:     General: No scleral icterus.       Right eye: No discharge.        Left eye: No discharge.  Cardiovascular:     Rate and Rhythm: Normal rate and regular rhythm.     Heart sounds: No murmur heard. Pulmonary:     Effort: Pulmonary effort is normal. No respiratory distress.     Breath sounds: Normal  breath sounds.  Musculoskeletal:        General: Normal range of motion.     Cervical back: Normal range of motion and neck supple.     Right lower leg: No edema.     Left lower leg: No edema.  Skin:    General: Skin is warm and dry.  Neurological:     Mental Status: She is alert and oriented to person, place, and time.  Psychiatric:        Mood and Affect: Mood normal.        Behavior: Behavior normal.        Thought Content: Thought content normal.         Judgment: Judgment normal.     No results found for any visits on 07/07/23.  Last CBC Lab Results  Component Value Date   WBC 5.9 12/22/2021   HGB 12.9 12/22/2021   HCT 39.3 12/22/2021   MCV 86.0 12/22/2021   MCH 28.6 02/10/2021   RDW 12.8 12/22/2021   PLT 283.0 12/22/2021   Last metabolic panel Lab Results  Component Value Date   GLUCOSE 83 12/22/2021   NA 136 12/22/2021   K 4.2 12/22/2021   CL 98 12/22/2021   CO2 30 12/22/2021   BUN 8 12/22/2021   CREATININE 0.80 12/22/2021   GFR 85.16 12/22/2021   CALCIUM 9.6 12/22/2021   PROT 7.2 12/22/2021   ALBUMIN 4.2 12/22/2021   LABGLOB 2.8 09/12/2018   AGRATIO 1.6 09/12/2018   BILITOT 0.5 12/22/2021   ALKPHOS 106 12/22/2021   AST 17 12/22/2021   ALT 12 12/22/2021   ANIONGAP 8 02/10/2021   Last lipids Lab Results  Component Value Date   CHOL 213 (H) 12/22/2021   HDL 43.20 12/22/2021   LDLCALC 153 (H) 12/22/2021   LDLDIRECT 144.3 06/25/2010   TRIG 88.0 12/22/2021   CHOLHDL 5 12/22/2021   Last hemoglobin A1c Lab Results  Component Value Date   HGBA1C 5.9 04/30/2019   Last thyroid functions Lab Results  Component Value Date   TSH 1.38 12/22/2021   T3TOTAL 121 09/12/2018   Last vitamin D Lab Results  Component Value Date   VD25OH 18.67 (L) 12/22/2021   Last vitamin B12 and Folate Lab Results  Component Value Date   VITAMINB12 551 12/22/2021   FOLATE 9.3 09/12/2018      The 10-year ASCVD risk score (Arnett DK, et al., 2019) is: 2.3%    Assessment & Plan:   Problem List Items Addressed This Visit       Unprioritized   Vitamin D deficiency   Relevant Orders   VITAMIN D 25 Hydroxy (Vit-D Deficiency, Fractures)   Preventative health care - Primary   Relevant Orders   CBC with Differential/Platelet   Comprehensive metabolic panel   Lipid panel   TSH   Other Visit Diagnoses       Morbid obesity (HCC)       Relevant Medications   tirzepatide (ZEPBOUND) 2.5 MG/0.5ML injection vial   Other  Relevant Orders   Insulin, random   Hemoglobin A1c     Dense breast tissue on mammogram, unspecified type       Relevant Orders   MR BREAST BILATERAL W CONTRAST     Hyperlipidemia, unspecified hyperlipidemia type         Assessment and Plan    Obesity   She discontinued weight loss medication in December and is interested in restarting it due to recent price reductions by [employer]. Despite  exercise and dietary changes, she needs additional support for weight loss. Initial doses of the medication are less expensive, starting at approximately $399, and will be provided in a vial for self-administration. The 2.5 mg dose may not result in significant weight loss, but the 5 mg dose is expected to be more effective. Initiate weight loss medication through [facility] [facility], monitor weight loss progress with initial doses, and adjust dosing as necessary upon receipt of medication.  Breast discomfort   She reports irritation and discomfort in the right breast, particularly when lying down. Dense breast tissue was confirmed by a recent mammogram. There is no family history of breast cancer, but she is concerned due to coworkers' experiences. Consider an MRI of the right breast if insurance covers it, and consult with a gynecologist if MRI is not covered.  Pelvic organ prolapse   She uses a pessary for pelvic organ prolapse but finds it uncomfortable and difficult to manage, especially during physical strain. She plans to follow up with her gynecologist to discuss the pessary's fit and effectiveness.  Osteoarthritis   She experiences joint pain in the knees and hips, likely related to osteoarthritis. She has been advised to try glucosamine chondroitin and is considering topical treatments like Salonpas patches and Wellnee patches for pain relief. Wellnee patches are herbal and designed specifically for knee pain, providing a numbing effect without burning. Recommend glucosamine chondroitin for joint  pain, suggest trying Salonpas patches with lidocaine for back pain, and consider Wellnee patches for knee pain.  General Health Maintenance   She engages in regular physical activity and has made dietary changes. She is aware of the need for vaccinations, including pneumonia and shingles vaccines, but prefers to schedule them later. Information was provided on where to obtain these vaccines without an appointment. Encourage continued physical activity and a healthy diet, and inform her about the availability of pneumonia and shingles vaccines at the pharmacy.  Follow-up   She is scheduled for lab work and will follow up in three months or sooner if needed. She is advised to contact upon receiving the weight loss medication to adjust dosing as necessary. Order lab work and schedule a follow-up appointment in three months or sooner if needed.        Return if symptoms worsen or fail to improve.    Donato Schultz, DO

## 2023-07-08 ENCOUNTER — Other Ambulatory Visit: Payer: Self-pay

## 2023-07-08 LAB — INSULIN, RANDOM: Insulin: 13.1 u[IU]/mL

## 2023-07-08 MED ORDER — TIRZEPATIDE-WEIGHT MANAGEMENT 2.5 MG/0.5ML ~~LOC~~ SOLN: 2.5000 mg | SUBCUTANEOUS | 0 refills | Status: DC

## 2023-07-20 ENCOUNTER — Encounter: Payer: Self-pay | Admitting: Family Medicine

## 2023-08-11 ENCOUNTER — Other Ambulatory Visit: Payer: Self-pay | Admitting: Family Medicine

## 2023-08-19 ENCOUNTER — Other Ambulatory Visit: Payer: Self-pay | Admitting: Family Medicine

## 2023-08-19 DIAGNOSIS — F988 Other specified behavioral and emotional disorders with onset usually occurring in childhood and adolescence: Secondary | ICD-10-CM

## 2023-08-19 NOTE — Telephone Encounter (Signed)
 Requesting: Adderall  Contract: 12/22/2021 UDS: 12/22/2021 Last OV: 07/07/2023 Next OV: 10/04/2023 Last Refill: 06/23/2023, #60--0 RF Database:   Please advise

## 2023-08-20 MED ORDER — AMPHETAMINE-DEXTROAMPHETAMINE 20 MG PO TABS: 20.0000 mg | ORAL_TABLET | Freq: Two times a day (BID) | ORAL | 0 refills | Status: DC

## 2023-10-04 ENCOUNTER — Ambulatory Visit: Admitting: Family Medicine

## 2023-10-20 ENCOUNTER — Other Ambulatory Visit: Payer: Self-pay | Admitting: Family Medicine

## 2023-10-20 ENCOUNTER — Encounter: Payer: Self-pay | Admitting: Family Medicine

## 2023-10-20 DIAGNOSIS — F988 Other specified behavioral and emotional disorders with onset usually occurring in childhood and adolescence: Secondary | ICD-10-CM

## 2023-10-20 MED ORDER — AMPHETAMINE-DEXTROAMPHETAMINE 20 MG PO TABS
20.0000 mg | ORAL_TABLET | Freq: Two times a day (BID) | ORAL | 0 refills | Status: DC
Start: 2023-10-20 — End: 2024-02-24

## 2023-10-20 NOTE — Telephone Encounter (Signed)
 Requesting: Adderall 20MG   Contract: 06/23/21 UDS: 12/22/21 Last Visit: 07/07/23 Next Visit: None Last Refill: 08/20/23 #60 and 0RF   Please Advise

## 2024-02-23 ENCOUNTER — Encounter: Payer: Self-pay | Admitting: Family Medicine

## 2024-02-23 DIAGNOSIS — F988 Other specified behavioral and emotional disorders with onset usually occurring in childhood and adolescence: Secondary | ICD-10-CM

## 2024-02-24 ENCOUNTER — Other Ambulatory Visit: Payer: Self-pay | Admitting: Family Medicine

## 2024-02-24 DIAGNOSIS — F988 Other specified behavioral and emotional disorders with onset usually occurring in childhood and adolescence: Secondary | ICD-10-CM

## 2024-02-24 MED ORDER — VITAMIN D (ERGOCALCIFEROL) 1.25 MG (50000 UNIT) PO CAPS: 50000.0000 [IU] | ORAL_CAPSULE | ORAL | 0 refills | Status: AC

## 2024-02-24 MED ORDER — AMPHETAMINE-DEXTROAMPHETAMINE 20 MG PO TABS: 20.0000 mg | ORAL_TABLET | Freq: Two times a day (BID) | ORAL | 0 refills | Status: DC

## 2024-02-24 NOTE — Telephone Encounter (Signed)
 Requesting: Adderall 20MG  Contract: 12/22/21 UDS: 12/22/21 Last Visit: 07/07/23 Next Visit: None Last Refill: 10/20/23 #60 and 0RF   Please Advise

## 2024-04-24 ENCOUNTER — Ambulatory Visit: Admitting: Family Medicine

## 2024-04-24 ENCOUNTER — Encounter: Payer: Self-pay | Admitting: Family Medicine

## 2024-04-24 ENCOUNTER — Telehealth (INDEPENDENT_AMBULATORY_CARE_PROVIDER_SITE_OTHER): Admitting: Family Medicine

## 2024-04-24 DIAGNOSIS — R051 Acute cough: Secondary | ICD-10-CM | POA: Diagnosis not present

## 2024-04-24 DIAGNOSIS — H9203 Otalgia, bilateral: Secondary | ICD-10-CM | POA: Diagnosis not present

## 2024-04-24 DIAGNOSIS — J101 Influenza due to other identified influenza virus with other respiratory manifestations: Secondary | ICD-10-CM

## 2024-04-24 MED ORDER — CEFDINIR 300 MG PO CAPS: 300.0000 mg | ORAL_CAPSULE | Freq: Two times a day (BID) | ORAL | 0 refills | Status: AC

## 2024-04-24 MED ORDER — HYDROCODONE BIT-HOMATROP MBR 5-1.5 MG/5ML PO SOLN: 5.0000 mL | Freq: Three times a day (TID) | ORAL | 0 refills | Status: AC | PRN

## 2024-04-24 MED ORDER — OSELTAMIVIR PHOSPHATE 75 MG PO CAPS: 75.0000 mg | ORAL_CAPSULE | Freq: Two times a day (BID) | ORAL | 0 refills | Status: AC

## 2024-04-24 NOTE — Progress Notes (Signed)
 "   MyChart Video Visit    Virtual Visit via Video Note   This patient is at least at moderate risk for complications without adequate follow up. This format is felt to be most appropriate for this patient at this time. Physical exam was limited by quality of the video and audio technology used for the visit. heather was able to get the patient set up on a video visit.  Patient location: home Patient and provider in visit Provider location: Office  I discussed the limitations of evaluation and management by telemedicine and the availability of in person appointments. The patient expressed understanding and agreed to proceed.  Visit Date: 04/24/2024  Today's healthcare provider: Jamee JONELLE Antonio Cyndee, DO     Subjective:    Patient ID: Cheryl Patterson, female    DOB: 10-15-1970, 54 y.o.   MRN: [id number]  Chief Complaint  Patient presents with   Ear Pain    HPI Patient is in today for flu symptoms.  Discussed the use of AI scribe software for clinical note transcription with the patient, who gave verbal consent to proceed.  History of Present Illness Cheryl Patterson is a 54 year old female who presents with ear pain and flu-like symptoms.  She has been experiencing bilateral ear pain and general malaise since Saturday, accompanied by lightheadedness. She is concerned that she may have an ear infection.  Symptoms began on Saturday, including a fever peaking at 100.8F and a cough causing a burning sensation in the sternum. No gastrointestinal symptoms are present.  Her grandson tested positive for influenza A on Saturday, and she believes she may have contracted the same virus. She took naproxen for a headache on Thursday, before her current symptoms began.  Her husband is hospitalized with kidney issues, and she is eager to recover to visit him. She has been communicating with hospital staff by phone as her sister-in-law, who initially visited, also became ill.  She reports  experiencing fever, cough, and lightheadedness. No gastrointestinal symptoms.    Past Medical History:  Diagnosis Date   ADD (attention deficit disorder)    Allergy    Anxiety    Asthma    per pt- bronchitis brings on the symptoms   Bipolar depression (HCC)    Chronic headaches    Chronic kidney disease    kidney stones   Constipation    Depression    [facility]osp [facility]---suicidal ideation   Dyspnea    GERD (gastroesophageal reflux disease)    Hyperlipidemia    no meds taken as of 04-20-16   IBS (irritable bowel syndrome)    Joint pain    Kidney infection    Lactose intolerance    Prediabetes    Ruptured disc, thoracic    Sepsis (HCC)    2014-  d/t urinary infection   UTI (urinary tract infection)    Vitamin D  deficiency     Past Surgical History:  Procedure Laterality Date   BREAST BIOPSY Right 2021   BREAST EXCISIONAL BIOPSY Left    as teenager   BREAST SURGERY     left breast lump removed   COLONOSCOPY     CYSTOSCOPY N/A 03/18/2015   Procedure: CYSTOSCOPY;  Surgeon: Curlee VEAR Guan, MD;  Location: [facility];  Service: Gynecology;  Laterality: N/A;   KIDNEY STONE SURGERY     LAPAROSCOPIC BILATERAL SALPINGECTOMY Bilateral 03/18/2015   Procedure: LAPAROSCOPIC BILATERAL SALPINGECTOMY;  Surgeon: Curlee VEAR Guan, MD;  Location: [facility];  Service:  Gynecology;  Laterality: Bilateral;   LAPAROSCOPIC VAGINAL HYSTERECTOMY WITH SALPINGO OOPHORECTOMY N/A 03/18/2015   Procedure: LAPAROSCOPIC ASSISTED VAGINAL HYSTERECTOMY WITH SALPINGO OOPHORECTOMY;  Surgeon: Curlee VEAR Guan, MD;  Location: [facility];  Service: Gynecology;  Laterality: N/A;   LEEP  2005   LIPOMA EXCISION     MOUTH SURGERY     OTHER SURGICAL HISTORY     RENAL STONE REMOVAL   TUBAL LIGATION      Family History  Problem Relation Age of Onset   Colon cancer Mother 34   Cancer Mother 43       colon   Anxiety disorder Mother    Hypertension Father    Hyperlipidemia Father    Colon cancer Maternal Aunt     Cancer Maternal Aunt        colon   Uterine cancer Maternal Aunt    Brain cancer Maternal Aunt    Cancer Maternal Uncle        COLON   Breast cancer Paternal Aunt    Cancer Paternal Aunt        breast   Colon cancer Paternal Uncle    Diabetes Maternal Grandfather    Stroke Paternal Grandmother    Hypertension Paternal Grandmother    Heart disease Paternal Grandmother    Hypertension Other    Esophageal cancer Neg Hx    Rectal cancer Neg Hx    Stomach cancer Neg Hx     Social History   Socioeconomic History   Marital status: Married    Spouse name: Tim   Number of children: 2   Years of education: Not on file   Highest education level: Not on file  Occupational History   Occupation: Engineering Geologist: [employer]  Tobacco Use   Smoking status: Never    Passive exposure: Never   Smokeless tobacco: Never  Vaping Use   Vaping status: Never Used  Substance and Sexual Activity   Alcohol use: No    Alcohol/week: 0.0 standard drinks of alcohol   Drug use: No   Sexual activity: Yes    Partners: Male    Birth control/protection: None  Other Topics Concern   Not on file  Social History Narrative   Exercise--walk qd                    Video--  rare   Social Drivers of Health   Tobacco Use: Low Risk (04/24/2024)   Patient History    Smoking Tobacco Use: Never    Smokeless Tobacco Use: Never    Passive Exposure: Never  Financial Resource Strain: Not on file  Food Insecurity: Not on file  Transportation Needs: Not on file  Physical Activity: Not on file  Stress: Not on file  Social Connections: Not on file  Intimate Partner Violence: Not on file  Depression (PHQ2-9): Low Risk (07/07/2023)   Depression (PHQ2-9)    PHQ-2 Score: 0  Alcohol Screen: Not on file  Housing: Not on file  Utilities: Not on file  Health Literacy: Not on file    Outpatient Medications Prior to Visit  Medication Sig Dispense Refill    amphetamine -dextroamphetamine  (ADDERALL) 20 MG tablet Take 1 tablet (20 mg total) by mouth 2 (two) times daily. 60 tablet 0   pantoprazole  (PROTONIX ) 40 MG tablet Take 1 tablet (40 mg total) by mouth daily. 90 tablet 1   rosuvastatin  (CRESTOR ) 20 MG tablet TAKE 1 TABLET (20 MG TOTAL) BY MOUTH DAILY. NEEDS  LABS FOR REFILLS 90 tablet 0   tirzepatide  (ZEPBOUND ) 2.5 MG/0.5ML injection vial INJECT 0.5 ML (2.5 MG) UNDER THE SKIN ONCE WEEKLY (0.5ML= 50 UNITS) 2 mL 2   Vitamin D , Ergocalciferol , (DRISDOL ) 1.25 MG (50000 UNIT) CAPS capsule Take 1 capsule (50,000 Units total) by mouth every 7 (seven) days. 4 capsule 0   No facility-administered medications prior to visit.    Allergies  Allergen Reactions   Codeine     Itchy throat, whelps on skin   Other Swelling and Rash    tomatoes    Review of Systems  Constitutional:  Positive for chills, fever and malaise/fatigue.  HENT:  Positive for congestion.   Eyes:  Negative for blurred vision.  Respiratory:  Positive for cough. Negative for shortness of breath.   Cardiovascular:  Negative for chest pain, palpitations and leg swelling.  Gastrointestinal:  Negative for vomiting.  Musculoskeletal:  Negative for back pain.  Skin:  Negative for rash.  Neurological:  Negative for loss of consciousness and headaches.       Objective:    Physical Exam Constitutional:      Appearance: Normal appearance. She is ill-appearing.  Pulmonary:     Effort: Pulmonary effort is normal.  Neurological:     General: No focal deficit present.     Mental Status: She is alert and oriented to person, place, and time.  Psychiatric:        Mood and Affect: Mood normal.        Thought Content: Thought content normal.     LMP 12/02/2014  Wt Readings from Last 3 Encounters:  07/07/23 204 lb 9.6 oz (92.8 kg)  07/22/22 224 lb (101.6 kg)  05/21/22 225 lb (102.1 kg)       Assessment & Plan:  Influenza A -     Oseltamivir  Phosphate; Take 1 capsule (75 mg total)  by mouth 2 (two) times daily.  Dispense: 10 capsule; Refill: 0  Otalgia of both ears -     Cefdinir ; Take 1 capsule (300 mg total) by mouth 2 (two) times daily.  Dispense: 20 capsule; Refill: 0 -     HYDROcodone  Bit-Homatrop MBr; Take 5 mLs by mouth every 8 (eight) hours as needed for cough.  Dispense: 120 mL; Refill: 0  Acute cough     I discussed the assessment and treatment plan with the patient. The patient was provided an opportunity to ask questions and all were answered. The patient agreed with the plan and demonstrated an understanding of the instructions.   The patient was advised to call back or seek an in-person evaluation if the symptoms worsen or if the condition fails to improve as anticipated.  Jamee JONELLE Antonio Cyndee, DO [place]  [facility] [phone] (phone) [phone] (fax)  [facility]   "

## 2024-05-04 ENCOUNTER — Encounter: Payer: Self-pay | Admitting: Family Medicine

## 2024-05-04 DIAGNOSIS — F988 Other specified behavioral and emotional disorders with onset usually occurring in childhood and adolescence: Secondary | ICD-10-CM

## 2024-05-07 MED ORDER — AMPHETAMINE-DEXTROAMPHETAMINE 20 MG PO TABS: 20.0000 mg | ORAL_TABLET | Freq: Two times a day (BID) | ORAL | 0 refills | Status: AC

## 2024-05-07 NOTE — Telephone Encounter (Signed)
 Requesting: Adderall  Contract: 12/22/2021 UDS: 12/22/2021 Last OV: 07/07/2023 Next OV: 07/12/2024 Last Refill: 02/24/2024, #60--0 RF Database:   Please advise

## 2024-07-12 ENCOUNTER — Encounter: Admitting: Family Medicine
